# Patient Record
Sex: Female | Born: 1945 | Race: White | Hispanic: No | Smoking: Never smoker
Health system: Southern US, Community
[De-identification: ages and names within clinical notes are randomized; demographics above are authoritative.]

## PROBLEM LIST (undated history)

## (undated) DIAGNOSIS — M51369 Other intervertebral disc degeneration, lumbar region without mention of lumbar back pain or lower extremity pain: Secondary | ICD-10-CM

## (undated) DIAGNOSIS — M549 Dorsalgia, unspecified: Secondary | ICD-10-CM

## (undated) DIAGNOSIS — E119 Type 2 diabetes mellitus without complications: Secondary | ICD-10-CM

## (undated) DIAGNOSIS — R51 Headache: Secondary | ICD-10-CM

## (undated) DIAGNOSIS — M5136 Other intervertebral disc degeneration, lumbar region: Secondary | ICD-10-CM

## (undated) DIAGNOSIS — I4891 Unspecified atrial fibrillation: Secondary | ICD-10-CM

## (undated) DIAGNOSIS — M5134 Other intervertebral disc degeneration, thoracic region: Secondary | ICD-10-CM

## (undated) DIAGNOSIS — M1990 Unspecified osteoarthritis, unspecified site: Secondary | ICD-10-CM

## (undated) DIAGNOSIS — J45909 Unspecified asthma, uncomplicated: Secondary | ICD-10-CM

## (undated) DIAGNOSIS — M5030 Other cervical disc degeneration, unspecified cervical region: Secondary | ICD-10-CM

## (undated) DIAGNOSIS — E785 Hyperlipidemia, unspecified: Secondary | ICD-10-CM

## (undated) DIAGNOSIS — G43909 Migraine, unspecified, not intractable, without status migrainosus: Secondary | ICD-10-CM

## (undated) DIAGNOSIS — I10 Essential (primary) hypertension: Secondary | ICD-10-CM

## (undated) DIAGNOSIS — I350 Nonrheumatic aortic (valve) stenosis: Secondary | ICD-10-CM

## (undated) DIAGNOSIS — G8929 Other chronic pain: Secondary | ICD-10-CM

## (undated) DIAGNOSIS — R569 Unspecified convulsions: Secondary | ICD-10-CM

## (undated) DIAGNOSIS — R011 Cardiac murmur, unspecified: Secondary | ICD-10-CM

## (undated) DIAGNOSIS — I4510 Unspecified right bundle-branch block: Secondary | ICD-10-CM

## (undated) DIAGNOSIS — R519 Headache, unspecified: Secondary | ICD-10-CM

## (undated) HISTORY — PX: TONSILLECTOMY: SUR1361

## (undated) HISTORY — PX: DIAGNOSTIC LAPAROSCOPY: SUR761

## (undated) HISTORY — DX: Essential (primary) hypertension: I10

## (undated) HISTORY — DX: Nonrheumatic aortic (valve) stenosis: I35.0

## (undated) HISTORY — PX: VAGINAL HYSTERECTOMY: SUR661

## (undated) HISTORY — DX: Hyperlipidemia, unspecified: E78.5

## (undated) HISTORY — DX: Unspecified right bundle-branch block: I45.10

## (undated) HISTORY — DX: Unspecified atrial fibrillation: I48.91

---

## 1991-07-28 HISTORY — PX: DIAGNOSTIC LAPAROSCOPY: SUR761

## 1997-07-27 HISTORY — PX: VAGINAL HYSTERECTOMY: SUR661

## 1998-01-15 ENCOUNTER — Other Ambulatory Visit: Admission: RE | Admit: 1998-01-15 | Discharge: 1998-01-15 | Payer: Self-pay | Admitting: Obstetrics and Gynecology

## 1998-04-30 ENCOUNTER — Inpatient Hospital Stay (HOSPITAL_COMMUNITY): Admission: RE | Admit: 1998-04-30 | Discharge: 1998-05-02 | Payer: Self-pay | Admitting: Obstetrics and Gynecology

## 1998-11-13 ENCOUNTER — Encounter: Payer: Self-pay | Admitting: *Deleted

## 1998-11-13 ENCOUNTER — Inpatient Hospital Stay (HOSPITAL_COMMUNITY): Admission: EM | Admit: 1998-11-13 | Discharge: 1998-11-15 | Payer: Self-pay | Admitting: Internal Medicine

## 1998-11-21 ENCOUNTER — Ambulatory Visit (HOSPITAL_COMMUNITY): Admission: RE | Admit: 1998-11-21 | Discharge: 1998-11-21 | Payer: Self-pay | Admitting: *Deleted

## 1999-09-09 ENCOUNTER — Other Ambulatory Visit: Admission: RE | Admit: 1999-09-09 | Discharge: 1999-09-09 | Payer: Self-pay | Admitting: Obstetrics and Gynecology

## 2000-04-15 ENCOUNTER — Encounter: Admission: RE | Admit: 2000-04-15 | Discharge: 2000-07-14 | Payer: Self-pay | Admitting: Anesthesiology

## 2000-09-14 ENCOUNTER — Other Ambulatory Visit: Admission: RE | Admit: 2000-09-14 | Discharge: 2000-09-14 | Payer: Self-pay | Admitting: Obstetrics and Gynecology

## 2006-03-05 ENCOUNTER — Encounter: Admission: RE | Admit: 2006-03-05 | Discharge: 2006-03-05 | Payer: Self-pay | Admitting: Family Medicine

## 2007-06-15 ENCOUNTER — Encounter: Admission: RE | Admit: 2007-06-15 | Discharge: 2007-06-15 | Payer: Self-pay | Admitting: Family Medicine

## 2008-09-14 ENCOUNTER — Encounter: Admission: RE | Admit: 2008-09-14 | Discharge: 2008-09-14 | Payer: Self-pay | Admitting: Family Medicine

## 2009-10-04 ENCOUNTER — Encounter: Admission: RE | Admit: 2009-10-04 | Discharge: 2009-10-04 | Payer: Self-pay | Admitting: Family Medicine

## 2010-11-03 ENCOUNTER — Other Ambulatory Visit: Payer: Self-pay | Admitting: Family Medicine

## 2010-11-03 DIAGNOSIS — Z1231 Encounter for screening mammogram for malignant neoplasm of breast: Secondary | ICD-10-CM

## 2010-11-18 ENCOUNTER — Ambulatory Visit
Admission: RE | Admit: 2010-11-18 | Discharge: 2010-11-18 | Disposition: A | Discharge status: Home / Self Care | Payer: Medicare Other | Source: Ambulatory Visit | Attending: Family Medicine | Admitting: Family Medicine

## 2010-11-18 DIAGNOSIS — Z1231 Encounter for screening mammogram for malignant neoplasm of breast: Secondary | ICD-10-CM

## 2010-12-12 NOTE — Procedures (Signed)
[facility]  Patient:    Julia Rose, Julia Rose                MRN: [id number] Proc. Date: 04/16/00 Adm. Date:  [id number] Attending:  Thyra Breed CC:         Jearld Adjutant, M.D.   Procedure Report  PREOPERATIVE DIAGNOSIS:  Cervical spondylosis.  POSTOPERATIVE DIAGNOSIS:  Cervical spondylosis.  PROCEDURE:  Cervical epidural steroid injection.  SURGEON:  Thyra Breed, M.D.  INTERVAL HISTORY:  The patient is a very pleasant 65 year old who was sent to [place] by Dr. Doristine Section for a cervical epidural steroid injection.  The patient states that she was in her usual state of health up until about six or seven years ago, when she noted the gradual onset of neck discomfort which gradually progressed to include discomfort into her shoulders and upper arms with decreased strength, and numbness and tingling on the volar aspect of both arms, and into the hands with the right being worse than the left.  At some point, she apparently had some nerve conduction studies which suggested carpal tunnel syndrome.  Years ago, she was treated with cervical traction and it improved for a while.  She had an MRI done six to seven years ago which showed two level disk bulges and facet joint arthropathy, and more recently had a repeat MRI done after seeing Dr. Renae Fickle which showed C4-5 moderate broad-based spondylitic protrusion with mild foraminal stenosis, C5-6 with broad-base with effaces the dural sac and impinges on the anterior aspect of the cervical cord, C6-7 broad-based spondylitic protrusion.  The patient in addition to the neck and shoulder discomfort has developed fairly significant problems with headaches for which she uses Midrin.  She has been tried on Fiorinal and prednisone in the past with no overall improvement, and was on ________ up until recently.  She described the pain as a nagging, deep aching discomfort which is made worse by lifting, vacuuming,  sweeping, looking up, or carrying objects above her head.  She notes that rest and relaxation would produce her discomfort.  More recently, she has been placed on Neurontin for the past three weeks, and notes a 25% reduction in her pain with much of the burning discomfort going away.  She has been wearing a soft cervical brace at night which has helped to reduce some of her discomfort. She had used cervical traction up until a couple of years ago, and has not used this recently.  She has been on Celebrex, which she does not feel is very helpful.  CURRENT MEDICATIONS:  Midrin, Neurontin, Paxil, Premarin, and phenobarbital for seizures, Allegra, hydrochlorothiazide, Celebrex, and Caltrate with multivitamins.  ALLERGIES:  DILANTIN AND FLEXERIL.  FAMILY HISTORY:  Positive for hypertension, coronary artery disease, and diabetes.  PAST SURGICAL HISTORY:  Significant for tonsillectomy and adenoidectomy, hysterectomy, and laparoscopic surgery for paraovarian cyst.  SOCIAL HISTORY:  The patient is a nonsmoker, rare drinker, and works as a Firefighter for _______.  ACTIVE MEDICAL PROBLEMS:  Include migraine headaches, depression, osteoarthritis, allergic rhinitis, and seizure disorder.  REVIEW OF SYSTEMS:  General negative.  Head - see HPI.  Eyes significant for corrective lens.  Nose, mouth, and throat significant for allergic rhinitis symptoms.  Ears significant for scar tissue.  Pulmonary negative. Cardiovascular negative except for intermittent edema for which she takes her hydrochlorothiazide.  GI - she has some mild constipation.  GU negative. Musculoskeletal and neurologic see HPI.  The patient does have a history of seizure  disorder for which she is on phenobarbital by her primary care physician.  Hematologic negative.  Cutaneous negative.  Endocrine negative. Psychiatric significant for depression.  Allergy and immunologic significant for allergic rhinitis.  PHYSICAL EXAMINATION:   Blood pressure 130/72, heart rate 76, respiratory rate 14, O2 saturations 96%.  Pain level is 5 out of 10.  Temperature is 97.5.  In general, this is a very pleasant female in no acute distress.  Head was normocephalic and atraumatic.  Eyes - extraocular movements intact with conjunctivae and sclerae clear.  Conjunctivae and sclerae clear.  Nose patent and nares without discharge.  Oropharynx is free of lesions.  Neck was significant for 20-30 degrees of extension with intact forward flexion and rotation to right and left, as well as lateral flexion.  Spurlings sign was negative.  Carotids are 2+ and symmetric without bruits.  There is no lymphadenopathy.  Lungs were clear.  Heart was regular rate and rhythm. Breasts, abdomen, pelvic, and rectal exams were not performed.  The patient demonstrated no cyanosis, clubbing, or edema, with radial pulse, and dorsalis pedis pulses 2+ and symmetric.  Neurologic - the patient was oriented x 4. Cranial nerves 2-12 are grossly intact.  Deep tendon reflexes were 2+ and symmetric in the upper and lower extremity with downgoing toes.  Motor was 5/5 with symmetric bulk and tone.  Sensory was intact to pinprick and vibratory sense.  Coordination was grossly intact.  IMPRESSION: 1. 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 2. Multiple other medical problems per primary care physician which include    depression, seizure disorder, migraine headaches, osteoarthritis, allergic    rhinitis, mild constipation.  DISPOSITION:  I discussed with the patient in detail the potential risks and benefit ratio of a cervical epidural steroid injection.  She is aware of the side effects of corticosteroids.  DESCRIPTION OF PROCEDURE:  After informed consent was obtained, the patient was placed in the right lateral decubitus position and monitored.  Her neck was prepped with Betadine x 3.  The skin level was raised at the  C7-T1 interspace with 1% lidocaine.  A 20-gauge Tuohy needle was introduced into the  cervical epidural space with a loss of resistance to preservative-free normal saline.  There was no CSF nor blood.  Forty milligrams of Medrol and 3 cc of preservative-free normal saline was gently injected.  The needle was flushed with preservative-free normal saline and removed intact.  POSTPROCEDURE CONDITION:  Stable.  DISCHARGE INSTRUCTIONS: 1. Resume previous diet. 2. Limitation and activities per instruction sheet. 3. Continue on current medications. 4. The patient was advised that current literature supports the benefits of    only one cervical epidural steroid injection.  There is very little added    benefit to doing more than one in a series contrary to what is done in the    lumbar epidural series.  I discussed this with the patient.  I will see the    patient back at Dr. Philipp Ovens request, but anticipate that she will probably    get optimal benefits from the single shot and would likely need to come    back in six months if she does get a positive benefit. DD:  04/16/00 TD:  04/19/00 Job: [id number]

## 2012-04-04 ENCOUNTER — Other Ambulatory Visit: Payer: Self-pay | Admitting: Family Medicine

## 2012-04-04 DIAGNOSIS — Z1231 Encounter for screening mammogram for malignant neoplasm of breast: Secondary | ICD-10-CM

## 2012-04-22 ENCOUNTER — Ambulatory Visit: Payer: Medicare Other

## 2012-04-25 ENCOUNTER — Ambulatory Visit
Admission: RE | Admit: 2012-04-25 | Discharge: 2012-04-25 | Disposition: A | Discharge status: Home / Self Care | Payer: Medicare Other | Source: Ambulatory Visit | Attending: Family Medicine | Admitting: Family Medicine

## 2012-04-25 DIAGNOSIS — Z1231 Encounter for screening mammogram for malignant neoplasm of breast: Secondary | ICD-10-CM

## 2013-06-16 ENCOUNTER — Other Ambulatory Visit: Payer: Self-pay

## 2013-06-16 DIAGNOSIS — Z1231 Encounter for screening mammogram for malignant neoplasm of breast: Secondary | ICD-10-CM

## 2013-07-13 ENCOUNTER — Ambulatory Visit
Admission: RE | Admit: 2013-07-13 | Discharge: 2013-07-13 | Disposition: A | Discharge status: Home / Self Care | Payer: Medicare Other | Source: Ambulatory Visit

## 2013-07-13 DIAGNOSIS — Z1231 Encounter for screening mammogram for malignant neoplasm of breast: Secondary | ICD-10-CM

## 2014-04-09 ENCOUNTER — Other Ambulatory Visit: Payer: Self-pay | Admitting: Family Medicine

## 2014-04-09 DIAGNOSIS — E2839 Other primary ovarian failure: Secondary | ICD-10-CM

## 2014-04-27 ENCOUNTER — Other Ambulatory Visit: Payer: Medicare Other

## 2014-05-22 ENCOUNTER — Ambulatory Visit
Admission: RE | Admit: 2014-05-22 | Discharge: 2014-05-22 | Disposition: A | Discharge status: Home / Self Care | Payer: Medicare Other | Source: Ambulatory Visit | Attending: Family Medicine | Admitting: Family Medicine

## 2014-05-22 DIAGNOSIS — E2839 Other primary ovarian failure: Secondary | ICD-10-CM

## 2014-06-19 ENCOUNTER — Other Ambulatory Visit: Payer: Self-pay

## 2014-06-19 DIAGNOSIS — Z1231 Encounter for screening mammogram for malignant neoplasm of breast: Secondary | ICD-10-CM

## 2014-07-31 ENCOUNTER — Ambulatory Visit
Admission: RE | Admit: 2014-07-31 | Discharge: 2014-07-31 | Disposition: A | Discharge status: Home / Self Care | Payer: Medicare Other | Source: Ambulatory Visit

## 2014-07-31 DIAGNOSIS — Z1231 Encounter for screening mammogram for malignant neoplasm of breast: Secondary | ICD-10-CM

## 2014-12-13 ENCOUNTER — Other Ambulatory Visit: Payer: Self-pay | Admitting: General Surgery

## 2014-12-13 DIAGNOSIS — K439 Ventral hernia without obstruction or gangrene: Secondary | ICD-10-CM

## 2014-12-18 ENCOUNTER — Ambulatory Visit
Admission: RE | Admit: 2014-12-18 | Discharge: 2014-12-18 | Disposition: A | Discharge status: Home / Self Care | Payer: Medicare Other | Source: Ambulatory Visit | Attending: General Surgery | Admitting: General Surgery

## 2014-12-18 MED ORDER — IOHEXOL 300 MG/ML  SOLN
100.0000 mL | Freq: Once | INTRAMUSCULAR | Status: AC | PRN
  Administered 2014-12-18: 100 mL via INTRAVENOUS

## 2015-05-03 ENCOUNTER — Telehealth: Payer: Self-pay | Admitting: Cardiovascular Disease

## 2015-05-03 NOTE — Telephone Encounter (Signed)
05/03/2015 Received incoming faxed records for upcoming appointment on 06/14/2015 with Dr. Allyson Sabal from [facility].  Records given to [facility]. cbr

## 2015-06-12 ENCOUNTER — Ambulatory Visit: Payer: Medicare Other | Admitting: Cardiovascular Disease

## 2015-07-02 ENCOUNTER — Ambulatory Visit: Payer: Medicare Other | Admitting: Cardiovascular Disease

## 2015-07-09 ENCOUNTER — Encounter: Payer: Self-pay | Admitting: Cardiovascular Disease

## 2015-07-09 ENCOUNTER — Ambulatory Visit (INDEPENDENT_AMBULATORY_CARE_PROVIDER_SITE_OTHER): Payer: Medicare Other | Admitting: Cardiovascular Disease

## 2015-07-09 ENCOUNTER — Telehealth: Payer: Self-pay | Admitting: Cardiovascular Disease

## 2015-07-09 DIAGNOSIS — R0789 Other chest pain: Secondary | ICD-10-CM | POA: Diagnosis not present

## 2015-07-09 DIAGNOSIS — R079 Chest pain, unspecified: Secondary | ICD-10-CM | POA: Insufficient documentation

## 2015-07-09 DIAGNOSIS — I4510 Unspecified right bundle-branch block: Secondary | ICD-10-CM | POA: Insufficient documentation

## 2015-07-09 DIAGNOSIS — R011 Cardiac murmur, unspecified: Secondary | ICD-10-CM

## 2015-07-09 DIAGNOSIS — R002 Palpitations: Secondary | ICD-10-CM | POA: Diagnosis not present

## 2015-07-09 DIAGNOSIS — I10 Essential (primary) hypertension: Secondary | ICD-10-CM

## 2015-07-09 DIAGNOSIS — I481 Persistent atrial fibrillation: Secondary | ICD-10-CM

## 2015-07-09 DIAGNOSIS — E119 Type 2 diabetes mellitus without complications: Secondary | ICD-10-CM | POA: Insufficient documentation

## 2015-07-09 DIAGNOSIS — R0989 Other specified symptoms and signs involving the circulatory and respiratory systems: Secondary | ICD-10-CM | POA: Diagnosis not present

## 2015-07-09 DIAGNOSIS — I480 Paroxysmal atrial fibrillation: Secondary | ICD-10-CM | POA: Insufficient documentation

## 2015-07-09 DIAGNOSIS — E785 Hyperlipidemia, unspecified: Secondary | ICD-10-CM | POA: Insufficient documentation

## 2015-07-09 LAB — BASIC METABOLIC PANEL
BUN: 15 mg/dL (ref 7–25)
CO2: 27 mmol/L (ref 20–31)
Calcium: 10.1 mg/dL (ref 8.6–10.4)
Chloride: 100 mmol/L (ref 98–110)
Creat: 0.85 mg/dL (ref 0.50–0.99)
GLUCOSE: 98 mg/dL (ref 65–99)
Potassium: 4.3 mmol/L (ref 3.5–5.3)
SODIUM: 141 mmol/L (ref 135–146)

## 2015-07-09 MED ORDER — EDOXABAN TOSYLATE 30 MG PO TABS: 30.0000 mg | ORAL_TABLET | Freq: Every day | ORAL | Status: DC

## 2015-07-09 NOTE — Patient Instructions (Signed)
Medication Instructions:  Your physician has recommended you make the following change in your medication:  1) START Savaysa 30 mg by mouth ONCE daily   Labwork: Your physician recommends that you return for lab work in: BMET The lab can be found on the FIRST FLOOR of out building in [address]  Testing/Procedures: Your physician has requested that you have an echocardiogram. Echocardiography is a painless test that uses sound waves to create images of your heart. It provides your doctor with information about the size and shape of your heart and how well your heart's chambers and valves are working. This procedure takes approximately one hour. There are no restrictions for this procedure.  Your physician has requested that you have a carotid duplex. This test is an ultrasound of the carotid arteries in your neck. It looks at blood flow through these arteries that supply the brain with blood. Allow one hour for this exam. There are no restrictions or special instructions.  Your physician has requested that you have a lexiscan myoview. For further information please visit [email or web address]. Please follow instruction sheet, as given.  Your physician has recommended that you wear an event monitor. Event monitors are medical devices that record the heart's electrical activity. Doctors most often us these monitors to diagnose arrhythmias. Arrhythmias are problems with the speed or rhythm of the heartbeat. The monitor is a small, portable device. You can wear one while you do your normal daily activities. This is usually used to diagnose what is causing palpitations/syncope (passing out). - 2 weeks    Follow-Up: Your physician recommends that you schedule a follow-up appointment in: with Dr. Allyson SabalBerry 1 week after competition of event monitor    Any Other Special Instructions Will Be Listed Below (If Applicable).     If you need a refill on your cardiac medications before your next appointment,  please call your pharmacy.

## 2015-07-09 NOTE — Assessment & Plan Note (Signed)
History of hypertension with blood pressure measured at 136/90. She is on chlorothiazide and losartan. Continue current meds at current dosing

## 2015-07-09 NOTE — Assessment & Plan Note (Signed)
Mrs. Julia Rose has newly recognized A. Fib with ventricular response in the 90s. She is relatively asymptomatic from this.Her CHA2DSVASC2 score is  4. I'm going to begin her on Eliquis  oral antibiotic regulation after checking her renal function. .I'm also going to obtain a 2-D echocardiogram.

## 2015-07-09 NOTE — Assessment & Plan Note (Signed)
History of hyperlipidemia intolerant to statin drugs 

## 2015-07-09 NOTE — Telephone Encounter (Signed)
Received call from Julia Rose with [facility] calling to report AFib with a rate of 110 BPM.Dr.Berry aware patient has AFib.

## 2015-07-09 NOTE — Progress Notes (Signed)
07/09/2015 [name]   Jun 21, 1946  [id number]  Primary Physician Kaleen Mask, MD Primary Cardiologist: Runell Gess MD [name]   HPI:  Mrs. Hoggard is a 69 year old moderately overweight married Caucasian female mother of 4 children, grandmother of a grandchildren whose husband [name] is also a patient of mine. She was referred by Dr. Jeannetta Nap for evaluation of recently recognized atrial fibrillation. Her cardiac risk factor profile is notable for treated hypertension, diabetes. She has hyperlipidemia intolerant to statin drugs. She's never had a heart stroke. She occasionally gets chest tightness. 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 She was recently found to be in A. Fib during a routine doctor's visit and was referred here for further evaluation.   Current Outpatient Prescriptions  Medication Sig Dispense Refill  . Aspirin-Acetaminophen-Caffeine (EXCEDRIN PO) Take 1 tablet by mouth daily.    . hydrochlorothiazide (HYDRODIURIL) 25 MG tablet Take 25 mg by mouth daily.    Marland Kitchen ibuprofen (ADVIL,MOTRIN) 200 MG tablet Take 200 mg by mouth every 6 (six) hours as needed. TAKE 1-3 TABLETS PRN.    Marland Kitchen losartan (COZAAR) 100 MG tablet Take 50 mg by mouth at bedtime.  0  . metFORMIN (GLUMETZA) 1000 MG (MOD) 24 hr tablet Take 1,000 mg by mouth daily with breakfast.    . PARoxetine (PAXIL) 40 MG tablet Take 40 mg by mouth daily.  0  . PHENobarbital (LUMINAL) 97.2 MG tablet Take 1 tablet by mouth at bedtime.  4   No current facility-administered medications for this visit.    Allergies  Allergen Reactions  . Dilantin [Phenytoin] Hives    Whelps all over for two weeks    Social History   Social History  . Marital Status: Married    Spouse Name: N/A  . Number of Children: N/A  . Years of Education: N/A   Occupational History  . Not on file.   Social History Main Topics  . Smoking status: Never Smoker   . Smokeless tobacco:  Never Used  . Alcohol Use: No  . Drug Use: No  . Sexual Activity: Not on file   Other Topics Concern  . Not on file   Social History Narrative  . No narrative on file     Review of Systems: General: negative for chills, fever, night sweats or weight changes.  Cardiovascular: negative for chest pain, dyspnea on exertion, edema, orthopnea, palpitations, paroxysmal nocturnal dyspnea or shortness of breath Dermatological: negative for rash Respiratory: negative for cough or wheezing Urologic: negative for hematuria Abdominal: negative for nausea, vomiting, diarrhea, bright red blood per rectum, melena, or hematemesis Neurologic: negative for visual changes, syncope, or dizziness All other systems reviewed and are otherwise negative except as noted above.    Blood pressure 136/90, pulse 95, height  (1.575 m), weight 187 lb (84.823 kg).  General appearance: alert and no distress Neck: no adenopathy, no JVD, supple, symmetrical, trachea midline, thyroid not enlarged, symmetric, no tenderness/mass/nodules and soft bilateral carotid bruits Lungs: clear to auscultation bilaterally Heart: irregularly irregular rhythm Extremities: extremities normal, atraumatic, no cyanosis or edema  EKG a total fibrillation with a ventricular response of 95 and right bundle branch block. I personally reviewed this EKG  ASSESSMENT AND PLAN:   Atrial fibrillation [facility]) Mrs. Hazelett has newly recognized A. Fib with ventricular response in the 90s. She is relatively asymptomatic from this.Her CHA2DSVASC2 score is  4. I'm going to begin her on Eliquis  oral antibiotic regulation after checking  her renal function. .I'm also going to obtain a 2-D echocardiogram.  Chest pain Review of intermittent chest pressure with positive risk factors and newly recognized A. Fib. I'm going to obtain a formal neurologic Myoview stress test for risk stratification.  Hyperlipidemia History of hyperlipidemia intolerant to  statin drugs  Essential hypertension History of hypertension with blood pressure measured at 136/90. She is on chlorothiazide and losartan. Continue current meds at current dosing      Runell GessJonathan J. Elisabel Hanover MD [facility] HospitalFACP,FACC,FAHA, [facility] CenterFSCAI 07/09/2015 12:21 PM

## 2015-07-09 NOTE — Assessment & Plan Note (Signed)
Review of intermittent chest pressure with positive risk factors and newly recognized A. Fib. I'm going to obtain a formal neurologic Myoview stress test for risk stratification.

## 2015-07-10 DIAGNOSIS — R002 Palpitations: Secondary | ICD-10-CM | POA: Diagnosis not present

## 2015-07-12 ENCOUNTER — Telehealth: Payer: Self-pay | Admitting: Cardiovascular Disease

## 2015-07-12 NOTE — Telephone Encounter (Signed)
Received call for HHR A Fib alert - autotrigger rate of 116 BPM w/ no symptoms recorded.  Awaiting fax - have not received this yet.  Pt had HHR autotrigger event on day of monitor initiation 12/13. Same day of OV.  New onset of A Fib already noted by Dr. Allyson SabalBerry. She is on anticoagulation.  Routing to Dr. Antoine PocheHochrein ([employer]) for any recommendations.

## 2015-07-15 ENCOUNTER — Encounter: Payer: Self-pay | Admitting: Cardiovascular Disease

## 2015-07-15 NOTE — Telephone Encounter (Signed)
I will forward to Dr. Allyson SabalBerry.

## 2015-07-16 ENCOUNTER — Telehealth: Payer: Self-pay | Admitting: *Deleted

## 2015-07-16 NOTE — Telephone Encounter (Signed)
Pt has atrial fib with RVR on monitor, per dr berry, pt will need to see the atrial fib clinic. Pt aware Julia Rose will call to schedule the appt. Left message for Julia Rose at the atrial fib clinic to call and schedule the appt.

## 2015-07-16 NOTE — Telephone Encounter (Signed)
We already know that she is in AFIB

## 2015-07-17 NOTE — Telephone Encounter (Signed)
Per verbal orders on 12/20 from Dr. Allyson SabalBerry; pt setup with A. Fib clinic on 12/27 @ 1:30pm.

## 2015-07-23 ENCOUNTER — Ambulatory Visit (HOSPITAL_COMMUNITY)
Admission: RE | Admit: 2015-07-23 | Discharge: 2015-07-23 | Disposition: A | Discharge status: Home / Self Care | Payer: Medicare Other | Source: Ambulatory Visit | Attending: Nurse Practitioner | Admitting: Nurse Practitioner

## 2015-07-23 ENCOUNTER — Encounter (HOSPITAL_COMMUNITY): Payer: Self-pay | Admitting: Nurse Practitioner

## 2015-07-23 VITALS — BP 138/72 | HR 83 | Ht 62.0 in | Wt 192.8 lb

## 2015-07-23 DIAGNOSIS — I480 Paroxysmal atrial fibrillation: Secondary | ICD-10-CM

## 2015-07-23 DIAGNOSIS — I10 Essential (primary) hypertension: Secondary | ICD-10-CM | POA: Insufficient documentation

## 2015-07-23 MED ORDER — METOPROLOL TARTRATE 25 MG PO TABS: ORAL_TABLET | ORAL | Status: DC

## 2015-07-23 NOTE — Progress Notes (Signed)
Patient ID: Julia Rose, female   DOB: 01-29-1946, 69 y.o.   MRN: [id number]     Primary Care Physician: Kaleen Mask, MD Referring Physician: Dr. Matt Holmes SCOTTIE Julia Rose is a 69 y.o. female with a recent h/o of PAF. She is referred by Dr. Allyson Sabal for further evaluation. She is pending an echo and a stress test. The palpitations come and go usually daily but do not last that long. She had been started on edoxaban 30 mg daily for a chadsvasc score of 4. She is aware of fatigue, shortness of breath and chest heaviness when in afib. 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   Today, she denies symptoms of palpitations, chest pain, shortness of breath, orthopnea, PND, lower extremity edema, dizziness, presyncope, syncope, or neurologic sequela. The patient is tolerating medications without difficulties and is otherwise without complaint today.   Past Medical History  Diagnosis Date  . Atrial fibrillation (HCC)   . Right bundle branch block   . Hypertension   . Hyperlipidemia     statin intolerant  . Diabetes (HCC)    No past surgical history on file.  Current Outpatient Prescriptions  Medication Sig Dispense Refill  . edoxaban (SAVAYSA) 30 MG TABS tablet Take 1 tablet (30 mg total) by mouth daily. 90 tablet 3  . hydrochlorothiazide (HYDRODIURIL) 25 MG tablet Take 25 mg by mouth daily.    Marland Kitchen losartan (COZAAR) 100 MG tablet Take 50 mg by mouth at bedtime.  0  . metFORMIN (GLUMETZA) 1000 MG (MOD) 24 hr tablet Take 1,000 mg by mouth daily with breakfast.    . PARoxetine (PAXIL) 40 MG tablet Take 40 mg by mouth daily.  0  . PHENobarbital (LUMINAL) 97.2 MG tablet Take 1 tablet by mouth at bedtime.  4  . metoprolol tartrate (LOPRESSOR) 25 MG tablet Take 1 tablet every 4-6 hours AS NEEDED for afib episode if HR >100 30 tablet 1   No current facility-administered medications for this encounter.    Allergies  Allergen Reactions  . Dilantin [Phenytoin] Hives    Whelps all over for  two weeks    Social History   Social History  . Marital Status: Married    Spouse Name: N/A  . Number of Children: N/A  . Years of Education: N/A   Occupational History  . Not on file.   Social History Main Topics  . Smoking status: Never Smoker   . Smokeless tobacco: Never Used  . Alcohol Use: No  . Drug Use: No  . Sexual Activity: Not on file   Other Topics Concern  . Not on file   Social History Narrative    Family History  Problem Relation Age of Onset  . Kidney disease Mother   . Diabetes Mother   . Dementia Mother   . Hypertension Mother     ROS- All systems are reviewed and negative except as per the HPI above  Physical Exam: Filed Vitals:   07/23/15 1326  BP: 138/72  Pulse: 83  Height:  (1.575 m)  Weight: 192 lb 12.8 oz (87.454 kg)    GEN- The patient is well appearing, alert and oriented x 3 today.   Head- normocephalic, atraumatic Eyes-  Sclera clear, conjunctiva pink Ears- hearing intact Oropharynx- clear Neck- supple, no JVP, soft bruits, rt louder than left,? Possible radiation of systolic murmur Lymph- no cervical lymphadenopathy Lungs- Clear to ausculation bilaterally, normal work of breathing Heart- Regular rate and rhythm, 2/6 systolic murmur  rubs or gallops, PMI not laterally displaced GI- soft, NT, ND, + BS Extremities- no clubbing, cyanosis, or edema MS- no significant deformity or atrophy Skin- no rash or lesion Psych- euthymic mood, full affect Neuro- strength and sensation are intact  EKG- NSR, RBBB, Pr int 134 ms, QRS int 146 ms, QTc 477 ms Epic records reviewed  Assessment and Plan: 1.PAF Metoprolol tartrate 25 mg pill in pocket given for episodes of fast heart rate Pt is currently on Edoxaban 30 mg daily, crcl calculated at 85.9. I do not see a clear  indication for dose adjustment as the usual dose would be 60 mg a day. I will message Dr. Allyson SabalBerry to clarify. His note actually said he planned to start eliquis. 2D  echo/myoview pending  2. HTN Stable  F/u in 2 weeks after results of tests are in, for further ratre/rhythm management.  Elvina Sidleonna C. Matthew Folksarroll, ANP-C Afib Clinic [facility] [place] [address] [place], [place] [address] [phone]

## 2015-07-23 NOTE — Patient Instructions (Signed)
Your physician has recommended you make the following change in your medication:  1)Metoprolol (lopressor) 25mg  -- take 1 tablet every 4-6 hours AS NEEDED for afib episode for heart rate greater than 100

## 2015-07-24 ENCOUNTER — Ambulatory Visit (INDEPENDENT_AMBULATORY_CARE_PROVIDER_SITE_OTHER): Payer: Medicare Other

## 2015-07-24 DIAGNOSIS — R002 Palpitations: Secondary | ICD-10-CM | POA: Diagnosis not present

## 2015-07-26 ENCOUNTER — Telehealth (HOSPITAL_COMMUNITY): Payer: Self-pay

## 2015-07-26 NOTE — Telephone Encounter (Signed)
Encounter complete. 

## 2015-07-31 ENCOUNTER — Ambulatory Visit (HOSPITAL_COMMUNITY)
Admission: RE | Admit: 2015-07-31 | Discharge: 2015-07-31 | Disposition: A | Discharge status: Home / Self Care | Payer: Medicare Other | Source: Ambulatory Visit | Attending: Cardiovascular Disease | Admitting: Cardiovascular Disease

## 2015-07-31 ENCOUNTER — Ambulatory Visit (HOSPITAL_BASED_OUTPATIENT_CLINIC_OR_DEPARTMENT_OTHER)
Admission: RE | Admit: 2015-07-31 | Discharge: 2015-07-31 | Disposition: A | Discharge status: Home / Self Care | Payer: Medicare Other | Source: Ambulatory Visit | Attending: Cardiovascular Disease | Admitting: Cardiovascular Disease

## 2015-07-31 DIAGNOSIS — E785 Hyperlipidemia, unspecified: Secondary | ICD-10-CM | POA: Diagnosis not present

## 2015-07-31 DIAGNOSIS — R011 Cardiac murmur, unspecified: Secondary | ICD-10-CM | POA: Diagnosis not present

## 2015-07-31 DIAGNOSIS — I4510 Unspecified right bundle-branch block: Secondary | ICD-10-CM | POA: Diagnosis not present

## 2015-07-31 DIAGNOSIS — R002 Palpitations: Secondary | ICD-10-CM

## 2015-07-31 DIAGNOSIS — E119 Type 2 diabetes mellitus without complications: Secondary | ICD-10-CM | POA: Diagnosis not present

## 2015-07-31 DIAGNOSIS — I4891 Unspecified atrial fibrillation: Secondary | ICD-10-CM | POA: Insufficient documentation

## 2015-07-31 DIAGNOSIS — R0989 Other specified symptoms and signs involving the circulatory and respiratory systems: Secondary | ICD-10-CM | POA: Diagnosis not present

## 2015-07-31 DIAGNOSIS — I10 Essential (primary) hypertension: Secondary | ICD-10-CM | POA: Diagnosis not present

## 2015-07-31 DIAGNOSIS — R0789 Other chest pain: Secondary | ICD-10-CM

## 2015-07-31 LAB — MYOCARDIAL PERFUSION IMAGING
CHL CUP NUCLEAR SSS: 5
LV dias vol: 68 mL
LVSYSVOL: 18 mL
NUC STRESS TID: 1.34
Peak HR: 102 {beats}/min
Rest HR: 69 {beats}/min
SDS: 3
SRS: 2

## 2015-07-31 MED ORDER — REGADENOSON 0.4 MG/5ML IV SOLN
0.4000 mg | Freq: Once | INTRAVENOUS | Status: AC
  Administered 2015-07-31: 0.4 mg via INTRAVENOUS

## 2015-07-31 MED ORDER — TECHNETIUM TC 99M SESTAMIBI GENERIC - CARDIOLITE
10.9000 | Freq: Once | INTRAVENOUS | Status: AC | PRN
  Administered 2015-07-31: 10.9 via INTRAVENOUS

## 2015-07-31 MED ORDER — TECHNETIUM TC 99M SESTAMIBI GENERIC - CARDIOLITE
32.2000 | Freq: Once | INTRAVENOUS | Status: AC | PRN
  Administered 2015-07-31: 32.2 via INTRAVENOUS

## 2015-07-31 MED ORDER — AMINOPHYLLINE 25 MG/ML IV SOLN
75.0000 mg | Freq: Once | INTRAVENOUS | Status: AC
  Administered 2015-07-31: 75 mg via INTRAVENOUS

## 2015-08-02 ENCOUNTER — Ambulatory Visit: Payer: Medicare Other | Admitting: Cardiovascular Disease

## 2015-08-07 ENCOUNTER — Telehealth: Payer: Self-pay

## 2015-08-07 DIAGNOSIS — R011 Cardiac murmur, unspecified: Secondary | ICD-10-CM

## 2015-08-07 NOTE — Telephone Encounter (Signed)
-----   Message from Runell GessJonathan J Berry, MD sent at 07/31/2015  1:18 PM EST ----- Normal LV systolic function, mild to moderate aortic stenosis. Repeat 12 months

## 2015-08-09 ENCOUNTER — Encounter (HOSPITAL_COMMUNITY): Payer: Self-pay | Admitting: Nurse Practitioner

## 2015-08-09 ENCOUNTER — Ambulatory Visit (HOSPITAL_COMMUNITY)
Admission: RE | Admit: 2015-08-09 | Discharge: 2015-08-09 | Disposition: A | Discharge status: Home / Self Care | Payer: Medicare Other | Source: Ambulatory Visit | Attending: Nurse Practitioner | Admitting: Nurse Practitioner

## 2015-08-09 VITALS — BP 132/84 | HR 78 | Ht 62.0 in | Wt 193.6 lb

## 2015-08-09 DIAGNOSIS — I10 Essential (primary) hypertension: Secondary | ICD-10-CM | POA: Diagnosis not present

## 2015-08-09 DIAGNOSIS — I480 Paroxysmal atrial fibrillation: Secondary | ICD-10-CM

## 2015-08-09 MED ORDER — METOPROLOL TARTRATE 25 MG PO TABS: 12.5000 mg | ORAL_TABLET | Freq: Every day | ORAL | Status: DC

## 2015-08-09 MED ORDER — EDOXABAN TOSYLATE 60 MG PO TABS: 30.0000 mg | ORAL_TABLET | Freq: Every day | ORAL | Status: DC

## 2015-08-09 NOTE — Progress Notes (Signed)
Patient ID: Julia Rose, female   DOB: 09/20/1945, 70 y.o.   MRN: [id number]     Primary Care Physician: Kaleen Mask, MD Referring Physician: Dr. Matt Holmes Julia Rose is a 70 y.o. female with a recent h/o of PAF. She is referred by Dr. Allyson Sabal for further evaluation. She is pending an echo and a stress test. The palpitations come and go usually daily but do not last that long. She had been started on edoxaban 30 mg daily for a chadsvasc score of 4. She is aware of fatigue, shortness of breath and chest heaviness when in afib. 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   Today, on f/u in afib clinic, she reports one sustained afib episode for several hours, a few smaller ones.  Overall, not bothering her enough to consider an antiarrythmic, but may do better if bb daily. She is very conservative toward medicine.contiuing endoxaban using this due to being most compatible  with pentaobarbital, but for her kidney function should be on 60 mg daily.  Today, she denies symptoms of palpitations, chest pain, shortness of breath, orthopnea, PND, lower extremity edema, dizziness, presyncope, syncope, or neurologic sequela. The patient is tolerating medications without difficulties and is otherwise without complaint today.   Past Medical History  Diagnosis Date  . Atrial fibrillation (HCC)   . Right bundle branch block   . Hypertension   . Hyperlipidemia     statin intolerant  . Diabetes (HCC)    No past surgical history on file.  Current Outpatient Prescriptions  Medication Sig Dispense Refill  . edoxaban (SAVAYSA) 60 MG TABS tablet Take 30 mg by mouth daily. 30 tablet 6  . hydrochlorothiazide (HYDRODIURIL) 25 MG tablet Take 25 mg by mouth daily.    Marland Kitchen losartan (COZAAR) 100 MG tablet Take 50 mg by mouth at bedtime.  0  . metFORMIN (GLUMETZA) 1000 MG (MOD) 24 hr tablet Take 1,000 mg by mouth daily with breakfast.    . metoprolol tartrate (LOPRESSOR) 25 MG tablet Take 0.5 tablets (12.5  mg total) by mouth daily. 30 tablet 1  . PARoxetine (PAXIL) 40 MG tablet Take 40 mg by mouth daily.  0  . PHENobarbital (LUMINAL) 97.2 MG tablet Take 1 tablet by mouth at bedtime.  4   No current facility-administered medications for this encounter.    Allergies  Allergen Reactions  . Dilantin [Phenytoin] Hives    Whelps all over for two weeks  . Statins Other (See Comments)    Social History   Social History  . Marital Status: Married    Spouse Name: N/A  . Number of Children: N/A  . Years of Education: N/A   Occupational History  . Not on file.   Social History Main Topics  . Smoking status: Never Smoker   . Smokeless tobacco: Never Used  . Alcohol Use: No  . Drug Use: No  . Sexual Activity: Not on file   Other Topics Concern  . Not on file   Social History Narrative    Family History  Problem Relation Age of Onset  . Kidney disease Mother   . Diabetes Mother   . Dementia Mother   . Hypertension Mother     ROS- All systems are reviewed and negative except as per the HPI above  Physical Exam: Filed Vitals:   08/09/15 1039  BP: 132/84  Pulse: 78  Height: 5\' 2"  (1.575 m)  Weight: 193 lb 9.6 oz (87.816 kg)    GEN-  The patient is well appearing, alert and oriented x 3 today.   Head- normocephalic, atraumatic Eyes-  Sclera clear, conjunctiva pink Ears- hearing intact Oropharynx- clear Neck- supple, no JVP, soft bruits, rt louder than left,? Possible radiation of systolic murmur Lymph- no cervical lymphadenopathy Lungs- Clear to ausculation bilaterally, normal work of breathing Heart- Regular rate and rhythm, 2/6 systolic murmur rubs or gallops, PMI not laterally displaced GI- soft, NT, ND, + BS Extremities- no clubbing, cyanosis, or edema MS- no significant deformity or atrophy Skin- no rash or lesion Psych- euthymic mood, full affect Neuro- strength and sensation are intact  EKG- NSR, RBBB, Pr int 134 ms, QRS int 146 ms, QTc 477 ms Epic records  reviewed  Assessment and Plan: 1.PAF Metoprolol tartrate, 1/2 tab daily and can be taken as extra dose by 1/2 to 1 pill for episodes of afib. Pt is currently on Edoxaban 30 mg daily, crcl calculated at 85.9.By kidney function she should be on 60 mg a day and I checked with two separate pharmacists to confirm. Will send in new rx. 2D echo/myoview pending  2. HTN Stable  F/u in march  Donna C. Matthew Folksarroll, ANP-C [facility] [address] [place], [place] [address] [phone]

## 2015-08-09 NOTE — Patient Instructions (Addendum)
Your physician has recommended you make the following change in your medication:  1) Savaysa 60mg  once a day 2)Metoprolol 12.5mg  (1/2 tablet) once a day

## 2015-08-28 ENCOUNTER — Ambulatory Visit (INDEPENDENT_AMBULATORY_CARE_PROVIDER_SITE_OTHER): Payer: Medicare Other | Admitting: Cardiovascular Disease

## 2015-08-28 ENCOUNTER — Encounter: Payer: Self-pay | Admitting: Cardiovascular Disease

## 2015-08-28 VITALS — BP 110/66 | HR 66 | Ht 62.0 in | Wt 192.3 lb

## 2015-08-28 DIAGNOSIS — E785 Hyperlipidemia, unspecified: Secondary | ICD-10-CM

## 2015-08-28 DIAGNOSIS — I480 Paroxysmal atrial fibrillation: Secondary | ICD-10-CM

## 2015-08-28 DIAGNOSIS — I10 Essential (primary) hypertension: Secondary | ICD-10-CM

## 2015-08-28 DIAGNOSIS — R079 Chest pain, unspecified: Secondary | ICD-10-CM | POA: Diagnosis not present

## 2015-08-28 DIAGNOSIS — R002 Palpitations: Secondary | ICD-10-CM | POA: Diagnosis not present

## 2015-08-28 DIAGNOSIS — I350 Nonrheumatic aortic (valve) stenosis: Secondary | ICD-10-CM

## 2015-08-28 MED ORDER — WARFARIN SODIUM 5 MG PO TABS: 5.0000 mg | ORAL_TABLET | Freq: Every day | ORAL | Status: DC

## 2015-08-28 NOTE — Assessment & Plan Note (Signed)
History of chest pain with recent Myoview that was low risk and nonischemic

## 2015-08-28 NOTE — Assessment & Plan Note (Signed)
Paroxysmal atrial fibrillation with recent event monitor showing ventricular response as high as 170. She was started on oral anticoagulation. She has right bundle branch block. LV function was normal. She is on low-dose beta blocker. Today she is in sinus rhythm at 63. Continue current medications

## 2015-08-28 NOTE — Assessment & Plan Note (Signed)
History of hyperlipidemia intolerant to statin therapy 

## 2015-08-28 NOTE — Assessment & Plan Note (Signed)
History of hypertension blood pressure measured at 110/66. She is on hydrochlorothiazide, losartan and Lopressor. Continue current meds at current dosing

## 2015-08-28 NOTE — Progress Notes (Signed)
08/28/2015 Julia Rose   [birth year]  [id number]  Primary Physician Kaleen Mask, MD Primary Cardiologist: Runell Gess MD [name]   HPI:  Julia Rose is a 70 year old moderately overweight married Caucasian female mother of 4 children, grandmother of a grandchildren whose husband Julia Rose is also a patient of mine. She was referred by Dr. Jeannetta Nap for evaluation of recently recognized atrial fibrillation. Cholesterol her in the office 07/09/15. Her cardiac risk factor profile is notable for treated hypertension, diabetes. She has hyperlipidemia intolerant to statin drugs. She's never had a heart stroke. She occasionally gets chest tightness. 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 She was recently found to be in A. Fib during a routine doctor's visit and was referred here for further evaluation. 8. Event monitor showed A. Fib with heart rates as high as 170. 2-D echo revealed normal LV systolic function with mild to moderate aortic stenosis and a Myoview stress test was low risk and nonischemic. Carotid Doppler showed no evidence of ICA stenosis. She was placed on oral anticoagulation. Today she is in sinus rhythm.   Current Outpatient Prescriptions  Medication Sig Dispense Refill  . edoxaban (SAVAYSA) 60 MG TABS tablet Take 30 mg by mouth daily. 30 tablet 6  . hydrochlorothiazide (HYDRODIURIL) 25 MG tablet Take 25 mg by mouth daily.    Marland Kitchen ibuprofen (ADVIL,MOTRIN) 200 MG tablet Take 200 mg by mouth every 6 (six) hours as needed for moderate pain.    Marland Kitchen losartan (COZAAR) 100 MG tablet Take 50 mg by mouth at bedtime.  0  . metFORMIN (GLUMETZA) 1000 MG (MOD) 24 hr tablet Take 1,000 mg by mouth daily with breakfast.    . metoprolol tartrate (LOPRESSOR) 25 MG tablet Take 0.5 tablets (12.5 mg total) by mouth daily. 30 tablet 1  . PHENobarbital (LUMINAL) 97.2 MG tablet Take 1 tablet by mouth at bedtime.  4  . PARoxetine (PAXIL) 40 MG tablet Take 40  mg by mouth daily.  0   No current facility-administered medications for this visit.    Allergies  Allergen Reactions  . Dilantin [Phenytoin] Hives    Whelps all over for two weeks  . Statins Other (See Comments)    dizzy    Social History   Social History  . Marital Status: Married    Spouse Name: N/A  . Number of Children: N/A  . Years of Education: N/A   Occupational History  . Not on file.   Social History Main Topics  . Smoking status: Never Smoker   . Smokeless tobacco: Never Used  . Alcohol Use: No  . Drug Use: No  . Sexual Activity: Not on file   Other Topics Concern  . Not on file   Social History Narrative     Review of Systems: General: negative for chills, fever, night sweats or weight changes.  Cardiovascular: negative for chest pain, dyspnea on exertion, edema, orthopnea, palpitations, paroxysmal nocturnal dyspnea or shortness of breath Dermatological: negative for rash Respiratory: negative for cough or wheezing Urologic: negative for hematuria Abdominal: negative for nausea, vomiting, diarrhea, bright red blood per rectum, melena, or hematemesis Neurologic: negative for visual changes, syncope, or dizziness All other systems reviewed and are otherwise negative except as noted above.    Blood pressure 110/66, pulse 66, height  (1.575 m), weight 192 lb 4.8 oz (87.227 kg).  General appearance: alert and no distress Neck: no adenopathy, no JVD, supple, symmetrical, trachea midline, thyroid not enlarged, symmetric,  no tenderness/mass/nodules and soft bilateral carotid bruits versus transmitted murmur Lungs: clear to auscultation bilaterally Heart: 2/6 systolic ejection murmur consistent with aortic stenosis Extremities: extremities normal, atraumatic, no cyanosis or edema  EKG normal sinus rhythm at 63 with total branch block. I personally reviewed this EKG  ASSESSMENT AND PLAN:   Essential hypertension History of hypertension blood  pressure measured at 110/66. She is on hydrochlorothiazide, losartan and Lopressor. Continue current meds at current dosing  Hyperlipidemia History of hyperlipidemia intolerant to statin therapy  Chest pain History of chest pain with recent Myoview that was low risk and nonischemic  Aortic stenosis, moderate Recent 2-D echo performed because of A. Fib and a murmur revealed normal LV systolic function with moderate aortic stenosis. The peak gradient was 34 mmHg with a mean gradient of 21.  Atrial fibrillation (HCC) Paroxysmal atrial fibrillation with recent event monitor showing ventricular response as high as 170. She was started on oral anticoagulation. She has right bundle branch block. LV function was normal. She is on low-dose beta blocker. Today she is in sinus rhythm at 63. Continue current medications      Runell Gess MD [facility], [facility] 08/28/2015 11:18 AM

## 2015-08-28 NOTE — Patient Instructions (Addendum)
Medication Instructions:  Your physician has recommended you make the following change in your medication:  1) START Coumadin 5 mg by mouth ONCE daily   Labwork: none  Testing/Procedures: none  Follow-Up: Your physician recommends that you schedule a follow-up appointment: with Dr. Jeannetta Nap - PCP to follow your Coumadin   Your physician wants you to follow-up in: 6 months with Dr. Allyson Sabal. You will receive a reminder letter in the mail two months in advance. If you don't receive a letter, please call our office to schedule the follow-up appointment.   Any Other Special Instructions Will Be Listed Below (If Applicable).     If you need a refill on your cardiac medications before your next appointment, please call your pharmacy.

## 2015-08-28 NOTE — Assessment & Plan Note (Signed)
Recent 2-D echo performed because of A. Fib and a murmur revealed normal LV systolic function with moderate aortic stenosis. The peak gradient was 34 mmHg with a mean gradient of 21.

## 2015-09-21 ENCOUNTER — Other Ambulatory Visit: Payer: Self-pay | Admitting: Cardiovascular Disease

## 2015-10-07 ENCOUNTER — Encounter (HOSPITAL_COMMUNITY): Payer: Self-pay | Admitting: Nurse Practitioner

## 2015-10-07 ENCOUNTER — Other Ambulatory Visit (HOSPITAL_COMMUNITY): Payer: Self-pay | Admitting: Nurse Practitioner

## 2015-10-07 ENCOUNTER — Ambulatory Visit (HOSPITAL_COMMUNITY)
Admission: RE | Admit: 2015-10-07 | Discharge: 2015-10-07 | Disposition: A | Discharge status: Home / Self Care | Payer: Medicare Other | Source: Ambulatory Visit | Attending: Nurse Practitioner | Admitting: Nurse Practitioner

## 2015-10-07 VITALS — BP 120/68 | HR 93 | Ht 62.0 in | Wt 190.2 lb

## 2015-10-07 DIAGNOSIS — Z79899 Other long term (current) drug therapy: Secondary | ICD-10-CM | POA: Diagnosis not present

## 2015-10-07 DIAGNOSIS — I350 Nonrheumatic aortic (valve) stenosis: Secondary | ICD-10-CM | POA: Diagnosis not present

## 2015-10-07 DIAGNOSIS — I480 Paroxysmal atrial fibrillation: Secondary | ICD-10-CM | POA: Insufficient documentation

## 2015-10-07 DIAGNOSIS — I10 Essential (primary) hypertension: Secondary | ICD-10-CM | POA: Insufficient documentation

## 2015-10-07 DIAGNOSIS — Z7984 Long term (current) use of oral hypoglycemic drugs: Secondary | ICD-10-CM | POA: Diagnosis not present

## 2015-10-07 DIAGNOSIS — Z841 Family history of disorders of kidney and ureter: Secondary | ICD-10-CM | POA: Diagnosis not present

## 2015-10-07 DIAGNOSIS — E785 Hyperlipidemia, unspecified: Secondary | ICD-10-CM | POA: Diagnosis not present

## 2015-10-07 DIAGNOSIS — Z888 Allergy status to other drugs, medicaments and biological substances status: Secondary | ICD-10-CM | POA: Diagnosis not present

## 2015-10-07 DIAGNOSIS — Z8249 Family history of ischemic heart disease and other diseases of the circulatory system: Secondary | ICD-10-CM | POA: Diagnosis not present

## 2015-10-07 DIAGNOSIS — Z7901 Long term (current) use of anticoagulants: Secondary | ICD-10-CM | POA: Diagnosis not present

## 2015-10-07 DIAGNOSIS — E119 Type 2 diabetes mellitus without complications: Secondary | ICD-10-CM | POA: Diagnosis not present

## 2015-10-07 DIAGNOSIS — Z833 Family history of diabetes mellitus: Secondary | ICD-10-CM | POA: Diagnosis not present

## 2015-10-07 DIAGNOSIS — I4891 Unspecified atrial fibrillation: Secondary | ICD-10-CM | POA: Diagnosis present

## 2015-10-07 MED ORDER — METOPROLOL SUCCINATE ER 25 MG PO TB24: 25.0000 mg | ORAL_TABLET | Freq: Every day | ORAL | Status: DC

## 2015-10-07 NOTE — Progress Notes (Signed)
Patient ID: Julia BouchardBarbara K Rose, female   DOB: January 19, 1946, 70 y.o.   MRN: [id number]     Primary Care Physician: Julia MaskELKINS,WILSON OLIVER, MD Referring Physician: Dr. Matt Rose   Julia Rose is a 70 y.o. female with a recent h/o of PAF. She is referred by Dr. Allyson Rose for further evaluation. She is pending an echo and a stress test. The palpitations come and go usually daily but do not last that long. She had been started on edoxaban 30 mg daily for a chadsvasc score of 4. She is aware of fatigue, shortness of breath and chest heaviness when in afib. 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   On f/u in afib clinic, 1/13, she reports one sustained afib episode for several hours, a few smaller ones.  Overall, not bothering her enough to consider an antiarrythmic, but may do better if bb daily. She is very conservative toward medicine.contiuing endoxaban using this due to being most compatible  with pentaobarbital, but for her kidney function should be on 60 mg daily.  Returns 3/13, in afib today but unaware.Rate controlled. Feels much better when she started taking pill in pocket metoprolol tartrate on a daily basis. Switched over to warfarin due to cost. [place] next [place] and looking forward to the trip.   Today, she denies symptoms of palpitations, chest pain, shortness of breath, orthopnea, PND, lower extremity edema, dizziness, presyncope, syncope, or neurologic sequela. The patient is tolerating medications without difficulties and is otherwise without complaint today.   Past Medical History  Diagnosis Date  . Atrial fibrillation (HCC)   . Right bundle branch block   . Hypertension   . Hyperlipidemia     statin intolerant  . Diabetes (HCC)   . Moderate aortic stenosis    No past surgical history on file.  Current Outpatient Prescriptions  Medication Sig Dispense Refill  . FLUoxetine (PROZAC) 40 MG capsule Take 40 mg by mouth daily.    . hydrochlorothiazide  (HYDRODIURIL) 25 MG tablet Take 25 mg by mouth daily.    Marland Kitchen. ibuprofen (ADVIL,MOTRIN) 200 MG tablet Take 200 mg by mouth every 6 (six) hours as needed for moderate pain.    Marland Kitchen. losartan (COZAAR) 100 MG tablet Take 50 mg by mouth at bedtime.  0  . metFORMIN (GLUMETZA) 1000 MG (MOD) 24 hr tablet Take 1,000 mg by mouth daily with breakfast.    . PHENobarbital (LUMINAL) 97.2 MG tablet Take 1 tablet by mouth at bedtime.  4  . warfarin (COUMADIN) 5 MG tablet Take 1 tablet (5 mg total) by mouth daily. 30 tablet 0  . metoprolol succinate (TOPROL XL) 25 MG 24 hr tablet Take 1 tablet (25 mg total) by mouth daily. 30 tablet 6   No current facility-administered medications for this encounter.    Allergies  Allergen Reactions  . Dilantin [Phenytoin] Hives    Whelps all over for two weeks  . Statins Other (See Comments)    dizzy    Social History   Social History  . Marital Status: Married    Spouse Name: N/A  . Number of Children: N/A  . Years of Education: N/A   Occupational History  . Not on file.   Social History Main Topics  . Smoking status: Never Smoker   . Smokeless tobacco: Never Used  . Alcohol Use: No  . Drug Use: No  . Sexual Activity: Not on file   Other Topics Concern  . Not on file  Social History Narrative    Family History  Problem Relation Age of Onset  . Kidney disease Mother   . Diabetes Mother   . Dementia Mother   . Hypertension Mother     ROS- All systems are reviewed and negative except as per the HPI above  Physical Exam: Filed Vitals:   10/07/15 1111  BP: 120/68  Pulse: 93  Height:  (1.575 m)  Weight: 190 lb 3.2 oz (86.274 kg)    GEN- The patient is well appearing, alert and oriented x 3 today.   Head- normocephalic, atraumatic Eyes-  Sclera clear, conjunctiva pink Ears- hearing intact Oropharynx- clear Neck- supple, no JVP, soft bruits, rt louder than left,? Possible radiation of systolic murmur Lymph- no cervical  lymphadenopathy Lungs- Clear to ausculation bilaterally, normal work of breathing Heart- Irregular rate and rhythm, 2/6 systolic murmur rubs or gallops, PMI not laterally displaced GI- soft, NT, ND, + BS Extremities- no clubbing, cyanosis, or edema MS- no significant deformity or atrophy Skin- no rash or lesion Psych- euthymic mood, full affect Neuro- strength and sensation are intact  EKG- afib at 93 bpm, RBBB, qrs int 160 ms, qtc 484 ms. Epic records reviewed Echo-Left ventricle: The cavity size was normal. There was mild focal  basal hypertrophy of the septum. Systolic function was normal.  The estimated ejection fraction was in the range of 55% to 60%.  Wall motion was normal; there were no regional wall motion  abnormalities. Left ventricular diastolic function parameters  were normal. - Aortic valve: Trileaflet; mildly thickened, moderately calcified  leaflets. Cusp separation was reduced. There was mild to moderate  stenosis. There was trivial regurgitation. Peak velocity (S): 291  cm/s. Mean gradient (S): 21 mm Hg. - Mitral valve: Calcified annulus. - Left atrium: The atrium was mildly dilated.  Assessment and Plan: 1.PAF Metoprolol tartrate 25 mg daily,  may not  be optimally  covered for the 24 hours. Will change to metoprolol succinate 25 mg daily.  2. HTN Stable  3. Chadsvasc score of at least 4 On warfarin now for cost issues associated with DOAC  4. Mild to moderate stenosis Per Dr. Allyson Sabal Echo to be repeated in 12 months  F/u in 6 months  [facility] Julia Rose [address] [place], [place] [address] [phone]

## 2015-10-07 NOTE — Patient Instructions (Signed)
Your physician has recommended you make the following change in your medication:  1)Metoprolol succinate 25mg  once a day  Follow up in 6 months

## 2015-10-08 ENCOUNTER — Other Ambulatory Visit: Payer: Self-pay | Admitting: Cardiovascular Disease

## 2016-05-02 IMAGING — NM NM MISC PROCEDURE
6 series · 36 of 36 positions shown · non-contrast
Comparison: none

[Series 1: wbr_r-proj_st wbr rest · 6.40mm/px · 6 of 64 frames shown]
[frame 6/64]
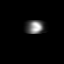
[frame 16/64]
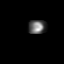
[frame 27/64]
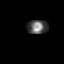
[frame 38/64]
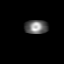
[frame 48/64]
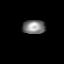
[frame 59/64]
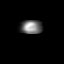

[Series 1: wbr rest · 6.40mm/px · 6 of 64 frames shown]
[frame 6/64]
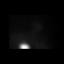
[frame 16/64]
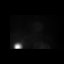
[frame 27/64]
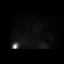
[frame 38/64]
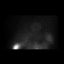
[frame 48/64]
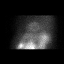
[frame 59/64]
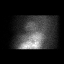

[Series 2: wbr_s-proj_st wbr stress-gsp · 6.40mm/px · 6 of 512 frames shown]
[frame 43/512]
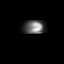
[frame 128/512]
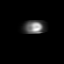
[frame 214/512]
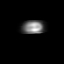
[frame 299/512]
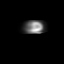
[frame 384/512]
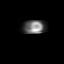
[frame 470/512]
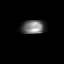

[Series 2: wbr stress-gsp · 6.40mm/px · 6 of 512 frames shown]
[frame 43/512]
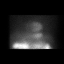
[frame 128/512]
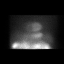
[frame 214/512]
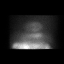
[frame 299/512]
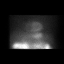
[frame 384/512]
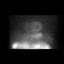
[frame 470/512]
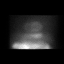

[Series 3: wbr_s-proj_st wbr stress-sum-em · 6.40mm/px · 6 of 64 frames shown]
[frame 6/64]
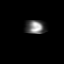
[frame 16/64]
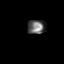
[frame 27/64]
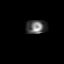
[frame 38/64]
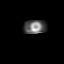
[frame 48/64]
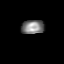
[frame 59/64]
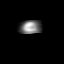

[Series 3: wbr stress-sum-em · 6.40mm/px · 6 of 64 frames shown]
[frame 6/64]
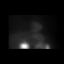
[frame 16/64]
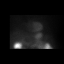
[frame 27/64]
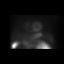
[frame 38/64]
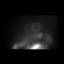
[frame 48/64]
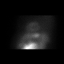
[frame 59/64]
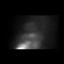

[36 of 36 positions shown; findings below may reference images not displayed]

Canned report from images found in remote index.

Refer to host system for actual result text.

## 2016-08-31 ENCOUNTER — Ambulatory Visit (HOSPITAL_COMMUNITY)
Admission: RE | Admit: 2016-08-31 | Discharge: 2016-08-31 | Disposition: A | Discharge status: Home / Self Care | Payer: Medicare Other | Source: Ambulatory Visit | Attending: Cardiovascular Disease | Admitting: Cardiovascular Disease

## 2016-08-31 DIAGNOSIS — I350 Nonrheumatic aortic (valve) stenosis: Secondary | ICD-10-CM | POA: Insufficient documentation

## 2016-08-31 DIAGNOSIS — R011 Cardiac murmur, unspecified: Secondary | ICD-10-CM | POA: Diagnosis not present

## 2016-08-31 DIAGNOSIS — I351 Nonrheumatic aortic (valve) insufficiency: Secondary | ICD-10-CM | POA: Diagnosis not present

## 2016-08-31 NOTE — Progress Notes (Signed)
  Echocardiogram 2D Echocardiogram has been performed.  Arvil ChacoFoster, Karstyn Birkey 08/31/2016, 2:57 PM

## 2016-09-01 ENCOUNTER — Other Ambulatory Visit: Payer: Self-pay | Admitting: Cardiovascular Disease

## 2016-09-01 DIAGNOSIS — I350 Nonrheumatic aortic (valve) stenosis: Secondary | ICD-10-CM

## 2016-09-02 ENCOUNTER — Ambulatory Visit (INDEPENDENT_AMBULATORY_CARE_PROVIDER_SITE_OTHER): Payer: Medicare Other | Admitting: Cardiovascular Disease

## 2016-09-02 ENCOUNTER — Encounter: Payer: Self-pay | Admitting: Cardiovascular Disease

## 2016-09-02 VITALS — BP 120/86 | HR 89 | Ht 62.0 in | Wt 185.6 lb

## 2016-09-02 DIAGNOSIS — I208 Other forms of angina pectoris: Secondary | ICD-10-CM

## 2016-09-02 DIAGNOSIS — I480 Paroxysmal atrial fibrillation: Secondary | ICD-10-CM

## 2016-09-02 DIAGNOSIS — E7800 Pure hypercholesterolemia, unspecified: Secondary | ICD-10-CM | POA: Diagnosis not present

## 2016-09-02 DIAGNOSIS — I350 Nonrheumatic aortic (valve) stenosis: Secondary | ICD-10-CM

## 2016-09-02 DIAGNOSIS — I10 Essential (primary) hypertension: Secondary | ICD-10-CM | POA: Diagnosis not present

## 2016-09-02 NOTE — Assessment & Plan Note (Signed)
History of persistent atrial fibrillation on Coumadin anticoagulation rate controlled. When I saw her a year ago she was in sinus rhythm. She says she knows when she is in A. fib and symptomatic when she is in A. fib. I'm referring her to the A. fib clinic for consideration of antiarrhythmic medications.

## 2016-09-02 NOTE — Progress Notes (Signed)
09/02/2016 Julia BouchardBarbara K Rose   10/08/1945  [id number]  Primary Physician Julia Rose,Julia OLIVER, MD Primary Cardiologist: Julia GessJonathan J Solina Heron MD Julia RenoFACP, FACC, FAHA, FSCAI  HPI:  Julia Rose is a 71 year old moderately overweight married Caucasian female mother of 4 children, grandmother of a grandchildren whose husband Julia Rose is also a patient of mine. She was referred by Dr. Jeannetta Rose for evaluation of recently recognized atrial fibrillation. I last saw her in the office 08/28/15. Her cardiac risk factor profile is notable for treated hypertension, diabetes. She has hyperlipidemia intolerant to statin drugs. She's never had a heart stroke. She occasionally gets chest tightness. 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 She was recently found to be in A. Fib during a routine doctor's visit and was referred here for further evaluation. 8. Event monitor showed A. Fib with heart rates as high as 170. 2-D echo revealed normal LV systolic function with mild to moderate aortic stenosis and a Myoview stress test was low risk and nonischemic. Carotid Doppler showed no evidence of ICA stenosis. She was placed on oral anticoagulation. Today she is in atrial fibrillation with a controlled ventricular response on Coumadin anticoagulation. She does say that she can feel when she is in A. fib with somewhat symptomatic during this. She is also complaining of mild increasing frequency of exertional substernal chest pain. Her 2-D echo shows moderate aortic stenosis which has not changed.   Current Outpatient Prescriptions  Medication Sig Dispense Refill  . FLUoxetine (PROZAC) 40 MG capsule Take 40 mg by mouth daily.    . hydrochlorothiazide (HYDRODIURIL) 25 MG tablet Take 25 mg by mouth daily.    Marland Kitchen. ibuprofen (ADVIL,MOTRIN) 200 MG tablet Take 200 mg by mouth every 6 (six) hours as needed for moderate pain.    Marland Kitchen. losartan (COZAAR) 100 MG tablet Take 50 mg by mouth at bedtime.  0  . metFORMIN (GLUMETZA)  1000 MG (MOD) 24 hr tablet Take 1,000 mg by mouth daily with breakfast.    . metoprolol succinate (TOPROL XL) 25 MG 24 hr tablet Take 1 tablet (25 mg total) by mouth daily. 30 tablet 6  . PHENobarbital (LUMINAL) 97.2 MG tablet Take 1 tablet by mouth at bedtime.  4  . warfarin (COUMADIN) 5 MG tablet Take 1 tablet (5 mg total) by mouth daily. 30 tablet 0   No current facility-administered medications for this visit.     Allergies  Allergen Reactions  . Dilantin [Phenytoin] Hives    Whelps all over for two weeks  . Statins Other (See Comments)    dizzy    Social History   Social History  . Marital status: Married    Spouse name: N/A  . Number of children: N/A  . Years of education: N/A   Occupational History  . Not on file.   Social History Main Topics  . Smoking status: Never Smoker  . Smokeless tobacco: Never Used  . Alcohol use No  . Drug use: No  . Sexual activity: Not on file   Other Topics Concern  . Not on file   Social History Narrative  . No narrative on file     Review of Systems: General: negative for chills, fever, night sweats or weight changes.  Cardiovascular: negative for chest pain, dyspnea on exertion, edema, orthopnea, palpitations, paroxysmal nocturnal dyspnea or shortness of breath Dermatological: negative for rash Respiratory: negative for cough or wheezing Urologic: negative for hematuria Abdominal: negative for nausea, vomiting, diarrhea, bright red blood per  rectum, melena, or hematemesis Neurologic: negative for visual changes, syncope, or dizziness All other systems reviewed and are otherwise negative except as noted above.    Blood pressure 120/86, pulse 89, height 5\' 2"  (1.575 m), weight 185 lb 9.6 oz (84.2 kg).  General appearance: alert and no distress Neck: no adenopathy, no carotid bruit, no JVD, supple, symmetrical, trachea midline and thyroid not enlarged, symmetric, no tenderness/mass/nodules Lungs: clear to auscultation  bilaterally Heart: Irregularly irregular rhythm with a soft outflow tract murmur Extremities: extremities normal, atraumatic, no cyanosis or edema  EKG atrial fibrillation with a ventricular response of 89 and regular branch block. I personally reviewed this EKG  ASSESSMENT AND PLAN:   Atrial fibrillation (HCC) History of persistent atrial fibrillation on Coumadin anticoagulation rate controlled. When I saw her a year ago she was in sinus rhythm. She says she knows when she is in A. fib and symptomatic when she is in A. fib. I'm referring her to the A. fib clinic for consideration of antiarrhythmic medications.  Essential hypertension History of hypertension blood pressure measured today at 120/86. She is on losartan, hydrochlorothiazide and metoprolol. Continue current meds at current dosing.  Hyperlipidemia History of hyperlipidemia not on statin therapy followed by her PCP  Chest pain History of exertional chest pressure with a negative Myoview performed 07/31/15. This has mildly increased in frequency and severity. We will continue to follow this clinically.  Aortic stenosis, moderate History of moderate aortic stenosis or 2-D echo performed 08/31/16 with an aortic valve area of 0.72 cm and a peak gradient of 36 moment asymmetry. We will follow this on an annual basis.      Julia Gess MD FACP,FACC,FAHA, [facility] 09/02/2016 9:30 AM

## 2016-09-02 NOTE — Assessment & Plan Note (Signed)
History of exertional chest pressure with a negative Myoview performed 07/31/15. This has mildly increased in frequency and severity. We will continue to follow this clinically.

## 2016-09-02 NOTE — Patient Instructions (Addendum)
Medication Instructions: Your physician recommends that you continue on your current medications as directed. Please refer to the Current Medication list given to you today.  Labwork: I will request labs from Dr. Jeannetta NapElkins.  Testing/Procedures: Your physician has requested that you have an echocardiogram. Echocardiography is a painless test that uses sound waves to create images of your heart. It provides your doctor with information about the size and shape of your heart and how well your heart's chambers and valves are working. This procedure takes approximately one hour. There are no restrictions for this procedure.   Follow-Up: Your physician recommends that you schedule a follow-up appointment after echo is completed.  You have been referred to AFIB Clinic for anti-arrhythmic therapy.   If you need a refill on your cardiac medications before your next appointment, please call your pharmacy.

## 2016-09-02 NOTE — Assessment & Plan Note (Signed)
History of hyperlipidemia not on statin therapy followed by her PCP 

## 2016-09-02 NOTE — Assessment & Plan Note (Signed)
History of moderate aortic stenosis or 2-D echo performed 08/31/16 with an aortic valve area of 0.72 cm and a peak gradient of 36 moment asymmetry. We will follow this on an annual basis.

## 2016-09-02 NOTE — Assessment & Plan Note (Addendum)
History of hypertension blood pressure measured today at 120/86. She is on losartan, hydrochlorothiazide and metoprolol. Continue current meds at current dosing.

## 2016-09-08 ENCOUNTER — Ambulatory Visit (HOSPITAL_COMMUNITY)
Admission: RE | Admit: 2016-09-08 | Discharge: 2016-09-08 | Disposition: A | Discharge status: Home / Self Care | Payer: Medicare Other | Source: Ambulatory Visit | Attending: Nurse Practitioner | Admitting: Nurse Practitioner

## 2016-09-08 ENCOUNTER — Encounter (HOSPITAL_COMMUNITY): Payer: Self-pay | Admitting: Nurse Practitioner

## 2016-09-08 VITALS — BP 146/84 | HR 94 | Ht 62.0 in | Wt 186.0 lb

## 2016-09-08 DIAGNOSIS — Z7901 Long term (current) use of anticoagulants: Secondary | ICD-10-CM | POA: Diagnosis not present

## 2016-09-08 DIAGNOSIS — I10 Essential (primary) hypertension: Secondary | ICD-10-CM | POA: Insufficient documentation

## 2016-09-08 DIAGNOSIS — I4819 Other persistent atrial fibrillation: Secondary | ICD-10-CM

## 2016-09-08 DIAGNOSIS — I350 Nonrheumatic aortic (valve) stenosis: Secondary | ICD-10-CM | POA: Insufficient documentation

## 2016-09-08 DIAGNOSIS — Z79899 Other long term (current) drug therapy: Secondary | ICD-10-CM | POA: Insufficient documentation

## 2016-09-08 DIAGNOSIS — I481 Persistent atrial fibrillation: Secondary | ICD-10-CM | POA: Diagnosis not present

## 2016-09-08 DIAGNOSIS — Z5189 Encounter for other specified aftercare: Secondary | ICD-10-CM | POA: Diagnosis not present

## 2016-09-08 DIAGNOSIS — Z7984 Long term (current) use of oral hypoglycemic drugs: Secondary | ICD-10-CM | POA: Insufficient documentation

## 2016-09-08 NOTE — Progress Notes (Signed)
Patient ID: Julia Rose, female   DOB: [birth year], 71 y.o.   MRN: [id number]     Primary Care Physician: Kaleen Mask, MD Referring Physician: Dr. Matt Holmes Julia Rose is a 71 y.o. female with a recent h/o of PAF. She is referred by Dr. Allyson Sabal for further evaluation 12/16.  The palpitations would come and go usually daily but do not last that long. She had been started on edoxaban 30 mg daily for a chadsvasc score of 4. She is aware of fatigue, shortness of breath and chest heaviness when in afib. 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   On f/u in afib clinic, 08/09/15, she reported one sustained afib episode for several hours, a few smaller ones.  Overall, not bothering her enough to consider an antiarrythmic. Daily BB was added. She is very conservative toward medicine. Continued endoxaban using this due to being most compatible  with pentaobarbital, but eventually converted to coumadin for cost issues.  Returned 10/07/15, in afib today but unaware.Rate controlled. 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  Returns to afib clinic after seeing Dr. Allyson Sabal and pt feeling she has been in persistent afib since  fall. For the most part she is minimally symptomatic. She walks with her daughter when the weather is nice 30 mins to an hour and can do so with minimal symptoms. She had a repeat ECHO with normal EF and moderate aortic stenosis which is stable. She was asked to be seen here to discuss antiarrythmic therapy. 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 Continues warfarin for a CHA2DS2VASc score of at least 3.  Today, she  with minimal  symptoms of palpitations, shortness of breath, no chest pain, orthopnea, PND, lower extremity edema, dizziness, presyncope, syncope, or neurologic sequela. The patient is tolerating medications without difficulties and is otherwise without complaint today.   Past Medical History:  Diagnosis Date  .  Atrial fibrillation (HCC)   . Diabetes (HCC)   . Hyperlipidemia    statin intolerant  . Hypertension   . Moderate aortic stenosis   . Right bundle branch block    No past surgical history on file.  Current Outpatient Prescriptions  Medication Sig Dispense Refill  . FLUoxetine (PROZAC) 40 MG capsule Take 40 mg by mouth daily.    . hydrochlorothiazide (HYDRODIURIL) 25 MG tablet Take 25 mg by mouth daily.    Marland Kitchen ibuprofen (ADVIL,MOTRIN) 200 MG tablet Take 200 mg by mouth every 6 (six) hours as needed for moderate pain.    Marland Kitchen losartan (COZAAR) 100 MG tablet Take 50 mg by mouth at bedtime.  0  . lovastatin (MEVACOR) 20 MG tablet 1 tablet daily.    . metFORMIN (GLUMETZA) 1000 MG (MOD) 24 hr tablet Take 1,000 mg by mouth daily with breakfast.    . metoprolol succinate (TOPROL XL) 25 MG 24 hr tablet Take 1 tablet (25 mg total) by mouth daily. 30 tablet 6  . PHENobarbital (LUMINAL) 97.2 MG tablet Take 1 tablet by mouth at bedtime.  4  . warfarin (COUMADIN) 5 MG tablet Take 1 tablet (5 mg total) by mouth daily. (Patient taking differently: Take 5 mg by mouth daily. 1.5 on Sunday and 2 tablets the rest of the week) 30 tablet 0   No current facility-administered medications for this encounter.     Allergies  Allergen Reactions  . Dilantin [Phenytoin] Hives    Whelps all over for two weeks  .  Statins Other (See Comments)    dizzy    Social History   Social History  . Marital status: Married    Spouse name: N/A  . Number of children: N/A  . Years of education: N/A   Occupational History  . Not on file.   Social History Main Topics  . Smoking status: Never Smoker  . Smokeless tobacco: Never Used  . Alcohol use No  . Drug use: No  . Sexual activity: Not on file   Other Topics Concern  . Not on file   Social History Narrative  . No narrative on file    Family History  Problem Relation Age of Onset  . Kidney disease Mother   . Diabetes Mother   . Dementia Mother   .  Hypertension Mother     ROS- All systems are reviewed and negative except as per the HPI above  Physical Exam: Vitals:   09/08/16 0921  BP: (!) 146/84  Pulse: 94  Weight: 186 lb (84.4 kg)  Height: 5\' 2"  (1.575 m)    GEN- The patient is well appearing, alert and oriented x 3 today.   Head- normocephalic, atraumatic Eyes-  Sclera clear, conjunctiva pink Ears- hearing intact Oropharynx- clear Neck- supple, no JVP, soft bruits, rt louder than left,? Possible radiation of systolic murmur Lymph- no cervical lymphadenopathy Lungs- Clear to ausculation bilaterally, normal work of breathing Heart- Irregular rate and rhythm, 2/6 systolic murmur rubs or gallops, PMI not laterally displaced GI- soft, NT, ND, + BS Extremities- no clubbing, cyanosis, or edema MS- no significant deformity or atrophy Skin- no rash or lesion Psych- euthymic mood, full affect Neuro- strength and sensation are intact  EKG- afib at 94 bpm, RBBB, qrs int 142 ms, qtc 484 ms. Epic records reviewed Echo-Study Conclusions 08/31/16-  Study Conclusions  - Left ventricle: The cavity size was normal. Wall thickness was   normal. Systolic function was vigorous. The estimated ejection   fraction was in the range of 65% to 70%. Wall motion was normal;   there were no regional wall motion abnormalities. The study is   not technically sufficient to allow evaluation of LV diastolic   function. - Aortic valve: Trileaflet; moderately thickened, severely   calcified leaflets. Valve mobility was restricted. There was   moderate stenosis. There was trivial regurgitation. Peak velocity   (S): 299 cm/s. Mean gradient (S): 17 mm Hg. - Mitral valve: There was mild regurgitation. - Left atrium: The atrium was mildly dilated. Volume/bsa, S: 37.2   ml/m^2. - Right atrium: The atrium was mildly dilated. - Tricuspid valve: There was moderate regurgitation. - Pulmonary arteries: Systolic pressure was mildly increased. PA   peak  pressure: 35 mm Hg (S).  Impressions:  - Compared to the prior study, there has been no significant   interval change.   Assessment and Plan: 1 Persistent afib x 3 months, minimally symptomatic, rate controlled Discussed antiarrythmic therapy with pt, flecainide not an option due to RBBB at baseline, discussed amiodarone, sotalol or Tikosyn, QTC length may be a concern Not long enough period of time to start anything since pt will be leaving the country x 10 days staring 2/26 She is not sure that she is symptomatic enough to warrant starting antiarrythmic  Continue metoprolol succinate 25 mg daily   2. HTN Stable  3. Chadsvasc score of at least 4 On warfarin now for cost issues associated with DOAC Will need weekly therapeutic INR's x 4, if considering chemical or electrical  cardioversion  4.Moderate aortic stenosis Stable per echo Per Dr. Allyson SabalBerry, scheduled to be repeated in one year   Will see back in 6 weeks to further discuss AAD therapy   Julia Rose, ANP-C [facility]  [address] [place], [place] [address] [phone]

## 2016-10-28 ENCOUNTER — Encounter (HOSPITAL_COMMUNITY): Payer: Self-pay | Admitting: Nurse Practitioner

## 2016-10-28 ENCOUNTER — Ambulatory Visit (HOSPITAL_COMMUNITY)
Admission: RE | Admit: 2016-10-28 | Discharge: 2016-10-28 | Disposition: A | Discharge status: Home / Self Care | Payer: Medicare Other | Source: Ambulatory Visit | Attending: Nurse Practitioner | Admitting: Nurse Practitioner

## 2016-10-28 VITALS — BP 146/82 | HR 88 | Ht 62.0 in | Wt 194.2 lb

## 2016-10-28 DIAGNOSIS — R0602 Shortness of breath: Secondary | ICD-10-CM | POA: Diagnosis not present

## 2016-10-28 DIAGNOSIS — Z833 Family history of diabetes mellitus: Secondary | ICD-10-CM | POA: Diagnosis not present

## 2016-10-28 DIAGNOSIS — R5383 Other fatigue: Secondary | ICD-10-CM | POA: Insufficient documentation

## 2016-10-28 DIAGNOSIS — Z79899 Other long term (current) drug therapy: Secondary | ICD-10-CM | POA: Diagnosis not present

## 2016-10-28 DIAGNOSIS — I10 Essential (primary) hypertension: Secondary | ICD-10-CM | POA: Diagnosis not present

## 2016-10-28 DIAGNOSIS — Z7982 Long term (current) use of aspirin: Secondary | ICD-10-CM | POA: Insufficient documentation

## 2016-10-28 DIAGNOSIS — Z7901 Long term (current) use of anticoagulants: Secondary | ICD-10-CM | POA: Insufficient documentation

## 2016-10-28 DIAGNOSIS — I350 Nonrheumatic aortic (valve) stenosis: Secondary | ICD-10-CM | POA: Diagnosis not present

## 2016-10-28 DIAGNOSIS — I481 Persistent atrial fibrillation: Secondary | ICD-10-CM

## 2016-10-28 DIAGNOSIS — E119 Type 2 diabetes mellitus without complications: Secondary | ICD-10-CM | POA: Diagnosis not present

## 2016-10-28 DIAGNOSIS — I4819 Other persistent atrial fibrillation: Secondary | ICD-10-CM

## 2016-10-28 DIAGNOSIS — Z8249 Family history of ischemic heart disease and other diseases of the circulatory system: Secondary | ICD-10-CM | POA: Insufficient documentation

## 2016-10-28 DIAGNOSIS — Z7984 Long term (current) use of oral hypoglycemic drugs: Secondary | ICD-10-CM | POA: Diagnosis not present

## 2016-10-28 NOTE — Progress Notes (Addendum)
Patient ID: Julia Rose, female   DOB: 08-03-1945, 71 y.o.   MRN: [id number]     Primary Care Physician: Julia Mask, MD Referring Physician: Dr. Matt Holmes [name] is a 71 y.o. female with a recent h/o of PAF. She is referred by Dr. Allyson Sabal for further evaluation 12/16.  The palpitations would come and go usually daily but do not last that long. She had been started on edoxaban 30 mg daily for a chadsvasc score of 4. She is aware of fatigue, shortness of breath and chest heaviness when in afib. 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   On f/u in afib clinic, 08/09/15, she reported one sustained afib episode for several hours, a few smaller ones.  Overall, not bothering her enough to consider an antiarrythmic. Daily BB was added. She is very conservative toward medicine. Continued endoxaban using this due to being most compatible  with pentaobarbital, but eventually converted to coumadin for cost issues.  Returned 10/07/15, in afib today but unaware.Rate controlled. 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  Returns to afib clinic 09/2016,  after seeing Dr. Allyson Sabal and pt feeling she has been in persistent afib since  fall. For the most part she is minimally symptomatic. She walks with her daughter when the weather is nice 30 mins to an hour and can do so with minimal symptoms. She had a repeat ECHO with normal EF and moderate aortic stenosis which she was told was   stable. She was asked to be seen here to discuss antiarrythmic therapy. 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 Continues warfarin for a CHA2DS2VASc score of at least 3. She deferred any change in therapy due to up coming trip.   Returns to afib clinic 4/4, and wishes to further discuss restoring SR. When she went on her trip, she noticed how short of breath with activity she had become since the trip last year. People in her group were always waiting for her to catch up with  them.She reports that when she makes the bed, she has to rest half way in between. Financial concerns would definitely play into what antiarrythmic she would be able to afford.  Today, she  with minimal  symptoms of palpitations, shortness of breath, no chest pain, orthopnea, PND, lower extremity edema, dizziness, presyncope, syncope, or neurologic sequela. The patient is tolerating medications without difficulties and is otherwise without complaint today.   Past Medical History:  Diagnosis Date  . Atrial fibrillation (HCC)   . Diabetes (HCC)   . Hyperlipidemia    statin intolerant  . Hypertension   . Moderate aortic stenosis   . Right bundle branch block    No past surgical history on file.  Current Outpatient Prescriptions  Medication Sig Dispense Refill  . Aspirin-Acetaminophen-Caffeine (EXCEDRIN MIGRAINE PO) Take by mouth.    Marland Kitchen FLUoxetine (PROZAC) 40 MG capsule Take 40 mg by mouth daily.    . hydrochlorothiazide (HYDRODIURIL) 25 MG tablet Take 25 mg by mouth daily.    Marland Kitchen ibuprofen (ADVIL,MOTRIN) 200 MG tablet Take 200 mg by mouth every 6 (six) hours as needed for moderate pain.    . Loperamide HCl (ANTI-DIARRHEAL PO) Take by mouth as needed.    Marland Kitchen losartan (COZAAR) 100 MG tablet Take 50 mg by mouth at bedtime.  0  . lovastatin (MEVACOR) 20 MG tablet 1 tablet daily.    . meclizine (ANTIVERT) 25 MG tablet Take  25 mg by mouth 3 (three) times daily as needed for dizziness. Used rarely    . metFORMIN (GLUMETZA) 1000 MG (MOD) 24 hr tablet Take 1,000 mg by mouth daily with breakfast.    . metoprolol succinate (TOPROL XL) 25 MG 24 hr tablet Take 1 tablet (25 mg total) by mouth daily. 30 tablet 6  . PHENobarbital (LUMINAL) 97.2 MG tablet Take 1 tablet by mouth at bedtime.  4  . Simethicone (GAS RELIEF) 180 MG CAPS Take by mouth as needed.    . warfarin (COUMADIN) 5 MG tablet Take 1 tablet (5 mg total) by mouth daily. (Patient taking differently: Take 5 mg by mouth daily. 1.5 on Sunday and 2  tablets the rest of the week) 30 tablet 0   No current facility-administered medications for this encounter.     Allergies  Allergen Reactions  . Dilantin [Phenytoin] Hives    Whelps all over for two weeks  . Statins Other (See Comments)    dizzy    Social History   Social History  . Marital status: Married    Spouse name: N/A  . Number of children: N/A  . Years of education: N/A   Occupational History  . Not on file.   Social History Main Topics  . Smoking status: Never Smoker  . Smokeless tobacco: Never Used  . Alcohol use No  . Drug use: No  . Sexual activity: Not on file   Other Topics Concern  . Not on file   Social History Narrative  . No narrative on file    Family History  Problem Relation Age of Onset  . Kidney disease Mother   . Diabetes Mother   . Dementia Mother   . Hypertension Mother     ROS- All systems are reviewed and negative except as per the HPI above  Physical Exam: Vitals:   10/28/16 1040  BP: (!) 146/82  Pulse: 88  Weight: 194 lb 3.2 oz (88.1 kg)  Height:  (1.575 m)    GEN- The patient is well appearing, alert and oriented x 3 today.   Head- normocephalic, atraumatic Eyes-  Sclera clear, conjunctiva pink Ears- hearing intact Oropharynx- clear Neck- supple, no JVP, soft bruits, rt louder than left,? Possible radiation of systolic murmur Lymph- no cervical lymphadenopathy Lungs- Clear to ausculation bilaterally, normal work of breathing Heart- Irregular rate and rhythm, 2/6 systolic murmur rubs or gallops, PMI not laterally displaced GI- soft, NT, ND, + BS Extremities- no clubbing, cyanosis, or edema MS- no significant deformity or atrophy Skin- no rash or lesion Psych- euthymic mood, full affect Neuro- strength and sensation are intact  EKG- afib at 88 bpm, RBBB, qrs int 128 ms, qtc 484 ms. Epic records reviewed Echo-Study Conclusions 08/31/16-  Study Conclusions  - Left ventricle: The cavity size was normal.  Wall thickness was   normal. Systolic function was vigorous. The estimated ejection   fraction was in the range of 65% to 70%. Wall motion was normal;   there were no regional wall motion abnormalities. The study is   not technically sufficient to allow evaluation of LV diastolic   function. - Aortic valve: Trileaflet; moderately thickened, severely   calcified leaflets. Valve mobility was restricted. There was   moderate stenosis. There was trivial regurgitation. Peak velocity   (S): 299 cm/s. Mean gradient (S): 17 mm Hg. - Mitral valve: There was mild regurgitation. - Left atrium: The atrium was mildly dilated. Volume/bsa, S: 37.2   ml/m^2. -  Right atrium: The atrium was mildly dilated. - Tricuspid valve: There was moderate regurgitation. - Pulmonary arteries: Systolic pressure was mildly increased. PA   peak pressure: 35 mm Hg (S).  Impressions:  - Compared to the prior study, there has been no significant   interval change.   Assessment and Plan: 1 Persistent afib x  4-6 months, rate controlled Has become more short of breath with exertion and fatigued Discussed antiarrythmic therapy with pt, flecainide not an option due to RBBB at baseline, discussed  amiodarone, multaq/ Tikosyn, QTC length may be a concern. Pt will check prices I am also considering if she may need a w/u with a TEE to see if aortic stenosis, mod TR may be worse than appears on TTE and contributing to her shortness of breath as well. iF so, may be a valve replacement/ maze candidate. I will discuss with Dr. Johney Frame Continue metoprolol succinate 25 mg daily  PT will check on price of multaq/tikosyn  2. HTN Stable  3. Chadsvasc score of at least 4 On warfarin now for cost issues associated with DOAC Will need weekly therapeutic INR's x 4, if considering chemical or electrical cardioversion  4.Moderate aortic stenosis Per Dr. Allyson Sabal, scheduled to be repeated in one year   F/u in a few weeks to further  discuss above  Addendum: I spoke to pt after she had checked on price of tikosyn and multaq. She states that she cannot afford either drug. She would like to try just a cardioversion to see if she can be returned to SR with this alone and if shortness of breath significantly improves then we will know that all of her symptoms are being driven by Afib. If shortness of breath is not much better in SR then possibly her valve may need further evaluation. She is on warfarin so will need 4 weekly therapeutic INR's before scheduling.  Elvina Sidle Matthew Folks [facility] [address] [place], [place] [address] [phone]

## 2016-11-04 ENCOUNTER — Other Ambulatory Visit (HOSPITAL_COMMUNITY): Payer: Self-pay | Admitting: *Deleted

## 2016-11-04 DIAGNOSIS — I482 Chronic atrial fibrillation, unspecified: Secondary | ICD-10-CM

## 2016-11-04 NOTE — Addendum Note (Signed)
Encounter addended by: Newman Nip, NP on: 11/04/2016 10:07 AM<BR>    Actions taken: Sign clinical note

## 2016-11-04 NOTE — Telephone Encounter (Signed)
Entered in error

## 2016-11-05 ENCOUNTER — Ambulatory Visit (HOSPITAL_COMMUNITY): Admit: 2016-11-05 | Payer: Medicare Other | Admitting: Internal Medicine

## 2016-11-05 ENCOUNTER — Encounter (HOSPITAL_COMMUNITY): Payer: Self-pay

## 2016-11-05 SURGERY — CARDIOVERSION
Anesthesia: Monitor Anesthesia Care

## 2016-12-10 ENCOUNTER — Telehealth (HOSPITAL_COMMUNITY): Payer: Self-pay | Admitting: *Deleted

## 2016-12-10 ENCOUNTER — Other Ambulatory Visit (HOSPITAL_COMMUNITY): Payer: Self-pay | Admitting: *Deleted

## 2016-12-10 NOTE — Telephone Encounter (Signed)
INRs from PCP office   5/2 - 2.4 5/10 - 2.1 5.16 - 2.3 Pt will come to clinic 5/24 for INR morning of cardioversion.

## 2016-12-17 ENCOUNTER — Ambulatory Visit (HOSPITAL_COMMUNITY): Payer: Medicare Other | Admitting: Certified Registered Nurse Anesthetist

## 2016-12-17 ENCOUNTER — Encounter (HOSPITAL_COMMUNITY)
Admission: RE | Disposition: A | Discharge status: Home / Self Care | Payer: Self-pay | Source: Ambulatory Visit | Attending: Cardiovascular Disease

## 2016-12-17 ENCOUNTER — Encounter (HOSPITAL_COMMUNITY): Payer: Self-pay

## 2016-12-17 ENCOUNTER — Ambulatory Visit (HOSPITAL_BASED_OUTPATIENT_CLINIC_OR_DEPARTMENT_OTHER)
Admission: RE | Admit: 2016-12-17 | Discharge: 2016-12-17 | Disposition: A | Discharge status: Home / Self Care | Payer: Medicare Other | Source: Ambulatory Visit | Attending: Nurse Practitioner | Admitting: Nurse Practitioner

## 2016-12-17 ENCOUNTER — Ambulatory Visit (HOSPITAL_COMMUNITY)
Admission: RE | Admit: 2016-12-17 | Discharge: 2016-12-17 | Disposition: A | Discharge status: Home / Self Care | Payer: Medicare Other | Source: Ambulatory Visit | Attending: Cardiovascular Disease | Admitting: Cardiovascular Disease

## 2016-12-17 VITALS — BP 122/60 | HR 82 | Ht 62.0 in | Wt 197.2 lb

## 2016-12-17 DIAGNOSIS — E785 Hyperlipidemia, unspecified: Secondary | ICD-10-CM | POA: Diagnosis not present

## 2016-12-17 DIAGNOSIS — I350 Nonrheumatic aortic (valve) stenosis: Secondary | ICD-10-CM | POA: Insufficient documentation

## 2016-12-17 DIAGNOSIS — Z7901 Long term (current) use of anticoagulants: Secondary | ICD-10-CM | POA: Diagnosis not present

## 2016-12-17 DIAGNOSIS — I10 Essential (primary) hypertension: Secondary | ICD-10-CM | POA: Diagnosis not present

## 2016-12-17 DIAGNOSIS — I481 Persistent atrial fibrillation: Secondary | ICD-10-CM

## 2016-12-17 DIAGNOSIS — Z7982 Long term (current) use of aspirin: Secondary | ICD-10-CM | POA: Diagnosis not present

## 2016-12-17 DIAGNOSIS — Z6836 Body mass index (BMI) 36.0-36.9, adult: Secondary | ICD-10-CM | POA: Diagnosis not present

## 2016-12-17 DIAGNOSIS — E669 Obesity, unspecified: Secondary | ICD-10-CM | POA: Diagnosis not present

## 2016-12-17 DIAGNOSIS — I4510 Unspecified right bundle-branch block: Secondary | ICD-10-CM | POA: Diagnosis not present

## 2016-12-17 DIAGNOSIS — Z7984 Long term (current) use of oral hypoglycemic drugs: Secondary | ICD-10-CM | POA: Diagnosis not present

## 2016-12-17 DIAGNOSIS — I4819 Other persistent atrial fibrillation: Secondary | ICD-10-CM

## 2016-12-17 DIAGNOSIS — E119 Type 2 diabetes mellitus without complications: Secondary | ICD-10-CM | POA: Diagnosis not present

## 2016-12-17 HISTORY — PX: CARDIOVERSION: SHX1299

## 2016-12-17 LAB — BASIC METABOLIC PANEL
Anion gap: 12 (ref 5–15)
BUN: 16 mg/dL (ref 6–20)
CO2: 26 mmol/L (ref 22–32)
CREATININE: 0.96 mg/dL (ref 0.44–1.00)
Calcium: 9.8 mg/dL (ref 8.9–10.3)
Chloride: 102 mmol/L (ref 101–111)
GFR calc Af Amer: >60 mL/min (ref >60)
GFR, EST NON AFRICAN AMERICAN: 58 mL/min — AB (ref >60)
Glucose, Bld: 133 mg/dL — ABNORMAL HIGH (ref 65–99)
Potassium: 4 mmol/L (ref 3.5–5.1)
SODIUM: 140 mmol/L (ref 135–145)

## 2016-12-17 LAB — CBC
HCT: 43.4 % (ref 36.0–46.0)
Hemoglobin: 14.7 g/dL (ref 12.0–15.0)
MCH: 31.4 pg (ref 26.0–34.0)
MCHC: 33.9 g/dL (ref 30.0–36.0)
MCV: 92.7 fL (ref 78.0–100.0)
PLATELETS: 228 10*3/uL (ref 150–400)
RBC: 4.68 MIL/uL (ref 3.87–5.11)
RDW: 12.9 % (ref 11.5–15.5)
WBC: 7.6 10*3/uL (ref 4.0–10.5)

## 2016-12-17 LAB — PROTIME-INR
INR: 2.73
PROTHROMBIN TIME: 29.5 s — AB (ref 11.4–15.2)

## 2016-12-17 SURGERY — CARDIOVERSION
Anesthesia: General

## 2016-12-17 MED ORDER — LIDOCAINE HCL (CARDIAC) 20 MG/ML IV SOLN
INTRAVENOUS | Status: DC | PRN
  Administered 2016-12-17: 40 mg via INTRAVENOUS

## 2016-12-17 MED ORDER — PROPOFOL 10 MG/ML IV BOLUS
INTRAVENOUS | Status: DC | PRN
  Administered 2016-12-17: 50 mg via INTRAVENOUS

## 2016-12-17 MED ORDER — SODIUM CHLORIDE 0.9 % IV SOLN
INTRAVENOUS | Status: DC | PRN
  Administered 2016-12-17: 12:00:00 via INTRAVENOUS

## 2016-12-17 NOTE — CV Procedure (Signed)
    Cardioversion Note  Julia BouchardBarbara K Rose [id number] [birth year]  Procedure: DC Cardioversion Indications: Atrial fib   Procedure Details Consent: Obtained Time Out: Verified patient identification, verified procedure, site/side was marked, verified correct patient position, special equipment/implants available, Radiology Safety Procedures followed,  medications/allergies/relevent history reviewed, required imaging and test results available.  Performed  The patient has been on adequate anticoagulation.  The patient received Lidocaine 40 mg followed by Propofol 50 mg IV  for sedation.  Synchronous cardioversion was performed at 120  joules.  The cardioversion was successful .     Complications: No apparent complications Patient did tolerate procedure well.   Julia MixerPhilip J. Rose, Julia HagemanJr., MD, [facility] 12/17/2016, 12:05 PM

## 2016-12-17 NOTE — Discharge Instructions (Signed)
Electrical Cardioversion, Care After °This sheet gives you information about how to care for yourself after your procedure. Your health care provider may also give you more specific instructions. If you have problems or questions, contact your health care provider. °What can I expect after the procedure? °After the procedure, it is common to have: °· Some redness on the skin where the shocks were given. °Follow these instructions at home: °· Do not drive for 24 hours if you were given a medicine to help you relax (sedative). °· Take over-the-counter and prescription medicines only as told by your health care provider. °· Ask your health care provider how to check your pulse. Check it often. °· Rest for 48 hours after the procedure or as told by your health care provider. °· Avoid or limit your caffeine use as told by your health care provider. °Contact a health care provider if: °· You feel like your heart is beating too quickly or your pulse is not regular. °· You have a serious muscle cramp that does not go away. °Get help right away if: °· You have discomfort in your chest. °· You are dizzy or you feel faint. °· You have trouble breathing or you are short of breath. °· Your speech is slurred. °· You have trouble moving an arm or leg on one side of your body. °· Your fingers or toes turn cold or blue. °This information is not intended to replace advice given to you by your health care provider. Make sure you discuss any questions you have with your health care provider. °Document Released: 05/03/2013 Document Revised: 02/14/2016 Document Reviewed: 01/17/2016 °Elsevier Interactive Patient Education © 2017 [employer] ° °

## 2016-12-17 NOTE — Anesthesia Procedure Notes (Signed)
Date/Time: 12/17/2016 11:50 AM Performed by: Carmela RimaMARTINELLI, Kymberlie Brazeau F Pre-anesthesia Checklist: Timeout performed, Patient being monitored, Suction available, Emergency Drugs available and Patient identified Patient Re-evaluated:Patient Re-evaluated prior to inductionOxygen Delivery Method: Ambu bag Preoxygenation: Pre-oxygenation with 100% oxygen Intubation Type: IV induction Ventilation: Mask ventilation without difficulty and Oral airway inserted - appropriate to patient size

## 2016-12-17 NOTE — Anesthesia Postprocedure Evaluation (Signed)
Anesthesia Post Note  Patient: Julia BouchardBarbara K Echeverri  Procedure(s) Performed: Procedure(s) (LRB): CARDIOVERSION (N/A)  Patient location during evaluation: PACU Anesthesia Type: General Level of consciousness: awake and alert Pain management: pain level controlled Vital Signs Assessment: post-procedure vital signs reviewed and stable Respiratory status: spontaneous breathing, nonlabored ventilation and respiratory function stable Cardiovascular status: blood pressure returned to baseline and stable Postop Assessment: no signs of nausea or vomiting Anesthetic complications: no       Last Vitals:  Vitals:   12/17/16 1210 12/17/16 1215  BP: 108/63 116/65  Pulse: 66 64  Resp: 15 15  Temp:      Last Pain:  Vitals:   12/17/16 1102  TempSrc: Oral                 Hadlee Burback A.

## 2016-12-17 NOTE — Anesthesia Preprocedure Evaluation (Signed)
Anesthesia Evaluation  Patient identified by MRN, date of birth, ID band Patient awake    Reviewed: Allergy & Precautions, NPO status , Patient's Chart, lab work & pertinent test results  Airway Mallampati: II  TM Distance: >3 FB Neck ROM: Full    Dental  (+) Teeth Intact, Caps, Partial Upper   Pulmonary neg pulmonary ROS,    Pulmonary exam normal breath sounds clear to auscultation       Cardiovascular hypertension, Pt. on medications and Pt. on home beta blockers Normal cardiovascular exam+ dysrhythmias Atrial Fibrillation + Valvular Problems/Murmurs AS  Rhythm:Irregular Rate:Normal + Systolic murmurs AS- moderate Gradient RBBB   Neuro/Psych negative neurological ROS  negative psych ROS   GI/Hepatic negative GI ROS, Neg liver ROS,   Endo/Other  diabetes, Well Controlled, Type 2, Oral Hypoglycemic AgentsObesity Hyperlipidemia  Renal/GU negative Renal ROS  negative genitourinary   Musculoskeletal negative musculoskeletal ROS (+)   Abdominal (+) + obese,   Peds  Hematology On Coumadin   Anesthesia Other Findings   Reproductive/Obstetrics                             Anesthesia Physical Anesthesia Plan  ASA: III  Anesthesia Plan: General   Post-op Pain Management:    Induction: Intravenous  Airway Management Planned: Mask and Natural Airway  Additional Equipment:   Intra-op Plan:   Post-operative Plan:   Informed Consent: I have reviewed the patients History and Physical, chart, labs and discussed the procedure including the risks, benefits and alternatives for the proposed anesthesia with the patient or authorized representative who has indicated his/her understanding and acceptance.   Dental advisory given  Plan Discussed with: CRNA, Anesthesiologist and Surgeon  Anesthesia Plan Comments:         Anesthesia Quick Evaluation

## 2016-12-17 NOTE — Interval H&P Note (Signed)
History and Physical Interval Note:  12/17/2016 11:48 AM  Julia BouchardBarbara K Rose  has presented today for surgery, with the diagnosis of AFIB  The various methods of treatment have been discussed with the patient and family. After consideration of risks, benefits and other options for treatment, the patient has consented to  Procedure(s): CARDIOVERSION (N/A) as a surgical intervention .  The patient's history has been reviewed, patient examined, no change in status, stable for surgery.  I have reviewed the patient's chart and labs.  Questions were answered to the patient's satisfaction.     Kristeen MissPhilip Praneeth Bussey

## 2016-12-17 NOTE — H&P (View-Only) (Signed)
Patient ID: Julia Rose, female   DOB: 09/26/1945, 71 y.o.   MRN: [id number]     Primary Care Physician: Kaleen Mask, MD Referring Physician: Dr. Matt Holmes Julia Rose is a 71 y.o. female with a recent h/o of PAF. She is referred by Dr. Allyson Sabal for further evaluation 12/16.  The palpitations would come and go usually daily but do not last that long. She had been started on edoxaban 30 mg daily for a chadsvasc score of 4. She is aware of fatigue, shortness of breath and chest heaviness when in afib. 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   On f/u in afib clinic, 08/09/15, she reported one sustained afib episode for several hours, a few smaller ones.  Overall, not bothering her enough to consider an antiarrythmic. Daily BB was added. She is very conservative toward medicine. Continued endoxaban using this due to being most compatible  with pentaobarbital, but eventually converted to coumadin for cost issues.  Returned 10/07/15, in afib today but unaware.Rate controlled. 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  Returns to afib clinic 09/2016,  after seeing Dr. Allyson Sabal and pt feeling she has been in persistent afib since  fall. For the most part she is minimally symptomatic. She walks with her daughter when the weather is nice 30 mins to an hour and can do so with minimal symptoms. She had a repeat ECHO with normal EF and moderate aortic stenosis which she was told was   stable. She was asked to be seen here to discuss antiarrythmic therapy. 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 Continues warfarin for a CHA2DS2VASc score of at least 3. She deferred any change in therapy due to up coming trip.   Returned to afib clinic 4/4, and wishes to further discuss restoring SR. When she went on her trip, she noticed how short of breath with activity she had become since the trip last year. People in her group were always waiting for her to catch up  with them.She reports that when she makes the bed, she has to rest half way in between. Financial concerns would definitely play into what antiarrythmic she would be able to afford. After discussion of available antiarrythmic's, she was not ready to start AAD but was interested in pursuing cardioversion alone to see if she could return to SR and if her symptoms would improve in SR. She also has moderate aortic stenosis that may be playing a part in he symptoms. She is on warfarin and has had 3 therapeutic warfarin levels checked  with PCP, 5/2 - 2.4, 5/10 - 2.1, 5/16 - 2.3 and INR pending this am with pre procedure labs. Pt aware of risk vrs benefit of procedure and wants to proceed.  Today, she  with minimal  symptoms of palpitations, shortness of breath, no chest pain, orthopnea, PND, lower extremity edema, dizziness, presyncope, syncope, or neurologic sequela. The patient is tolerating medications without difficulties and is otherwise without complaint today.   Past Medical History:  Diagnosis Date  . Atrial fibrillation (HCC)   . Diabetes (HCC)   . Hyperlipidemia    statin intolerant  . Hypertension   . Moderate aortic stenosis   . Right bundle branch block    No past surgical history on file.  Current Outpatient Prescriptions  Medication Sig Dispense Refill  . Aspirin-Acetaminophen-Caffeine (EXCEDRIN MIGRAINE PO) Take by mouth.    Marland Kitchen FLUoxetine (PROZAC) 40 MG  capsule Take 40 mg by mouth daily.    . hydrochlorothiazide (HYDRODIURIL) 25 MG tablet Take 25 mg by mouth daily.    Marland Kitchen ibuprofen (ADVIL,MOTRIN) 200 MG tablet Take 200 mg by mouth every 6 (six) hours as needed for moderate pain.    . Loperamide HCl (ANTI-DIARRHEAL PO) Take by mouth as needed.    Marland Kitchen losartan (COZAAR) 100 MG tablet Take 50 mg by mouth at bedtime.  0  . lovastatin (MEVACOR) 20 MG tablet 1 tablet daily.    . meclizine (ANTIVERT) 25 MG tablet Take 25 mg by mouth 3 (three) times daily as needed for dizziness. Used rarely      . metFORMIN (GLUMETZA) 1000 MG (MOD) 24 hr tablet Take 1,000 mg by mouth daily with breakfast.    . metoprolol succinate (TOPROL XL) 25 MG 24 hr tablet Take 1 tablet (25 mg total) by mouth daily. 30 tablet 6  . PHENobarbital (LUMINAL) 97.2 MG tablet Take 1 tablet by mouth at bedtime.  4  . Simethicone (GAS RELIEF) 180 MG CAPS Take by mouth as needed.    . warfarin (COUMADIN) 5 MG tablet Take 1 tablet (5 mg total) by mouth daily. (Patient taking differently: Take 5 mg by mouth daily. 1.5 on Sunday and 2 tablets the rest of the week) 30 tablet 0   No current facility-administered medications for this encounter.     Allergies  Allergen Reactions  . Dilantin [Phenytoin] Hives    Whelps all over for two weeks  . Statins Other (See Comments)    dizzy    Social History   Social History  . Marital status: Married    Spouse name: N/A  . Number of children: N/A  . Years of education: N/A   Occupational History  . Not on file.   Social History Main Topics  . Smoking status: Never Smoker  . Smokeless tobacco: Never Used  . Alcohol use No  . Drug use: No  . Sexual activity: Not on file   Other Topics Concern  . Not on file   Social History Narrative  . No narrative on file    Family History  Problem Relation Age of Onset  . Kidney disease Mother   . Diabetes Mother   . Dementia Mother   . Hypertension Mother     ROS- All systems are reviewed and negative except as per the HPI above  Physical Exam: Vitals:   12/17/16 0953  BP: 122/60  Pulse: 82  Weight: 197 lb 3.2 oz (89.4 kg)  Height: 5\' 2"  (1.575 m)    GEN- The patient is well appearing, alert and oriented x 3 today.   Head- normocephalic, atraumatic Eyes-  Sclera clear, conjunctiva pink Ears- hearing intact Oropharynx- clear Neck- supple, no JVP, soft bruits, rt louder than left,? Possible radiation of systolic murmur Lymph- no cervical lymphadenopathy Lungs- Clear to ausculation bilaterally, normal work of  breathing Heart- Irregular rate and rhythm, 2/6 systolic murmur rubs or gallops, PMI not laterally displaced GI- soft, NT, ND, + BS Extremities- no clubbing, cyanosis, or edema MS- no significant deformity or atrophy Skin- no rash or lesion Psych- euthymic mood, full affect Neuro- strength and sensation are intact  EKG- afib at 88 bpm, RBBB, qrs int 128 ms, qtc 484 ms. Epic records reviewed Echo-Study Conclusions 08/31/16-  Study Conclusions  - Left ventricle: The cavity size was normal. Wall thickness was   normal. Systolic function was vigorous. The estimated ejection   fraction was in  the range of 65% to 70%. Wall motion was normal;   there were no regional wall motion abnormalities. The study is   not technically sufficient to allow evaluation of LV diastolic   function. - Aortic valve: Trileaflet; moderately thickened, severely   calcified leaflets. Valve mobility was restricted. There was   moderate stenosis. There was trivial regurgitation. Peak velocity   (S): 299 cm/s. Mean gradient (S): 17 mm Hg. - Mitral valve: There was mild regurgitation. - Left atrium: The atrium was mildly dilated. Volume/bsa, S: 37.2   ml/m^2. - Right atrium: The atrium was mildly dilated. - Tricuspid valve: There was moderate regurgitation. - Pulmonary arteries: Systolic pressure was mildly increased. PA   peak pressure: 35 mm Hg (S).  Impressions:  - Compared to the prior study, there has been no significant   interval change.   Assessment and Plan: 1 Persistent afib x  4-6 months, rate controlled Has become more short of breath with exertion and fatigued Discussed antiarrythmic therapy with pt, flecainide not an option due to RBBB at baseline, discussed  amiodarone, multaq/ Tikosyn, QTC length may be a concern. Pt did check on price of multaq/tikosyn and thought they were price prohibitive . She does not want the cost of  hospitalization for med administration. I am also considering if   Aortic stenosis, mod TR may be worse than appears on TTE and contributing to her shortness of breath as well. iF so, may be a valve repair/ maze candidate.  Will see what her symptoms are if she can return to SR with cardioversion, and go from there. Continue metoprolol succinate 25 mg daily  Cbc/bmet  2. HTN Stable  3. Chadsvasc score of at least 4 On warfarin now for cost issues associated with DOAC 5/2 - 2.4 5/10 - 2.1 5.16 - 2.3 4th INR pending today  4.Moderate aortic stenosis Per Dr. Allyson SabalBerry, scheduled to be repeated in one year    Elvina SidleDonna C. Matthew Folksarroll, ANP-C [facility] [address] [place], [place] [address] [phone]

## 2016-12-17 NOTE — Progress Notes (Signed)
Patient ID: Julia Rose, female   DOB: 09/26/1945, 71 y.o.   MRN: [id number]     Primary Care Physician: Kaleen Mask, MD Referring Physician: Dr. Matt Rose Julia Rose is a 71 y.o. female with a recent h/o of PAF. She is referred by Dr. Allyson Rose for further evaluation 12/16.  The palpitations would come and go usually daily but do not last that long. She had been started on edoxaban 30 mg daily for a chadsvasc score of 4. She is aware of fatigue, shortness of breath and chest heaviness when in afib. 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   On f/u in afib clinic, 08/09/15, she reported one sustained afib episode for several hours, a few smaller ones.  Overall, not bothering her enough to consider an antiarrythmic. Daily BB was added. She is very conservative toward medicine. Continued endoxaban using this due to being most compatible  with pentaobarbital, but eventually converted to coumadin for cost issues.  Returned 10/07/15, in afib today but unaware.Rate controlled. 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  Returns to afib clinic 09/2016,  after seeing Dr. Allyson Rose and pt feeling she has been in persistent afib since  fall. For the most part she is minimally symptomatic. She walks with her daughter when the weather is nice 30 mins to an hour and can do so with minimal symptoms. She had a repeat ECHO with normal EF and moderate aortic stenosis which she was told was   stable. She was asked to be seen here to discuss antiarrythmic therapy. 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 Continues warfarin for a CHA2DS2VASc score of at least 3. She deferred any change in therapy due to up coming trip.   Returned to afib clinic 4/4, and wishes to further discuss restoring SR. When she went on her trip, she noticed how short of breath with activity she had become since the trip last year. People in her group were always waiting for her to catch up  with them.She reports that when she makes the bed, she has to rest half way in between. Financial concerns would definitely play into what antiarrythmic she would be able to afford. After discussion of available antiarrythmic's, she was not ready to start AAD but was interested in pursuing cardioversion alone to see if she could return to SR and if her symptoms would improve in SR. She also has moderate aortic stenosis that may be playing a part in he symptoms. She is on warfarin and has had 3 therapeutic warfarin levels checked  with PCP, 5/2 - 2.4, 5/10 - 2.1, 5/16 - 2.3 and INR pending this am with pre procedure labs. Pt aware of risk vrs benefit of procedure and wants to proceed.  Today, she  with minimal  symptoms of palpitations, shortness of breath, no chest pain, orthopnea, PND, lower extremity edema, dizziness, presyncope, syncope, or neurologic sequela. The patient is tolerating medications without difficulties and is otherwise without complaint today.   Past Medical History:  Diagnosis Date  . Atrial fibrillation (HCC)   . Diabetes (HCC)   . Hyperlipidemia    statin intolerant  . Hypertension   . Moderate aortic stenosis   . Right bundle branch block    No past surgical history on file.  Current Outpatient Prescriptions  Medication Sig Dispense Refill  . Aspirin-Acetaminophen-Caffeine (EXCEDRIN MIGRAINE PO) Take by mouth.    Marland Kitchen FLUoxetine (PROZAC) 40 MG  capsule Take 40 mg by mouth daily.    . hydrochlorothiazide (HYDRODIURIL) 25 MG tablet Take 25 mg by mouth daily.    Marland Kitchen ibuprofen (ADVIL,MOTRIN) 200 MG tablet Take 200 mg by mouth every 6 (six) hours as needed for moderate pain.    . Loperamide HCl (ANTI-DIARRHEAL PO) Take by mouth as needed.    Marland Kitchen losartan (COZAAR) 100 MG tablet Take 50 mg by mouth at bedtime.  0  . lovastatin (MEVACOR) 20 MG tablet 1 tablet daily.    . meclizine (ANTIVERT) 25 MG tablet Take 25 mg by mouth 3 (three) times daily as needed for dizziness. Used rarely      . metFORMIN (GLUMETZA) 1000 MG (MOD) 24 hr tablet Take 1,000 mg by mouth daily with breakfast.    . metoprolol succinate (TOPROL XL) 25 MG 24 hr tablet Take 1 tablet (25 mg total) by mouth daily. 30 tablet 6  . PHENobarbital (LUMINAL) 97.2 MG tablet Take 1 tablet by mouth at bedtime.  4  . Simethicone (GAS RELIEF) 180 MG CAPS Take by mouth as needed.    . warfarin (COUMADIN) 5 MG tablet Take 1 tablet (5 mg total) by mouth daily. (Patient taking differently: Take 5 mg by mouth daily. 1.5 on Sunday and 2 tablets the rest of the week) 30 tablet 0   No current facility-administered medications for this encounter.     Allergies  Allergen Reactions  . Dilantin [Phenytoin] Hives    Whelps all over for two weeks  . Statins Other (See Comments)    dizzy    Social History   Social History  . Marital status: Married    Spouse name: N/A  . Number of children: N/A  . Years of education: N/A   Occupational History  . Not on file.   Social History Main Topics  . Smoking status: Never Smoker  . Smokeless tobacco: Never Used  . Alcohol use No  . Drug use: No  . Sexual activity: Not on file   Other Topics Concern  . Not on file   Social History Narrative  . No narrative on file    Family History  Problem Relation Age of Onset  . Kidney disease Mother   . Diabetes Mother   . Dementia Mother   . Hypertension Mother     ROS- All systems are reviewed and negative except as per the HPI above  Physical Exam: Vitals:   12/17/16 0953  BP: 122/60  Pulse: 82  Weight: 197 lb 3.2 oz (89.4 kg)  Height: 5\' 2"  (1.575 m)    GEN- The patient is well appearing, alert and oriented x 3 today.   Head- normocephalic, atraumatic Eyes-  Sclera clear, conjunctiva pink Ears- hearing intact Oropharynx- clear Neck- supple, no JVP, soft bruits, rt louder than left,? Possible radiation of systolic murmur Lymph- no cervical lymphadenopathy Lungs- Clear to ausculation bilaterally, normal work of  breathing Heart- Irregular rate and rhythm, 2/6 systolic murmur rubs or gallops, PMI not laterally displaced GI- soft, NT, ND, + BS Extremities- no clubbing, cyanosis, or edema MS- no significant deformity or atrophy Skin- no rash or lesion Psych- euthymic mood, full affect Neuro- strength and sensation are intact  EKG- afib at 88 bpm, RBBB, qrs int 128 ms, qtc 484 ms. Epic records reviewed Echo-Study Conclusions 08/31/16-  Study Conclusions  - Left ventricle: The cavity size was normal. Wall thickness was   normal. Systolic function was vigorous. The estimated ejection   fraction was in  the range of 65% to 70%. Wall motion was normal;   there were no regional wall motion abnormalities. The study is   not technically sufficient to allow evaluation of LV diastolic   function. - Aortic valve: Trileaflet; moderately thickened, severely   calcified leaflets. Valve mobility was restricted. There was   moderate stenosis. There was trivial regurgitation. Peak velocity   (S): 299 cm/s. Mean gradient (S): 17 mm Hg. - Mitral valve: There was mild regurgitation. - Left atrium: The atrium was mildly dilated. Volume/bsa, S: 37.2   ml/m^2. - Right atrium: The atrium was mildly dilated. - Tricuspid valve: There was moderate regurgitation. - Pulmonary arteries: Systolic pressure was mildly increased. PA   peak pressure: 35 mm Hg (S).  Impressions:  - Compared to the prior study, there has been no significant   interval change.   Assessment and Plan: 1 Persistent afib x  4-6 months, rate controlled Has become more short of breath with exertion and fatigued Discussed antiarrythmic therapy with pt, flecainide not an option due to RBBB at baseline, discussed  amiodarone, multaq/ Tikosyn, QTC length may be a concern. Pt did check on price of multaq/tikosyn and thought they were price prohibitive . She does not want the cost of  hospitalization for med administration. I am also considering if   Aortic stenosis, mod TR may be worse than appears on TTE and contributing to her shortness of breath as well. iF so, may be a valve repair/ maze candidate.  Will see what her symptoms are if she can return to SR with cardioversion, and go from there. Continue metoprolol succinate 25 mg daily  Cbc/bmet  2. HTN Stable  3. Chadsvasc score of at least 4 On warfarin now for cost issues associated with DOAC 5/2 - 2.4 5/10 - 2.1 5.16 - 2.3 4th INR pending today  4.Moderate aortic stenosis Per Dr. Allyson SabalBerry, scheduled to be repeated in one year    Elvina SidleDonna C. Matthew Folksarroll, ANP-C [facility] [address] [place], [place] [address] [phone]

## 2016-12-17 NOTE — Transfer of Care (Signed)
Immediate Anesthesia Transfer of Care Note  Patient: Julia BouchardBarbara K Rose  Procedure(s) Performed: Procedure(s): CARDIOVERSION (N/A)  Patient Location: Endoscopy Unit  Anesthesia Type:General  Level of Consciousness: awake, alert  and oriented  Airway & Oxygen Therapy: Patient Spontanous Breathing and Patient connected to nasal cannula oxygen  Post-op Assessment: Report given to RN, Post -op Vital signs reviewed and stable and Patient moving all extremities X 4  Post vital signs: Reviewed and stable  Last Vitals:  Vitals:   12/17/16 1102  BP: 128/84  Pulse: 79  Resp: 18  Temp: 36.6 C    Last Pain:  Vitals:   12/17/16 1102  TempSrc: Oral         Complications: No apparent anesthesia complications

## 2016-12-23 ENCOUNTER — Encounter (HOSPITAL_COMMUNITY): Payer: Self-pay | Admitting: Nurse Practitioner

## 2016-12-23 ENCOUNTER — Ambulatory Visit (HOSPITAL_COMMUNITY)
Admission: RE | Admit: 2016-12-23 | Discharge: 2016-12-23 | Disposition: A | Discharge status: Home / Self Care | Payer: Medicare Other | Source: Ambulatory Visit | Attending: Nurse Practitioner | Admitting: Nurse Practitioner

## 2016-12-23 VITALS — BP 114/74 | HR 97 | Ht 62.0 in | Wt 196.0 lb

## 2016-12-23 DIAGNOSIS — I481 Persistent atrial fibrillation: Secondary | ICD-10-CM

## 2016-12-23 DIAGNOSIS — Z79899 Other long term (current) drug therapy: Secondary | ICD-10-CM | POA: Diagnosis not present

## 2016-12-23 DIAGNOSIS — Z7984 Long term (current) use of oral hypoglycemic drugs: Secondary | ICD-10-CM | POA: Diagnosis not present

## 2016-12-23 DIAGNOSIS — Z7982 Long term (current) use of aspirin: Secondary | ICD-10-CM | POA: Diagnosis not present

## 2016-12-23 DIAGNOSIS — E785 Hyperlipidemia, unspecified: Secondary | ICD-10-CM | POA: Insufficient documentation

## 2016-12-23 DIAGNOSIS — I350 Nonrheumatic aortic (valve) stenosis: Secondary | ICD-10-CM | POA: Insufficient documentation

## 2016-12-23 DIAGNOSIS — Z7901 Long term (current) use of anticoagulants: Secondary | ICD-10-CM | POA: Insufficient documentation

## 2016-12-23 DIAGNOSIS — I10 Essential (primary) hypertension: Secondary | ICD-10-CM | POA: Diagnosis not present

## 2016-12-23 DIAGNOSIS — E119 Type 2 diabetes mellitus without complications: Secondary | ICD-10-CM | POA: Insufficient documentation

## 2016-12-23 DIAGNOSIS — I4819 Other persistent atrial fibrillation: Secondary | ICD-10-CM

## 2016-12-23 DIAGNOSIS — I4510 Unspecified right bundle-branch block: Secondary | ICD-10-CM | POA: Diagnosis not present

## 2016-12-23 NOTE — Progress Notes (Addendum)
Patient ID: Julia Rose, female   DOB: 1945-09-08, 71 y.o.   MRN: [id number]     Primary Care Physician: Kaleen Mask, MD Referring Physician: Dr. Matt Holmes Julia Rose is a 71 y.o. female with a recent h/o of PAF. She is referred by Dr. Allyson Sabal for further evaluation 12/16.  The palpitations would come and go usually daily but do not last that long. She had been started on edoxaban 30 mg daily for a chadsvasc score of 4. She is aware of fatigue, shortness of breath and chest heaviness when in afib. 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   On f/u in afib clinic, 08/09/15, she reported one sustained afib episode for several hours, a few smaller ones.  Overall, not bothering her enough to consider an antiarrythmic. Daily BB was added. She is very conservative toward medicine. Continued endoxaban using this due to being most compatible  with pentaobarbital, but eventually converted to coumadin for cost issues.  Returned 10/07/15, in afib today but unaware.Rate controlled. 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  Returns to afib clinic 09/2016,  after seeing Dr. Allyson Sabal and pt feeling she has been in persistent afib since  fall. For the most part she is minimally symptomatic. She walks with her daughter when the weather is nice 30 mins to an hour and can do so with minimal symptoms. She had a repeat ECHO with normal EF and moderate aortic stenosis which she was told was   stable. She was asked to be seen here to discuss antiarrythmic therapy. 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 Continues warfarin for a CHA2DS2VASc score of at least 3. She deferred any change in therapy due to up coming trip.   Returned to afib clinic 4/4, and wishes to further discuss restoring SR. When she went on her trip, she noticed how short of breath with activity she had become since the trip last year. People in her group were always waiting for her to catch up  with them.She reports that when she makes the bed, she has to rest half way in between. Financial concerns would definitely play into what antiarrythmic she would be able to afford. After discussion of available antiarrythmic's, she was not ready to start AAD but was interested in pursuing cardioversion alone to see if she could return to SR and if her symptoms would improve in SR. She also has moderate aortic stenosis that may be playing a part in he symptoms. She is on warfarin and has had 3 therapeutic warfarin levels checked  with PCP, 5/2 - 2.4, 5/10 - 2.1, 5/16 - 2.3 and INR pending this am with pre procedure labs. Pt aware of risk vrs benefit of procedure and wants to proceed.  F/u afib clinic 5/30 f/u cardioversion. The cardioversion was successful but unfortunately she did return to afib after 3 days. It was long enough for her to notice that she had more energy and less exertional dyspnea. This has made her more interested in pursing SR using an antiarrythmic which she was against before.  Today, she  with minimal  symptoms of palpitations, shortness of breath, no chest pain, orthopnea, PND, lower extremity edema, dizziness, presyncope, syncope, or neurologic sequela. The patient is tolerating medications without difficulties and is otherwise without complaint today.   Past Medical History:  Diagnosis Date  . Atrial fibrillation (HCC)   . Diabetes (HCC)   . Hyperlipidemia  statin intolerant  . Hypertension   . Moderate aortic stenosis   . Right bundle branch block    Past Surgical History:  Procedure Laterality Date  . CARDIOVERSION N/A 12/17/2016   Procedure: CARDIOVERSION;  Surgeon: Elease HashimotoNahser, Deloris PingPhilip J, MD;  [facility];  Service: Cardiovascular;  Laterality: N/A;    Current Outpatient Prescriptions  Medication Sig Dispense Refill  . Aspirin-Acetaminophen-Caffeine (EXCEDRIN MIGRAINE PO) Take by mouth.    Marland Kitchen. FLUoxetine (PROZAC) 40 MG capsule Take 40 mg by mouth daily.      . hydrochlorothiazide (HYDRODIURIL) 25 MG tablet Take 25 mg by mouth daily.    Marland Kitchen. ibuprofen (ADVIL,MOTRIN) 200 MG tablet Take 200 mg by mouth every 6 (six) hours as needed for moderate pain.    . Loperamide HCl (ANTI-DIARRHEAL PO) Take by mouth as needed.    Marland Kitchen. losartan (COZAAR) 100 MG tablet Take 50 mg by mouth at bedtime.  0  . lovastatin (MEVACOR) 20 MG tablet 1 tablet daily.    . metFORMIN (GLUMETZA) 1000 MG (MOD) 24 hr tablet Take 1,000 mg by mouth daily with breakfast.    . metoprolol succinate (TOPROL XL) 25 MG 24 hr tablet Take 1 tablet (25 mg total) by mouth daily. 30 tablet 6  . PHENobarbital (LUMINAL) 97.2 MG tablet Take 1 tablet by mouth at bedtime.  4  . Simethicone (GAS RELIEF) 180 MG CAPS Take by mouth as needed.    . warfarin (COUMADIN) 5 MG tablet Take 1 tablet (5 mg total) by mouth daily. (Patient taking differently: Take 5 mg by mouth daily. 1.5 on Sunday and 2 tablets the rest of the week) 30 tablet 0  . meclizine (ANTIVERT) 25 MG tablet Take 25 mg by mouth 3 (three) times daily as needed for dizziness. Used rarely     No current facility-administered medications for this encounter.     Allergies  Allergen Reactions  . Dilantin [Phenytoin] Hives    Whelps all over for two weeks  . Statins Other (See Comments)    dizzy    Social History   Social History  . Marital status: Married    Spouse name: N/A  . Number of children: N/A  . Years of education: N/A   Occupational History  . Not on file.   Social History Main Topics  . Smoking status: Never Smoker  . Smokeless tobacco: Never Used  . Alcohol use No  . Drug use: No  . Sexual activity: Not on file   Other Topics Concern  . Not on file   Social History Narrative  . No narrative on file    Family History  Problem Relation Age of Onset  . Kidney disease Mother   . Diabetes Mother   . Dementia Mother   . Hypertension Mother     ROS- All systems are reviewed and negative except as per the HPI  above  Physical Exam: Vitals:   12/23/16 1030  BP: 114/74  Pulse: 97  SpO2: 92%  Weight: 196 lb (88.9 kg)  Height: 5\' 2"  (1.575 m)    GEN- The patient is well appearing, alert and oriented x 3 today.   Head- normocephalic, atraumatic Eyes-  Sclera clear, conjunctiva pink Ears- hearing intact Oropharynx- clear Neck- supple, no JVP, soft bruits, rt louder than left,? Possible radiation of systolic murmur Lymph- no cervical lymphadenopathy Lungs- Clear to ausculation bilaterally, normal work of breathing Heart- Irregular rate and rhythm, 2/6 systolic murmur rubs or gallops, PMI not laterally displaced GI- soft,  NT, ND, + BS Extremities- no clubbing, cyanosis, or edema MS- no significant deformity or atrophy Skin- no rash or lesion Psych- euthymic mood, full affect Neuro- strength and sensation are intact  EKG- afib at 88 bpm, RBBB, qrs int 128 ms, qtc 484 ms. Epic records reviewed Echo-Study Conclusions 08/31/16-  Study Conclusions  - Left ventricle: The cavity size was normal. Wall thickness was   normal. Systolic function was vigorous. The estimated ejection   fraction was in the range of 65% to 70%. Wall motion was normal;   there were no regional wall motion abnormalities. The study is   not technically sufficient to allow evaluation of LV diastolic   function. - Aortic valve: Trileaflet; moderately thickened, severely   calcified leaflets. Valve mobility was restricted. There was   moderate stenosis. There was trivial regurgitation. Peak velocity   (S): 299 cm/s. Mean gradient (S): 17 mm Hg. - Mitral valve: There was mild regurgitation. - Left atrium: The atrium was mildly dilated. Volume/bsa, S: 37.2   ml/m^2. - Right atrium: The atrium was mildly dilated. - Tricuspid valve: There was moderate regurgitation. - Pulmonary arteries: Systolic pressure was mildly increased. PA   peak pressure: 35 mm Hg (S).  Impressions:  - Compared to the prior study, there has  been no significant   interval change.   Assessment and Plan: 1 Persistent afib x  4-6 months, rate controlled Has become more short of breath with exertion and fatigued Discussed antiarrythmic therapy with pt, flecainide not an option due to RBBB at baseline, discussed  amiodarone, multaq/ Tikosyn,sotalol, QTC length may be a concern. Pt is on prozac which would be an issue if qt prolonging drug is used. I feel that multaq may not have the strength to keep pt in rhythm. From a price standpoint, sotalol is the only drug that pt may be able to afford.  Pt did check on price of multaq/tikosyn and thought they were price prohibitive .  Continue metoprolol succinate 25 mg daily    2. HTN Stable  3. Chadsvasc score of at least 4 Continue warfarin   4.Moderate aortic stenosis Per Dr. Allyson Sabal, scheduled to be repeated 2/19  6/1- I discussed pt with Dr. Johney Frame. He feels that the RBBB is contributing to length of qtc and if she could at least reduce amount of prozac to 20 mg a day, or switch to another antidepressant that is not known to prolong the QT as much, that we could go ahead with plans for sotalol. Pt will discuss with her PCP and get back to me as to her wishes. She shared with me that she is scared of drugs 2/2 her son dying of anaphylaxis from initiation of lisinopril.  Elvina Sidle [facility] [address] [place], [place] [address] [phone]

## 2016-12-24 NOTE — Addendum Note (Signed)
Encounter addended by: Newman Niparroll, Caiya Bettes C, NP on: 12/24/2016 12:57 PM<BR>    Actions taken: LOS modified

## 2016-12-25 NOTE — Addendum Note (Signed)
Encounter addended by: Newman Niparroll, Janyth Riera C, NP on: 12/25/2016 12:36 PM<BR>    Actions taken: Sign clinical note

## 2017-01-15 ENCOUNTER — Ambulatory Visit (HOSPITAL_COMMUNITY)
Admission: RE | Admit: 2017-01-15 | Discharge: 2017-01-15 | Disposition: A | Discharge status: Home / Self Care | Payer: Medicare Other | Source: Ambulatory Visit | Attending: Nurse Practitioner | Admitting: Nurse Practitioner

## 2017-01-15 ENCOUNTER — Encounter (HOSPITAL_COMMUNITY): Payer: Self-pay | Admitting: Nurse Practitioner

## 2017-01-15 DIAGNOSIS — I4891 Unspecified atrial fibrillation: Secondary | ICD-10-CM | POA: Diagnosis present

## 2017-01-15 DIAGNOSIS — I4510 Unspecified right bundle-branch block: Secondary | ICD-10-CM | POA: Insufficient documentation

## 2017-01-15 DIAGNOSIS — R9431 Abnormal electrocardiogram [ECG] [EKG]: Secondary | ICD-10-CM | POA: Insufficient documentation

## 2017-01-15 NOTE — Progress Notes (Addendum)
Pt in for repeat EKG after stopping Prozac and decreased the phenobarbital.   EKG to be reviewed by Rudi Cocoonna Cherish Runde, NP  Pt in for ekg to look at qtc for possible sotalol in the next 4-8 weeks to see if improvement off the above meds. Shows improvement  With qtc now at 475 ms. Presence of RBBB may be contributing to longer appearing qtc. Will review with Dr. Johney FrameAllred. Pt will need 4 weeks uninterrupted INR's prior to setting date for admission.

## 2017-04-01 ENCOUNTER — Telehealth: Payer: Self-pay | Admitting: Pharmacist

## 2017-04-01 NOTE — Telephone Encounter (Addendum)
Medication list reviewed in anticipation of upcoming sotalol initiation. Patient is using loperamide as needed which can be QT prolonging - would advised to limit/avoid. Patient is not taking any other contraindicated or QTc prolonging medications pending wean of prozac (which is currently not listed. Of note phenobarbital can decrease concentrations of betablockers and will need to keep this in mind if dose adjustments are made. Patient is anticoagulated on warfarin with 4 reported therapeutic INRs at PCP.   Please ensure that patient has not missed any anticoagulation doses in the 3 weeks prior to sotalol initiation.

## 2017-04-06 ENCOUNTER — Encounter (HOSPITAL_COMMUNITY): Payer: Self-pay | Admitting: Nurse Practitioner

## 2017-04-06 ENCOUNTER — Encounter (HOSPITAL_COMMUNITY): Payer: Self-pay | Admitting: General Practice

## 2017-04-06 ENCOUNTER — Inpatient Hospital Stay (HOSPITAL_COMMUNITY)
Admission: AD | Admit: 2017-04-06 | Discharge: 2017-04-08 | DRG: 310 | Disposition: A | Discharge status: Home / Self Care | Payer: Medicare Other | Source: Ambulatory Visit | Attending: Internal Medicine | Admitting: Internal Medicine

## 2017-04-06 ENCOUNTER — Ambulatory Visit (HOSPITAL_COMMUNITY)
Admission: RE | Admit: 2017-04-06 | Discharge: 2017-04-06 | Disposition: A | Discharge status: Home / Self Care | Payer: Medicare Other | Source: Ambulatory Visit | Attending: Nurse Practitioner | Admitting: Nurse Practitioner

## 2017-04-06 VITALS — BP 118/72 | HR 123 | Ht 62.0 in | Wt 204.0 lb

## 2017-04-06 DIAGNOSIS — I481 Persistent atrial fibrillation: Principal | ICD-10-CM | POA: Diagnosis present

## 2017-04-06 DIAGNOSIS — E785 Hyperlipidemia, unspecified: Secondary | ICD-10-CM | POA: Diagnosis present

## 2017-04-06 DIAGNOSIS — I350 Nonrheumatic aortic (valve) stenosis: Secondary | ICD-10-CM

## 2017-04-06 DIAGNOSIS — Z79899 Other long term (current) drug therapy: Secondary | ICD-10-CM

## 2017-04-06 DIAGNOSIS — I4510 Unspecified right bundle-branch block: Secondary | ICD-10-CM | POA: Diagnosis present

## 2017-04-06 DIAGNOSIS — I10 Essential (primary) hypertension: Secondary | ICD-10-CM | POA: Diagnosis present

## 2017-04-06 DIAGNOSIS — I4819 Other persistent atrial fibrillation: Secondary | ICD-10-CM

## 2017-04-06 DIAGNOSIS — I480 Paroxysmal atrial fibrillation: Secondary | ICD-10-CM | POA: Diagnosis present

## 2017-04-06 DIAGNOSIS — E119 Type 2 diabetes mellitus without complications: Secondary | ICD-10-CM | POA: Diagnosis present

## 2017-04-06 HISTORY — DX: Unspecified osteoarthritis, unspecified site: M19.90

## 2017-04-06 HISTORY — DX: Migraine, unspecified, not intractable, without status migrainosus: G43.909

## 2017-04-06 HISTORY — DX: Unspecified atrial fibrillation: I48.91

## 2017-04-06 HISTORY — DX: Other intervertebral disc degeneration, lumbar region: M51.36

## 2017-04-06 HISTORY — DX: Other intervertebral disc degeneration, lumbar region without mention of lumbar back pain or lower extremity pain: M51.369

## 2017-04-06 HISTORY — DX: Cardiac murmur, unspecified: R01.1

## 2017-04-06 HISTORY — DX: Headache, unspecified: R51.9

## 2017-04-06 HISTORY — DX: Headache: R51

## 2017-04-06 HISTORY — DX: Dorsalgia, unspecified: M54.9

## 2017-04-06 HISTORY — DX: Type 2 diabetes mellitus without complications: E11.9

## 2017-04-06 HISTORY — DX: Unspecified convulsions: R56.9

## 2017-04-06 HISTORY — DX: Other cervical disc degeneration, unspecified cervical region: M50.30

## 2017-04-06 HISTORY — DX: Unspecified asthma, uncomplicated: J45.909

## 2017-04-06 HISTORY — DX: Other intervertebral disc degeneration, thoracic region: M51.34

## 2017-04-06 HISTORY — DX: Other chronic pain: G89.29

## 2017-04-06 LAB — BASIC METABOLIC PANEL
Anion gap: 15 (ref 5–15)
BUN: 21 mg/dL — AB (ref 6–20)
CHLORIDE: 101 mmol/L (ref 101–111)
CO2: 23 mmol/L (ref 22–32)
CREATININE: 1.12 mg/dL — AB (ref 0.44–1.00)
Calcium: 9.7 mg/dL (ref 8.9–10.3)
GFR calc Af Amer: 56 mL/min — ABNORMAL LOW (ref >60)
GFR calc non Af Amer: 48 mL/min — ABNORMAL LOW (ref >60)
GLUCOSE: 150 mg/dL — AB (ref 65–99)
POTASSIUM: 4.2 mmol/L (ref 3.5–5.1)
SODIUM: 139 mmol/L (ref 135–145)

## 2017-04-06 LAB — PROTIME-INR
INR: 2.36
Prothrombin Time: 25.6 seconds — ABNORMAL HIGH (ref 11.4–15.2)

## 2017-04-06 LAB — MAGNESIUM: MAGNESIUM: 1.6 mg/dL — AB (ref 1.7–2.4)

## 2017-04-06 MED ORDER — PRAVASTATIN SODIUM 20 MG PO TABS
10.0000 mg | ORAL_TABLET | Freq: Every day | ORAL | Status: DC
  Administered 2017-04-07: 10 mg via ORAL
  Filled 2017-04-06: qty 1

## 2017-04-06 MED ORDER — LOSARTAN POTASSIUM 50 MG PO TABS
50.0000 mg | ORAL_TABLET | Freq: Every day | ORAL | Status: DC
  Administered 2017-04-06 – 2017-04-07 (×2): 50 mg via ORAL
  Filled 2017-04-06 (×2): qty 1

## 2017-04-06 MED ORDER — METFORMIN HCL ER 500 MG PO TB24
1500.0000 mg | ORAL_TABLET | Freq: Every day | ORAL | Status: DC
Start: 2017-04-07 — End: 2017-04-08
  Administered 2017-04-07 – 2017-04-08 (×2): 1500 mg via ORAL
  Filled 2017-04-06 (×2): qty 3

## 2017-04-06 MED ORDER — WARFARIN SODIUM 10 MG PO TABS
10.0000 mg | ORAL_TABLET | ORAL | Status: AC
  Administered 2017-04-06: 10 mg via ORAL
  Filled 2017-04-06: qty 1

## 2017-04-06 MED ORDER — OFF THE BEAT BOOK
Freq: Once | Status: AC
  Administered 2017-04-06: 22:00:00
  Filled 2017-04-06: qty 1

## 2017-04-06 MED ORDER — HYDROCHLOROTHIAZIDE 25 MG PO TABS
25.0000 mg | ORAL_TABLET | Freq: Every day | ORAL | Status: DC
  Administered 2017-04-07 – 2017-04-08 (×2): 25 mg via ORAL
  Filled 2017-04-06 (×2): qty 1

## 2017-04-06 MED ORDER — WARFARIN - PHARMACIST DOSING INPATIENT
Freq: Every day | Status: DC
  Administered 2017-04-07: 18:00:00

## 2017-04-06 MED ORDER — SOTALOL HCL 120 MG PO TABS
120.0000 mg | ORAL_TABLET | Freq: Two times a day (BID) | ORAL | Status: DC
  Administered 2017-04-06 – 2017-04-08 (×4): 120 mg via ORAL
  Filled 2017-04-06 (×4): qty 1

## 2017-04-06 NOTE — H&P (Signed)
Patient ID: Julia Rose, female   DOB: 1946-07-12, 71 y.o.   MRN: [id number]     Primary Care Physician: Julia MaskElkins, Wilson Oliver, MD Referring Physician: Dr. Matt Rose   Julia Rose is a 71 y.o. female with a recent h/o of PAF. She presents for further management of afib with RVR.   She has frequent afib.  She has significant RVR and is quite symptomatic.  She has been cardioverted without improvement.  She reports fatigue and decreased exercise tolerance with her afib. Recent EKG shows afib with RVR this am with 123 bpm, pt has been rate controlled in past.Qtc is 512 but with RBBB and RVR. Qtc is SR has ranged from 414 to 474 but with RBBB contributing.  Today, she  with minimal  symptoms of palpitations, shortness of breath, no chest pain, orthopnea, PND, lower extremity edema, dizziness, presyncope, syncope, or neurologic sequela. The patient is tolerating medications without difficulties and is otherwise without complaint today.   Past Medical History:  Diagnosis Date  . Atrial fibrillation (HCC)   . Diabetes (HCC)   . Hyperlipidemia    statin intolerant  . Hypertension   . Moderate aortic stenosis   . Right bundle branch block    Past Surgical History:  Procedure Laterality Date  . CARDIOVERSION N/A 12/17/2016   Procedure: CARDIOVERSION;  Surgeon: Vesta MixerNahser, Julia J, MD;  [facility];  Service: Cardiovascular;  Laterality: N/A;    Current Facility-Administered Medications  Medication Dose Route Frequency Provider Last Rate Last Dose  . hydrochlorothiazide (HYDRODIURIL) tablet 25 mg  25 mg Oral Daily Julia Rose, Julia E, NP      . losartan (COZAAR) tablet 50 mg  50 mg Oral QHS Julia Rose, Julia E, NP      . Melene Muller[START ON 04/07/2017] metFORMIN (GLUCOPHAGE-XR) 24 hr tablet 1,500 mg  1,500 mg Oral Q breakfast Julia Rose, Julia E, NP      . off the beat book   Does not apply Once Julia Rose, Abaigeal Moomaw, MD      . Melene Muller[START ON 04/07/2017] pravastatin (PRAVACHOL) tablet 10 mg  10 mg Oral q1800 Julia Rose, Julia E, NP       . Melene Muller[START ON 04/07/2017] Warfarin - Pharmacist Dosing Inpatient   Does not apply [id number] Julia Rose, Julia Jaster, MD        Allergies  Allergen Reactions  . Dilantin [Phenytoin] Hives and Other (See Comments)    Whelps all over for two weeks  . Statins Other (See Comments)    High dose statin - dizziness    Social History   Social History  . Marital status: Married    Spouse name: N/A  . Number of children: N/A  . Years of education: N/A   Occupational History  . Not on file.   Social History Main Topics  . Smoking status: Never Smoker  . Smokeless tobacco: Never Used  . Alcohol use No  . Drug use: No  . Sexual activity: Not on file   Other Topics Concern  . Not on file   Social History Narrative  . No narrative on file    Family History  Problem Relation Age of Onset  . Kidney disease Mother   . Diabetes Mother   . Dementia Mother   . Hypertension Mother     ROS- All systems are reviewed and negative except as per the HPI above  Physical Exam: Vitals:   04/06/17 1756 04/06/17 2051  BP: 134/77 (!) 135/57  Pulse: 92 81  Resp:  18  Temp: 98 F (36.7 C) 98.1 F (36.7 C)  TempSrc: Oral Oral  SpO2: 97% 97%  Weight: 204 lb (92.5 kg)   Height:  (1.575 m)     GEN- The patient is well appearing, alert and oriented x 3 today.   Head- normocephalic, atraumatic Eyes-  Sclera clear, conjunctiva pink Ears- hearing intact Oropharynx- clear Neck- supple, no JVP, soft bruits, rt louder than left,? Possible radiation of systolic murmur Lymph- no cervical lymphadenopathy Lungs- Clear to ausculation bilaterally, normal work of breathing Heart- Irregular rate and rhythm, 2/6 systolic murmur rubs or gallops, PMI not laterally displaced GI- soft, NT, ND, + BS Extremities- no clubbing, cyanosis, or edema MS- no significant deformity or atrophy Skin- no rash or lesion Psych- euthymic mood, full affect Neuro- strength and sensation are intact  EKG- afib at 123 bpm,  RBBB, qrs int 138 ms, qtc 512 ms. Epic records reviewed Echo-Study Conclusions 08/31/16-  Study Conclusions  - Left ventricle: The cavity size was normal. Wall thickness was   normal. Systolic function was vigorous. The estimated ejection   fraction was in the range of 65% to 70%. Wall motion was normal;   there were no regional wall motion abnormalities. The study is   not technically sufficient to allow evaluation of LV diastolic   function. - Aortic valve: Trileaflet; moderately thickened, severely   calcified leaflets. Valve mobility was restricted. There was   moderate stenosis. There was trivial regurgitation. Peak velocity   (S): 299 cm/s. Mean gradient (S): 17 mm Hg. - Mitral valve: There was mild regurgitation. - Left atrium: The atrium was mildly dilated. Volume/bsa, S: 37.2   ml/m^2. - Right atrium: The atrium was mildly dilated. - Tricuspid valve: There was moderate regurgitation. - Pulmonary arteries: Systolic pressure was mildly increased. PA   peak pressure: 35 mm Hg (S).  Impressions:  - Compared to the prior study, there has been no significant   interval change.   Assessment and Plan: 1 Persistent afib x one year  Pt did feel better when cardioverted months ago but with ERAF She has been resistant to AAD's She wanted to put off sotalol until early fall due to keeping grandchildren for the summer Has become more short of breath with exertion and fatigued Discussed antiarrythmic therapy with pt, flecainide not an option due to RBBB at baseline, discussed  amiodarone, multaq/ Tikosyn,sotalol, QTC length may be a concern. RBBB contributing to overall length. Pt has weaned off prozac. I feel that multaq may not have the strength to keep pt in rhythm. From a price standpoint, sotalol is the only drug that pt may be able to afford.  Pt did check on price of multaq/tikosyn and thought they were price prohibitive .  Continue metoprolol succinate 25 mg daily  Bmet/mag  today, mag low at 1.6. She will take 200 mg magnesium while she is waiting for bed placement for later today INR's therapeutic x 4 weeks, today at 2.36, 8/28- 3.4, 8/23-3.1, 8/16 2.9  2. HTN Stable  3. Chadsvasc score of at least 4 Continue warfarin  4.Moderate aortic stenosis Per Dr. Allyson Sabal, scheduled to be repeated 2/19  12/26/15- I discussed pt with Dr. Johney Frame. He feels that the RBBB is contributing to length of qtc,  , that we could go ahead with plans for sotalol. . She shared with me that she is scared of drugs 2/2 her son dying of anaphylaxis from initiation of lisinopril.  Will admit later today when bed available  [facility] [address] [place], [place] [address] [phone]   I have seen, examined the patient, and reviewed the above assessment and plan. On exam, tachycardiac irregular rhythm.  She has symptomatic persistent afib. Changes to above are made where necessary.  I would advise admission for sotalol at that time.  Risks of medicine discussed with patient who wishes to proceed.  Co Sign: Julia Range, MD 04/06/2017 9:12 PM

## 2017-04-06 NOTE — Progress Notes (Signed)
Patient ID: Julia Rose, female   DOB: Apr 06, 1946, 71 y.o.   MRN: [id number]     Primary Care Physician: Julia Mask, MD Referring Physician: Dr. Matt Rose ARRYN Rose is a 71 y.o. female with a recent h/o of PAF. She is referred by Julia Rose for further evaluation 07/12/15.  The palpitations would come and go usually daily but do not last that long. She had been started on edoxaban 30 mg daily for a chadsvasc score of 4. She is aware of fatigue, shortness of breath and chest heaviness when in afib. 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   On f/u in afib clinic, 08/09/15, she reported one sustained afib episode for several hours, a few smaller ones.  Overall, not bothering her enough to consider an antiarrythmic. Daily BB was added. She is very conservative toward medicine. Continued endoxaban using this due to being most compatible  with pentaobarbital, but eventually converted to coumadin for cost issues.  Returned 10/07/15, in afib today but unaware.Rate controlled. 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  Returns to afib clinic 09/2016,  after seeing Julia Rose and pt feeling she has been in persistent afib since  fall. For the most part she is minimally symptomatic. She walks with her daughter when the weather is nice 30 mins to an hour and can do so with minimal symptoms. She had a repeat ECHO with normal EF and moderate aortic stenosis which she was told was  stable. She was asked to be seen here to discuss antiarrythmic therapy. 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 Continues warfarin for a CHA2DS2VASc score of at least 3. She deferred any change in therapy due to up coming trip.   Returned to afib clinic 10/28/16, and wishes to further discuss restoring SR. When she went on her trip, she noticed how short of breath with activity she had become since the trip last year. People in her group were always waiting for her to catch  up with them.She reports that when she makes the bed, she has to rest half way in between. Financial concerns would definitely play into what antiarrythmic she would be able to afford. After discussion of available antiarrythmic's, she was not ready to start AAD but was interested in pursuing cardioversion alone to see if she could return to SR and if her symptoms would improve in SR. She also has moderate aortic stenosis that may be playing a part in he symptoms, but by echo Julia Rose told her this is stable.. She is on warfarin and has had 3 therapeutic warfarin levels checked  with PCP, 5/2 - 2.4, 5/10 - 2.1, 5/16 - 2.3 and INR pending this am with pre procedure labs. Pt aware of risk vrs benefit of procedure and wants to proceed.  F/u afib clinic 5/30 f/u cardioversion. The cardioversion was successful but unfortunately she did return to afib after 3 days. It was long enough for her to notice that she had more energy and less exertional dyspnea. This has made her more interested in pursing SR using an antiarrythmic which she was against before.  F/u in afib clinic, pt has been interested in hospitalization for sotalol but kept her grandkids for the summer and wanted to wait until fall. Drugs were screened, it was recommended that she wean off Prozac, which she has done without any untoward effects.She has had 3 weekly therapeutic INR's with PCP and  INR this am also therapeutic. She has noted that she has grown more dyspneic with exertion this past summer. EKG shows afib with RVR this am with 123 bpm, pt has been rate controlled in past.Qtc is 512 but with RBBB and RVR. Qtc is SR has ranged from 414 to 474 but with RBBB contributing.  Today, she  with minimal  symptoms of palpitations, shortness of breath, no chest pain, orthopnea, PND, lower extremity edema, dizziness, presyncope, syncope, or neurologic sequela. The patient is tolerating medications without difficulties and is otherwise without complaint  today.   Past Medical History:  Diagnosis Date  . Atrial fibrillation (HCC)   . Diabetes (HCC)   . Hyperlipidemia    statin intolerant  . Hypertension   . Moderate aortic stenosis   . Right bundle branch block    Past Surgical History:  Procedure Laterality Date  . CARDIOVERSION N/A 12/17/2016   Procedure: CARDIOVERSION;  Surgeon: Julia Hashimoto Deloris Ping, MD;  Location: [facility] ENDOSCOPY;  Service: Cardiovascular;  Laterality: N/A;    Current Outpatient Prescriptions  Medication Sig Dispense Refill  . Aspirin-Acetaminophen-Caffeine (EXCEDRIN MIGRAINE PO) Take by mouth.    . hydrochlorothiazide (HYDRODIURIL) 25 MG tablet Take 25 mg by mouth daily.    Marland Kitchen ibuprofen (ADVIL,MOTRIN) 200 MG tablet Take 200 mg by mouth every 6 (six) hours as needed for moderate pain.    . Loperamide HCl (ANTI-DIARRHEAL PO) Take by mouth as needed.    Marland Kitchen losartan (COZAAR) 100 MG tablet Take 50 mg by mouth at bedtime.  0  . lovastatin (MEVACOR) 20 MG tablet 1 tablet daily.    . metFORMIN (GLUMETZA) 1000 MG (MOD) 24 hr tablet Take 1,500 mg by mouth daily with breakfast.     . metoprolol succinate (TOPROL XL) 25 MG 24 hr tablet Take 1 tablet (25 mg total) by mouth daily. 30 tablet 6  . nystatin cream (MYCOSTATIN) Apply 1 application topically daily as needed for dry skin.    Marland Kitchen PHENobarbital (LUMINAL) 97.2 MG tablet Take 0.5 tablets by mouth at bedtime.   4  . Simethicone (GAS RELIEF) 180 MG CAPS Take by mouth as needed.    . warfarin (COUMADIN) 5 MG tablet Take 1 tablet (5 mg total) by mouth daily. (Patient taking differently: Take 5 mg by mouth daily. 1.5 on Sunday and 2 tablets the rest of the week) 30 tablet 0   No current facility-administered medications for this encounter.     Allergies  Allergen Reactions  . Dilantin [Phenytoin] Hives    Whelps all over for two weeks    Social History   Social History  . Marital status: Married    Spouse name: N/A  . Number of children: N/A  . Years of education: N/A    Occupational History  . Not on file.   Social History Main Topics  . Smoking status: Never Smoker  . Smokeless tobacco: Never Used  . Alcohol use No  . Drug use: No  . Sexual activity: Not on file   Other Topics Concern  . Not on file   Social History Narrative  . No narrative on file    Family History  Problem Relation Age of Onset  . Kidney disease Mother   . Diabetes Mother   . Dementia Mother   . Hypertension Mother     ROS- All systems are reviewed and negative except as per the HPI above  Physical Exam: Vitals:   04/06/17 1011  BP: 118/72  Pulse: (!) 123  Weight: 204 lb (92.5 kg)  Height: 5\' 2"  (1.575 m)    GEN- The patient is well appearing, alert and oriented x 3 today.   Head- normocephalic, atraumatic Eyes-  Sclera clear, conjunctiva pink Ears- hearing intact Oropharynx- clear Neck- supple, no JVP, soft bruits, rt louder than left,? Possible radiation of systolic murmur Lymph- no cervical lymphadenopathy Lungs- Clear to ausculation bilaterally, normal work of breathing Heart- Irregular rate and rhythm, 2/6 systolic murmur rubs or gallops, PMI not laterally displaced GI- soft, NT, ND, + BS Extremities- no clubbing, cyanosis, or edema MS- no significant deformity or atrophy Skin- no rash or lesion Psych- euthymic mood, full affect Neuro- strength and sensation are intact  EKG- afib at 123 bpm, RBBB, qrs int 138 ms, qtc 512 ms. Epic records reviewed Echo-Study Conclusions 08/31/16-  Study Conclusions  - Left ventricle: The cavity size was normal. Wall thickness was   normal. Systolic function was vigorous. The estimated ejection   fraction was in the range of 65% to 70%. Wall motion was normal;   there were no regional wall motion abnormalities. The study is   not technically sufficient to allow evaluation of LV diastolic   function. - Aortic valve: Trileaflet; moderately thickened, severely   calcified leaflets. Valve mobility was  restricted. There was   moderate stenosis. There was trivial regurgitation. Peak velocity   (S): 299 cm/s. Mean gradient (S): 17 mm Hg. - Mitral valve: There was mild regurgitation. - Left atrium: The atrium was mildly dilated. Volume/bsa, S: 37.2   ml/m^2. - Right atrium: The atrium was mildly dilated. - Tricuspid valve: There was moderate regurgitation. - Pulmonary arteries: Systolic pressure was mildly increased. PA   peak pressure: 35 mm Hg (S).  Impressions:  - Compared to the prior study, there has been no significant   interval change.   Assessment and Plan: 1 Persistent afib x one year  Pt did feel better when cardioverted months ago but with ERAF She has been resistant to AAD's She wanted to put off sotalol until early fall due to keeping grandchildren for the summer Has become more short of breath with exertion and fatigued Discussed antiarrythmic therapy with pt, flecainide not an option due to RBBB at baseline, discussed  amiodarone, multaq/ Tikosyn,sotalol, QTC length may be a concern. RBBB contributing to overall length. Pt has weaned off prozac. I feel that multaq may not have the strength to keep pt in rhythm. From a price standpoint, sotalol is the only drug that pt may be able to afford.  Pt did check on price of multaq/tikosyn and thought they were price prohibitive .  Continue metoprolol succinate 25 mg daily  Bmet/mag today, mag low at 1.6. She will take 200 mg magnesium while she is waiting for bed placement for later today INR's therapeutic x 4 weeks, today at 2.36, 8/28- 3.4, 8/23-3.1, 8/16 2.9  2. HTN Stable  3. Chadsvasc score of at least 4 Continue warfarin  4.Moderate aortic stenosis Per Dr. Allyson SabalBerry, scheduled to be repeated 2/19  12/26/15- I discussed pt with Dr. Johney FrameAllred. He feels that the RBBB is contributing to length of qtc,  , that we could go ahead with plans for sotalol. . She shared with me that she is scared of drugs 2/2 her son dying of  anaphylaxis from initiation of lisinopril.  Will admit later today when bed available  Lupita LeashDonna C. Matthew Folksarroll, ANP-C [facility] [address] [place], [place] [address] [phone]

## 2017-04-06 NOTE — Progress Notes (Signed)
ANTICOAGULATION CONSULT NOTE - Initial Consult  Pharmacy Consult for Warfarin Indication: atrial fibrillation  Allergies  Allergen Reactions  . Dilantin [Phenytoin] Hives and Other (See Comments)    Whelps all over for two weeks  . Statins Other (See Comments)    High dose statin - dizziness    Patient Measurements: Height: 5\' 2"  (157.5 cm) Weight: 204 lb (92.5 kg) IBW/kg (Calculated) : 50.1  Vital Signs: Temp: 98 F (36.7 C) (09/11 1756) Temp Source: Oral (09/11 1756) BP: 134/77 (09/11 1756) Pulse Rate: 92 (09/11 1756)  Labs:  Recent Labs  04/06/17 1031  LABPROT 25.6*  INR 2.36  CREATININE 1.12*    Estimated Creatinine Clearance: 48.8 mL/min (A) (by C-G formula based on SCr of 1.12 mg/dL (H)).   Medical History: Past Medical History:  Diagnosis Date  . Atrial fibrillation (HCC)   . Diabetes (HCC)   . Hyperlipidemia    statin intolerant  . Hypertension   . Moderate aortic stenosis   . Right bundle branch block     Assessment: 71 year old female to continue warfarin for Afib INR on admission therapeutic at 2.36 Dose prior to admission = 7.5 mg for one day then 10 mg daily for 4 days, on 10 mg dose for next 3 days in cycle  Goal of Therapy:  INR 2-3 Monitor platelets by anticoagulation protocol: Yes   Plan:  Warfarin 10 mg po x 1 dose tonight Daily INR  Thank you Okey RegalLisa Acy Orsak, PharmD [phone]  04/06/2017,6:38 PM

## 2017-04-06 NOTE — Plan of Care (Signed)
Problem: Education: Goal: Knowledge of [facility] Education information/materials will improve Outcome: Progressing Pt given education on plan of care at present time

## 2017-04-07 LAB — BASIC METABOLIC PANEL
Anion gap: 7 (ref 5–15)
BUN: 18 mg/dL (ref 6–20)
CALCIUM: 9.5 mg/dL (ref 8.9–10.3)
CO2: 31 mmol/L (ref 22–32)
CREATININE: 0.95 mg/dL (ref 0.44–1.00)
Chloride: 103 mmol/L (ref 101–111)
GFR calc non Af Amer: 59 mL/min — ABNORMAL LOW (ref >60)
GLUCOSE: 135 mg/dL — AB (ref 65–99)
Potassium: 4.6 mmol/L (ref 3.5–5.1)
Sodium: 141 mmol/L (ref 135–145)

## 2017-04-07 LAB — PROTIME-INR
INR: 2.48
Prothrombin Time: 26.6 seconds — ABNORMAL HIGH (ref 11.4–15.2)

## 2017-04-07 LAB — CBC
HCT: 39.9 % (ref 36.0–46.0)
Hemoglobin: 13.6 g/dL (ref 12.0–15.0)
MCH: 31.3 pg (ref 26.0–34.0)
MCHC: 34.1 g/dL (ref 30.0–36.0)
MCV: 91.9 fL (ref 78.0–100.0)
PLATELETS: 237 10*3/uL (ref 150–400)
RBC: 4.34 MIL/uL (ref 3.87–5.11)
RDW: 12.7 % (ref 11.5–15.5)
WBC: 9.2 10*3/uL (ref 4.0–10.5)

## 2017-04-07 LAB — MAGNESIUM: MAGNESIUM: 1.8 mg/dL (ref 1.7–2.4)

## 2017-04-07 MED ORDER — PHENOBARBITAL 97.2 MG PO TABS
97.2000 mg | ORAL_TABLET | Freq: Every day | ORAL | Status: DC
  Administered 2017-04-07: 97.2 mg via ORAL
  Filled 2017-04-07: qty 1

## 2017-04-07 MED ORDER — ACETAMINOPHEN 325 MG PO TABS
650.0000 mg | ORAL_TABLET | Freq: Four times a day (QID) | ORAL | Status: DC | PRN
  Administered 2017-04-07: 650 mg via ORAL
  Filled 2017-04-07: qty 2

## 2017-04-07 MED ORDER — METOPROLOL SUCCINATE ER 25 MG PO TB24: 25.0000 mg | ORAL_TABLET | Freq: Every day | ORAL | Status: DC

## 2017-04-07 MED ORDER — ALBUTEROL SULFATE (2.5 MG/3ML) 0.083% IN NEBU
2.5000 mg | INHALATION_SOLUTION | Freq: Four times a day (QID) | RESPIRATORY_TRACT | Status: DC | PRN
Start: 2017-04-07 — End: 2017-04-08

## 2017-04-07 MED ORDER — WARFARIN SODIUM 10 MG PO TABS
10.0000 mg | ORAL_TABLET | Freq: Once | ORAL | Status: AC
  Administered 2017-04-07: 10 mg via ORAL
  Filled 2017-04-07: qty 1

## 2017-04-07 NOTE — Plan of Care (Signed)
Problem: Education: Goal: Knowledge of [facility] Education information/materials will improve Outcome: Progressing Patient aware of plan of care.     

## 2017-04-07 NOTE — Progress Notes (Signed)
ANTICOAGULATION CONSULT NOTE  Pharmacy Consult for Warfarin Indication: atrial fibrillation  Allergies  Allergen Reactions  . Dilantin [Phenytoin] Hives and Other (See Comments)    Whelps all over for two weeks  . Statins Other (See Comments)    High dose statin - dizziness    Patient Measurements: Height: 5\' 2"  (157.5 cm) Weight: 203 lb 9.6 oz (92.4 kg) IBW/kg (Calculated) : 50.1  Vital Signs: Temp: 97.7 F (36.5 C) (09/12 0500) Temp Source: Oral (09/12 0500) BP: 105/62 (09/12 0500) Pulse Rate: 82 (09/12 0500)  Labs:  Recent Labs  04/06/17 1031 04/07/17 0357  HGB  --  13.6  HCT  --  39.9  PLT  --  237  LABPROT 25.6* 26.6*  INR 2.36 2.48  CREATININE 1.12* 0.95    Estimated Creatinine Clearance: 57.4 mL/min (by C-G formula based on SCr of 0.95 mg/dL).   Medical History: Past Medical History:  Diagnosis Date  . Arthritis    "hands" (04/06/2017)  . Asthma   . Atrial fibrillation (HCC)   . Atrial fibrillation with RVR (HCC)   . Chronic back pain    "from the DDD"  . DDD (degenerative disc disease), cervical   . DDD (degenerative disc disease), lumbar   . DDD (degenerative disc disease), thoracic    "not dx'd yet" (04/06/2017)  . Headache   . Heart murmur   . Hyperlipidemia    statin intolerant  . Hypertension   . Migraine    "thru the 1990s; probably stopped after my periods stopped/hysterectomy"  . Moderate aortic stenosis   . Right bundle branch block   . Seizures (HCC)    "when I was younger; none since the 541980s" (04/06/2017)  . Type II diabetes mellitus [facility](HCC)     Assessment: 71 year old female here for sotalol initation to continue warfarin for Afib INR = 2.48 and at goal  Dose prior to admission = 7.5 mg for one day then 10 mg daily for 4 days, on 10 mg dose for next 3 days in cycle  Goal of Therapy:  INR 2-3 Monitor platelets by anticoagulation protocol: Yes   Plan:  Warfarin 10 mg po x 1 dose tonight (day 3 of 10mg  dosing) Daily  INR  Harland GermanAndrew Franciszek Platten, Pharm D 04/07/2017 9:01 AM

## 2017-04-07 NOTE — Progress Notes (Signed)
Progress Note  Patient Name: Julia Rose Date of Encounter: 04/07/2017  Primary Cardiologist: Dr. Allyson Sabal  Subjective   Feels well, no complaints  Inpatient Medications    Scheduled Meds: . hydrochlorothiazide  25 mg Oral Daily  . losartan  50 mg Oral QHS  . metFORMIN  1,500 mg Oral Q breakfast  . pravastatin  10 mg Oral q1800  . sotalol  120 mg Oral Q12H  . warfarin  10 mg Oral ONCE-1800  . Warfarin - Pharmacist Dosing Inpatient   Does not apply q1800   Continuous Infusions:  PRN Meds:    Vital Signs    Vitals:   04/06/17 1756 04/06/17 2051 04/07/17 0500  BP: 134/77 (!) 135/57 105/62  Pulse: 92 81 82  Resp:  18 18  Temp: 98 F (36.7 C) 98.1 F (36.7 C) 97.7 F (36.5 C)  TempSrc: Oral Oral Oral  SpO2: 97% 97% 94%  Weight: 204 lb (92.5 kg)  203 lb 9.6 oz (92.4 kg)  Height:  (1.575 m)      Intake/Output Summary (Last 24 hours) at 04/07/17 1004 Last data filed at 04/07/17 0700  Gross per 24 hour  Intake              240 ml  Output             1425 ml  Net            -1185 ml   Filed Weights   04/06/17 1756 04/07/17 0500  Weight: 204 lb (92.5 kg) 203 lb 9.6 oz (92.4 kg)    Telemetry    AFib 60's-80's this AM - Personally Reviewed  ECG    AFib, RBBB, QTc looks OK to continue - Personally Reviewed  Physical Exam   GEN: No acute distress.   Neck: No JVD Cardiac: IRRR, no murmurs, rubs, or gallops.  Respiratory: Clear to auscultation bilaterally. GI: Soft, nontender, non-distended  MS: No edema; No deformity. Neuro:  Nonfocal  Psych: Normal affect   Labs    Chemistry Recent Labs Lab 04/06/17 1031 04/07/17 0357  NA 139 141  K 4.2 4.6  CL 101 103  CO2 23 31  GLUCOSE 150* 135*  BUN 21* 18  CREATININE 1.12* 0.95  CALCIUM 9.7 9.5  GFRNONAA 48* 59*  GFRAA 56* >60  ANIONGAP 15 7     Hematology Recent Labs Lab 04/07/17 0357  WBC 9.2  RBC 4.34  HGB 13.6  HCT 39.9  MCV 91.9  MCH 31.3  MCHC 34.1  RDW 12.7  PLT 237     Cardiac EnzymesNo results for input(s): TROPONINI in the last 168 hours. No results for input(s): TROPIPOC in the last 168 hours.   BNPNo results for input(s): BNP, PROBNP in the last 168 hours.   DDimer No results for input(s): DDIMER in the last 168 hours.   Radiology    No results found.  Cardiac Studies   No new studies  Patient Profile     71 y.o. female with PMHx of HTN, DM, Persistent AFib here for sotalol initiation  Assessment & Plan    1. Persistent AFib, sotalol initiation     CHA2Ds2Vasc is at least 5, on warfarin, managed in-pt with pharmacy     INR 2.48     K+ 4.6     Mag 1.8     Creat 0.95     QTc stable  2. HTN     No changes today  3. DM  Continue home regime    For questions or updates, please contact [facility] Please consult [email or web address] for contact info under Cardiology/STEMI.      Signed, Sheilah PigeonRenee Lynn Ursuy, PA-C  04/07/2017, 10:04 AM     I have seen, examined the patient, and reviewed the above assessment and plan.  Ambulatory.  Well appearing.   Changes to above are made where necessary.  Continue sotalol.  Plan cardioversion tomorrow if still in AF.  Co Sign: Hillis RangeJames Salimata Christenson, MD 04/07/2017 11:14 AM

## 2017-04-08 LAB — BASIC METABOLIC PANEL
Anion gap: 9 (ref 5–15)
BUN: 22 mg/dL — AB (ref 6–20)
CALCIUM: 9.1 mg/dL (ref 8.9–10.3)
CO2: 27 mmol/L (ref 22–32)
CREATININE: 0.78 mg/dL (ref 0.44–1.00)
Chloride: 102 mmol/L (ref 101–111)
GFR calc Af Amer: >60 mL/min (ref >60)
GLUCOSE: 128 mg/dL — AB (ref 65–99)
Potassium: 3.7 mmol/L (ref 3.5–5.1)
Sodium: 138 mmol/L (ref 135–145)

## 2017-04-08 LAB — PROTIME-INR
INR: 2.61
PROTHROMBIN TIME: 27.7 s — AB (ref 11.4–15.2)

## 2017-04-08 LAB — MAGNESIUM: Magnesium: 1.6 mg/dL — ABNORMAL LOW (ref 1.7–2.4)

## 2017-04-08 MED ORDER — POTASSIUM CHLORIDE CRYS ER 20 MEQ PO TBCR
30.0000 meq | EXTENDED_RELEASE_TABLET | Freq: Once | ORAL | Status: AC
  Administered 2017-04-08: 30 meq via ORAL
  Filled 2017-04-08: qty 1

## 2017-04-08 MED ORDER — MAGNESIUM SULFATE 2 GM/50ML IV SOLN
2.0000 g | Freq: Once | INTRAVENOUS | Status: AC
  Administered 2017-04-08: 2 g via INTRAVENOUS
  Filled 2017-04-08: qty 50

## 2017-04-08 MED ORDER — WARFARIN SODIUM 10 MG PO TABS: 10.0000 mg | ORAL_TABLET | Freq: Once | ORAL | Status: DC

## 2017-04-08 MED ORDER — SOTALOL HCL 120 MG PO TABS: 120.0000 mg | ORAL_TABLET | Freq: Two times a day (BID) | ORAL | 6 refills | Status: DC

## 2017-04-08 MED ORDER — MAGNESIUM OXIDE 400 (241.3 MG) MG PO TABS: 200.0000 mg | ORAL_TABLET | Freq: Every day | ORAL | 6 refills | Status: DC

## 2017-04-08 MED ORDER — MAGNESIUM OXIDE 400 (241.3 MG) MG PO TABS
200.0000 mg | ORAL_TABLET | Freq: Every day | ORAL | Status: DC
  Administered 2017-04-08: 200 mg via ORAL
  Filled 2017-04-08: qty 1

## 2017-04-08 NOTE — Discharge Summary (Signed)
ELECTROPHYSIOLOGY PROCEDURE DISCHARGE SUMMARY    Patient ID: Julia Rose,  MRN: [id number], DOB/AGE: 02-07-1946 71 y.o.  Admit date: 04/06/2017 Discharge date: 04/08/2017  Primary Care Physician: Kaleen MaskElkins, Wilson Oliver, MD  Primary Cardiologist: Dr. Allyson SabalBerry  Primary Discharge Diagnosis:  1.  Persistent atrial fibrillation status post Sotalol loading this admission      CHA2DS2Vasc is at least 4, on warfarin, monitored and managed with her PMD  Secondary Discharge Diagnosis:  1. HTN 2. DM 3. VHD     Mod AS  Allergies  Allergen Reactions  . Dilantin [Phenytoin] Hives and Other (See Comments)    Whelps all over for two weeks  . Statins Other (See Comments)    High dose statin - dizziness     Procedures This Admission:  1.  Sotalol loading   Brief HPI: Julia Rose is a 71 y.o. female with a past medical history as noted above.  They were referred to EP in the outpatient setting for treatment options of atrial fibrillation.  Risks, benefits, and alternatives to Tikosyn were reviewed with the patient who wished to proceed.    Hospital Course:  The patient was admitted and Sotalol was initiated.  Renal function and electrolytes were followed during the hospitalization.  She did require magnesium supplementation and will be rx low dose mag replacement. Her QTc remained stable.  The patient converted to SR with drug and did not require DCCV.  She was monitored until discharge on telemetry which demonstrated AFib >> SR.  On the day of discharge, the patient was examined by Dr Johney FrameAllred who considered her stable for discharge to home.  Follow-up has been arranged with the AFib clinic in 1 week and with Dr Johney FrameAllred in 4 weeks.   Physical Exam: Vitals:   04/07/17 0500 04/07/17 1500 04/07/17 2138 04/08/17 0621  BP: 105/62 (!) 108/57 (!) 105/57 121/62  Pulse: 82  65 63  Resp: 18 20    Temp: 97.7 F (36.5 C) 97.6 F (36.4 C) 98.1 F (36.7 C) 98.1 F (36.7 C)  TempSrc: Oral  Axillary Oral Oral  SpO2: 94% 100% 97% 97%  Weight: 203 lb 9.6 oz (92.4 kg)   201 lb 6.4 oz (91.4 kg)  Height:        GEN- The patient is well appearing, alert and oriented x 3 today.   HEENT: normocephalic, atraumatic; sclera clear, conjunctiva pink; hearing intact; oropharynx clear; neck supple, no JVP Lymph- no cervical lymphadenopathy Lungs- CTA b/l, normal work of breathing.  No wheezes, rales, rhonchi Heart- RRR, no murmurs, rubs or gallops, PMI not laterally displaced GI- soft, non-tender, non-distended Extremities- no clubbing, cyanosis, or edema MS- no significant deformity or atrophy Skin- warm and dry, no rash or lesion Psych- euthymic mood, full affect Neuro- strength and sensation are intact   Labs:   Lab Results  Component Value Date   WBC 9.2 04/07/2017   HGB 13.6 04/07/2017   HCT 39.9 04/07/2017   MCV 91.9 04/07/2017   PLT 237 04/07/2017     Recent Labs Lab 04/08/17 0707  NA 138  K 3.7  CL 102  CO2 27  BUN 22*  CREATININE 0.78  CALCIUM 9.1  GLUCOSE 128*     Discharge Medications:  Allergies as of 04/08/2017      Reactions   Dilantin [phenytoin] Hives, Other (See Comments)   Whelps all over for two weeks   Statins Other (See Comments)   High dose statin - dizziness  Medication List    STOP taking these medications   metoprolol succinate 25 MG 24 hr tablet Commonly known as:  TOPROL XL     TAKE these medications   albuterol 108 (90 Base) MCG/ACT inhaler Commonly known as:  PROVENTIL HFA;VENTOLIN HFA Inhale 2 puffs into the lungs every 6 (six) hours as needed for wheezing or shortness of breath.   ANTI-DIARRHEAL PO Take 1 tablet by mouth as needed (for diarrhea).   EXCEDRIN MIGRAINE PO Take 1-2 tablets by mouth daily as needed (for headache).   GAS RELIEF 180 MG Caps Generic drug:  Simethicone Take 180 mg by mouth as needed (for gas).   hydrochlorothiazide 25 MG tablet Commonly known as:  HYDRODIURIL Take 25 mg by mouth  daily.   ibuprofen 200 MG tablet Commonly known as:  ADVIL,MOTRIN Take 400-800 mg by mouth every 6 (six) hours as needed for headache or moderate pain.   losartan 100 MG tablet Commonly known as:  COZAAR Take 50 mg by mouth at bedtime.   lovastatin 20 MG tablet Commonly known as:  MEVACOR Take 20 mg by mouth daily at 6 PM.   magnesium oxide 400 (241.3 Mg) MG tablet Commonly known as:  MAG-OX Take 0.5 tablets (200 mg total) by mouth daily.   metFORMIN 1000 MG tablet Commonly known as:  GLUCOPHAGE Take 500-1,000 mg by mouth See admin instructions. Take 1000 mg by mouth in the morning and take 500 mg by mouth in the evening   nystatin-triamcinolone cream Commonly known as:  MYCOLOG II Apply 1 application topically 2 (two) times daily as needed (for yeast infection).   PHENobarbital 97.2 MG tablet Commonly known as:  LUMINAL Take 97.2 mg by mouth at bedtime.   sotalol 120 MG tablet Commonly known as:  BETAPACE Take 1 tablet (120 mg total) by mouth every 12 (twelve) hours.   warfarin 5 MG tablet Commonly known as:  COUMADIN Take 1 tablet (5 mg total) by mouth daily. What changed:  how much to take  when to take this  additional instructions Notes to patient:  Continue your current dosing schedule unchanged            Discharge Care Instructions        Start     Ordered   04/09/17 0000  magnesium oxide (MAG-OX) 400 (241.3 Mg) MG tablet  Daily    Question:  Supervising Provider  Answer:  Hillis Range   04/08/17 1301   04/08/17 0000  sotalol (BETAPACE) 120 MG tablet  Every 12 hours    Question:  Supervising Provider  Answer:  Hillis Range   04/08/17 1301   04/08/17 0000  Increase activity slowly     04/08/17 1301   04/08/17 0000  Diet - low sodium heart healthy     04/08/17 1301      Disposition:  Home Discharge Instructions    Diet - low sodium heart healthy    Complete by:  As directed    Increase activity slowly    Complete by:  As directed        Follow-up Information    [facility] Follow up on 04/15/2017.   Specialty:  Cardiology Why:  2:30PM Contact information: [address] [id number] [facility] [address] [place] [address] [phone]       Hillis Range, MD Follow up on 05/10/2017.   Specialty:  Cardiology Why:  9:15AM Contact information: [address] [place] [address] [phone]  Kaleen Mask, MD Follow up.   Specialty:  Family Medicine Why:  Please call and schedule an coumadin visit/INR check within the next 7-10 days Contact information: [address] [place] [address] [phone]           Duration of Discharge Encounter: Greater than 30 minutes including physician time.  Signed, Francis Dowse, PA-C 04/08/2017 1:02 PM  I have seen, examined the patient, and reviewed the above assessment and plan.  Changes to above are made where necessary.  On exam, RRR.  DC to home today.  Close follow-up in AF clinic.  Co Sign: Hillis Range, MD 04/08/2017 1:16 PM   '

## 2017-04-08 NOTE — Progress Notes (Signed)
ANTICOAGULATION CONSULT NOTE  Pharmacy Consult for Warfarin Indication: atrial fibrillation  Allergies  Allergen Reactions  . Dilantin [Phenytoin] Hives and Other (See Comments)    Whelps all over for two weeks  . Statins Other (See Comments)    High dose statin - dizziness    Patient Measurements: Height: 5\' 2"  (157.5 cm) Weight: 201 lb 6.4 oz (91.4 kg) IBW/kg (Calculated) : 50.1  Vital Signs: Temp: 98.1 F (36.7 C) (09/13 0621) Temp Source: Oral (09/13 0621) BP: 121/62 (09/13 0621) Pulse Rate: 63 (09/13 0621)  Labs:  Recent Labs  04/06/17 1031 04/07/17 0357 04/08/17 0202 04/08/17 0707  HGB  --  13.6  --   --   HCT  --  39.9  --   --   PLT  --  237  --   --   LABPROT 25.6* 26.6* 27.7*  --   INR 2.36 2.48 2.61  --   CREATININE 1.12* 0.95  --  0.78    Estimated Creatinine Clearance: 67.8 mL/min (by C-G formula based on SCr of 0.78 mg/dL).   Medical History: Past Medical History:  Diagnosis Date  . Arthritis    "hands" (04/06/2017)  . Asthma   . Atrial fibrillation (HCC)   . Atrial fibrillation with RVR (HCC)   . Chronic back pain    "from the DDD"  . DDD (degenerative disc disease), cervical   . DDD (degenerative disc disease), lumbar   . DDD (degenerative disc disease), thoracic    "not dx'd yet" (04/06/2017)  . Headache   . Heart murmur   . Hyperlipidemia    statin intolerant  . Hypertension   . Migraine    "thru the 1990s; probably stopped after my periods stopped/hysterectomy"  . Moderate aortic stenosis   . Right bundle branch block   . Seizures (HCC)    "when I was younger; none since the 201980s" (04/06/2017)  . Type II diabetes mellitus [facility](HCC)     Assessment: 71 year old female here for sotalol initation to continue warfarin for Afib INR = 2.61 and at goal (trend up noted)  Dose prior to admission = 7.5 mg for one day then 10 mg daily for 4 days, on 10 mg dose for next 3 days in cycle  Goal of Therapy:  INR 2-3 Monitor platelets by  anticoagulation protocol: Yes   Plan:  Warfarin 10 mg po x 1 dose tonight (day 4 of 10mg  dosing) Daily INR  Harland GermanAndrew Dexter Sauser, Pharm D 04/08/2017 10:48 AM

## 2017-04-15 ENCOUNTER — Inpatient Hospital Stay (HOSPITAL_COMMUNITY): Admit: 2017-04-15 | Payer: Medicare Other | Admitting: Nurse Practitioner

## 2017-04-15 ENCOUNTER — Encounter (HOSPITAL_COMMUNITY): Payer: Self-pay | Admitting: Nurse Practitioner

## 2017-04-15 ENCOUNTER — Ambulatory Visit (HOSPITAL_COMMUNITY)
Admission: RE | Admit: 2017-04-15 | Discharge: 2017-04-15 | Disposition: A | Discharge status: Home / Self Care | Payer: Medicare Other | Source: Ambulatory Visit | Attending: Nurse Practitioner | Admitting: Nurse Practitioner

## 2017-04-15 VITALS — BP 112/72 | HR 58 | Ht 62.0 in | Wt 205.6 lb

## 2017-04-15 DIAGNOSIS — M1990 Unspecified osteoarthritis, unspecified site: Secondary | ICD-10-CM | POA: Insufficient documentation

## 2017-04-15 DIAGNOSIS — J45909 Unspecified asthma, uncomplicated: Secondary | ICD-10-CM | POA: Diagnosis not present

## 2017-04-15 DIAGNOSIS — Z7984 Long term (current) use of oral hypoglycemic drugs: Secondary | ICD-10-CM | POA: Diagnosis not present

## 2017-04-15 DIAGNOSIS — I481 Persistent atrial fibrillation: Secondary | ICD-10-CM | POA: Diagnosis not present

## 2017-04-15 DIAGNOSIS — Z79899 Other long term (current) drug therapy: Secondary | ICD-10-CM | POA: Insufficient documentation

## 2017-04-15 DIAGNOSIS — G8929 Other chronic pain: Secondary | ICD-10-CM | POA: Insufficient documentation

## 2017-04-15 DIAGNOSIS — E119 Type 2 diabetes mellitus without complications: Secondary | ICD-10-CM | POA: Diagnosis not present

## 2017-04-15 DIAGNOSIS — I4510 Unspecified right bundle-branch block: Secondary | ICD-10-CM | POA: Diagnosis not present

## 2017-04-15 DIAGNOSIS — I350 Nonrheumatic aortic (valve) stenosis: Secondary | ICD-10-CM | POA: Diagnosis not present

## 2017-04-15 DIAGNOSIS — Z888 Allergy status to other drugs, medicaments and biological substances status: Secondary | ICD-10-CM | POA: Diagnosis not present

## 2017-04-15 DIAGNOSIS — I4819 Other persistent atrial fibrillation: Secondary | ICD-10-CM

## 2017-04-15 DIAGNOSIS — I10 Essential (primary) hypertension: Secondary | ICD-10-CM | POA: Diagnosis not present

## 2017-04-15 DIAGNOSIS — M549 Dorsalgia, unspecified: Secondary | ICD-10-CM | POA: Insufficient documentation

## 2017-04-15 DIAGNOSIS — Z833 Family history of diabetes mellitus: Secondary | ICD-10-CM | POA: Insufficient documentation

## 2017-04-15 DIAGNOSIS — Z841 Family history of disorders of kidney and ureter: Secondary | ICD-10-CM | POA: Diagnosis not present

## 2017-04-15 DIAGNOSIS — Z8249 Family history of ischemic heart disease and other diseases of the circulatory system: Secondary | ICD-10-CM | POA: Diagnosis not present

## 2017-04-15 DIAGNOSIS — E785 Hyperlipidemia, unspecified: Secondary | ICD-10-CM | POA: Diagnosis not present

## 2017-04-15 DIAGNOSIS — Z7901 Long term (current) use of anticoagulants: Secondary | ICD-10-CM | POA: Diagnosis not present

## 2017-04-15 DIAGNOSIS — I480 Paroxysmal atrial fibrillation: Secondary | ICD-10-CM | POA: Diagnosis present

## 2017-04-15 LAB — BASIC METABOLIC PANEL
Anion gap: 8 (ref 5–15)
BUN: 14 mg/dL (ref 6–20)
CHLORIDE: 101 mmol/L (ref 101–111)
CO2: 30 mmol/L (ref 22–32)
CREATININE: 0.88 mg/dL (ref 0.44–1.00)
Calcium: 9.1 mg/dL (ref 8.9–10.3)
GFR calc Af Amer: >60 mL/min (ref >60)
GFR calc non Af Amer: >60 mL/min (ref >60)
GLUCOSE: 111 mg/dL — AB (ref 65–99)
POTASSIUM: 4 mmol/L (ref 3.5–5.1)
Sodium: 139 mmol/L (ref 135–145)

## 2017-04-15 LAB — MAGNESIUM: MAGNESIUM: 1.8 mg/dL (ref 1.7–2.4)

## 2017-04-15 NOTE — Progress Notes (Signed)
Patient ID: Julia Rose, female   DOB: 30-Jul-1945, 71 y.o.   MRN: [id number]     Primary Care Physician: Kaleen Mask, MD Referring Physician: Dr. Matt Holmes Julia Rose is a 71 y.o. 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 She was referred by Dr. Allyson Sabal for further evaluation 07/12/15.  The afib was paroxysmal at that time. She had been started on edoxaban 30 mg daily for a chadsvasc score of 4. She was aware of fatigue, shortness of breath and chest heaviness when in afib. 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   On f/u in afib clinic, 08/09/15, she reported one sustained afib episode for several hours, a few smaller ones.  Overall, not bothering her enough to consider an antiarrythmic. Daily BB was added. She is very conservative toward medicine. Continued endoxaban using this due to being most compatible with phenobarbital, but eventually converted to coumadin for cost issues.  Returned 10/07/15, was in afib but unaware.Rate controlled. 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 So not interested in a change today.  Returned to afib clinic 09/2016,  after seeing Dr. Allyson Sabal and pt feeling she has been in persistent afib since  fall. For the most part she is minimally symptomatic. She walks with her daughter when the weather is nice 30 mins to an hour and can do so with minimal symptoms. She had a repeat ECHO with normal EF and moderate aortic stenosis which she was told was  stable. She was asked to be seen here to discuss antiarrythmic therapy. 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 Continues warfarin for a CHA2DS2VASc score of at least 3. She deferred any change in therapy due to up coming trip.   Returned to afib clinic 10/28/16, and wishes to further discuss restoring SR. When she went on her trip, she noticed how short of breath with activity she had become since the trip last year. People in her  group were always waiting for her to catch up with them.She reports that when she makes the bed, she has to rest half way in between. Financial concerns would definitely play into what antiarrythmic she would be able to afford. After discussion of available antiarrythmic's, she was not ready to start AAD but was interested in pursuing cardioversion alone to see if she could return to SR and if her symptoms would improve in SR. She also has moderate aortic stenosis that may be playing a part in he symptoms, but by echo Dr. Allyson Sabal told her this is stable.. She is on warfarin and has had 3 therapeutic warfarin levels checked  with PCP, 5/2 - 2.4, 5/10 - 2.1, 5/16 - 2.3 and INR pending this am with pre procedure labs. Pt aware of risk vrs benefit of procedure and wants to proceed.  F/u afib clinic 5/30 f/u cardioversion. The cardioversion was successful but unfortunately she did return to afib after 3 days. It was long enough for her to notice that she had more energy and less exertional dyspnea. This has made her more interested in pursing SR using an antiarrythmic which she was against before.  F/u in afib clinic, pt has been interested in hospitalization for sotalol but kept her grandkids for the summer and wanted to wait until fall. Drugs were screened, it was recommended that she wean off Prozac, which she has done without any untoward effects.She has had 3  weekly therapeutic INR's with PCP and INR this am also therapeutic. She has noted that she has grown more dyspneic with exertion this past summer. EKG shows afib with RVR this am with 123 bpm, pt has been rate controlled in past.Qtc is 512 but with RBBB and RVR. Qtc is SR has ranged from 414 to 474 but with RBBB contributing.  F/u 9/20 from hospitalization for sotalol, fortunately, pt did convert with drug and did not require cardioversion. She does have less shortness of breath in SR and more energy. She has done some outdoor walking and deep cleaning at  her house which would not have been possible in the past year in afib. She is happy re results.  Today, she  with minimal  symptoms of palpitations, shortness of breath, no chest pain, orthopnea, PND, lower extremity edema, dizziness, presyncope, syncope, or neurologic sequela. The patient is tolerating medications without difficulties and is otherwise without complaint today.   Past Medical History:  Diagnosis Date  . Arthritis    "hands" (04/06/2017)  . Asthma   . Atrial fibrillation (HCC)   . Atrial fibrillation with RVR (HCC)   . Chronic back pain    "from the DDD"  . DDD (degenerative disc disease), cervical   . DDD (degenerative disc disease), lumbar   . DDD (degenerative disc disease), thoracic    "not dx'd yet" (04/06/2017)  . Headache   . Heart murmur   . Hyperlipidemia    statin intolerant  . Hypertension   . Migraine    "thru the 1990s; probably stopped after my periods stopped/hysterectomy"  . Moderate aortic stenosis   . Right bundle branch block   . Seizures (HCC)    "when I was younger; none since the 74s" (04/06/2017)  . Type II diabetes mellitus (HCC)    Past Surgical History:  Procedure Laterality Date  . CARDIOVERSION N/A 12/17/2016   Procedure: CARDIOVERSION;  Surgeon: Elease Hashimoto, Deloris Ping, MD;  Location: [facility] ENDOSCOPY;  Service: Cardiovascular;  Laterality: N/A;  . DIAGNOSTIC LAPAROSCOPY     "paraovarian cyst removed"  . TONSILLECTOMY    . VAGINAL HYSTERECTOMY     "partial"    Current Outpatient Prescriptions  Medication Sig Dispense Refill  . albuterol (PROVENTIL HFA;VENTOLIN HFA) 108 (90 Base) MCG/ACT inhaler Inhale 2 puffs into the lungs every 6 (six) hours as needed for wheezing or shortness of breath.    . Aspirin-Acetaminophen-Caffeine (EXCEDRIN MIGRAINE PO) Take 1-2 tablets by mouth daily as needed (for headache).     . hydrochlorothiazide (HYDRODIURIL) 25 MG tablet Take 25 mg by mouth daily.    Marland Kitchen ibuprofen (ADVIL,MOTRIN) 200 MG tablet Take 400-800  mg by mouth every 6 (six) hours as needed for headache or moderate pain.     . Loperamide HCl (ANTI-DIARRHEAL PO) Take 1 tablet by mouth as needed (for diarrhea).     . losartan (COZAAR) 100 MG tablet Take 50 mg by mouth at bedtime.  0  . lovastatin (MEVACOR) 20 MG tablet Take 20 mg by mouth daily at 6 PM.     . magnesium oxide (MAG-OX) 400 (241.3 Mg) MG tablet Take 0.5 tablets (200 mg total) by mouth daily. 30 tablet 6  . metFORMIN (GLUCOPHAGE) 1000 MG tablet Take 500-1,000 mg by mouth See admin instructions. Take 1000 mg by mouth in the morning and take 500 mg by mouth in the evening    . nystatin-triamcinolone (MYCOLOG II) cream Apply 1 application topically 2 (two) times daily as needed (for yeast infection).    Marland Kitchen  PHENobarbital (LUMINAL) 97.2 MG tablet Take 97.2 mg by mouth at bedtime.   4  . Simethicone (GAS RELIEF) 180 MG CAPS Take 180 mg by mouth as needed (for gas).     . sotalol (BETAPACE) 120 MG tablet Take 1 tablet (120 mg total) by mouth every 12 (twelve) hours. 60 tablet 6  . warfarin (COUMADIN) 5 MG tablet Take 1 tablet (5 mg total) by mouth daily. (Patient taking differently: Take 7.5-10 mg by mouth See admin instructions. Take 7.5 mg by mouth daily for one day then take 10 mg by mouth daily for 4 days, then start cycle over) 30 tablet 0   No current facility-administered medications for this encounter.     Allergies  Allergen Reactions  . Dilantin [Phenytoin] Hives and Other (See Comments)    Whelps all over for two weeks  . Statins Other (See Comments)    High dose statin - dizziness    Social History   Social History  . Marital status: Married    Spouse name: N/A  . Number of children: N/A  . Years of education: N/A   Occupational History  . Not on file.   Social History Main Topics  . Smoking status: Never Smoker  . Smokeless tobacco: Never Used  . Alcohol use No  . Drug use: No  . Sexual activity: Not Currently   Other Topics Concern  . Not on file    Social History Narrative  . No narrative on file    Family History  Problem Relation Age of Onset  . Kidney disease Mother   . Diabetes Mother   . Dementia Mother   . Hypertension Mother     ROS- All systems are reviewed and negative except as per the HPI above  Physical Exam: Vitals:   04/15/17 1130  BP: 112/72  Pulse: (!) 58  Weight: 205 lb 9.6 oz (93.3 kg)  Height:  (1.575 m)    GEN- The patient is well appearing, alert and oriented x 3 today.   Head- normocephalic, atraumatic Eyes-  Sclera clear, conjunctiva pink Ears- hearing intact Oropharynx- clear Neck- supple, no JVP, soft bruits, rt louder than left,? Possible radiation of systolic murmur Lymph- no cervical lymphadenopathy Lungs- Clear to ausculation bilaterally, normal work of breathing Heart- regular rate and rhythm, 2/6 systolic murmur rubs or gallops, PMI not laterally displaced GI- soft, NT, ND, + BS Extremities- no clubbing, cyanosis, or edema MS- no significant deformity or atrophy Skin- no rash or lesion Psych- euthymic mood, full affect Neuro- strength and sensation are intact  EKG- Sinus brady at 58 bpm, pr int 160 ms, qrs int 136 ms, qtc 463 ms Epic records reviewed Echo-Study Conclusions 08/31/16-  Study Conclusions  - Left ventricle: The cavity size was normal. Wall thickness was   normal. Systolic function was vigorous. The estimated ejection   fraction was in the range of 65% to 70%. Wall motion was normal;   there were no regional wall motion abnormalities. The study is   not technically sufficient to allow evaluation of LV diastolic   function. - Aortic valve: Trileaflet; moderately thickened, severely   calcified leaflets. Valve mobility was restricted. There was   moderate stenosis. There was trivial regurgitation. Peak velocity   (S): 299 cm/s. Mean gradient (S): 17 mm Hg. - Mitral valve: There was mild regurgitation. - Left atrium: The atrium was mildly dilated.  Volume/bsa, S: 37.2   ml/m^2. - Right atrium: The atrium was mildly dilated. -  Tricuspid valve: There was moderate regurgitation. - Pulmonary arteries: Systolic pressure was mildly increased. PA   peak pressure: 35 mm Hg (S).  Impressions:  - Compared to the prior study, there has been no significant   interval change.   Assessment and Plan: 1 Persistent afib x one year   Recently hospitalized for sotalol loading which was successful She is maintaining SR Bmet/mag today  2. HTN Stable  3. Chadsvasc score of at least 4 Continue warfarin  4.Moderate aortic stenosis Per Dr. Allyson Sabal, scheduled to be repeated 2/19  F/u with Dr. Johney Frame 10/15    Lupita Leash [facility] [address] [place], [place] [address] [phone]

## 2017-05-10 ENCOUNTER — Encounter (INDEPENDENT_AMBULATORY_CARE_PROVIDER_SITE_OTHER): Payer: Self-pay

## 2017-05-10 ENCOUNTER — Ambulatory Visit (INDEPENDENT_AMBULATORY_CARE_PROVIDER_SITE_OTHER): Payer: Medicare Other | Admitting: Internal Medicine

## 2017-05-10 ENCOUNTER — Encounter: Payer: Self-pay | Admitting: Internal Medicine

## 2017-05-10 VITALS — BP 118/78 | HR 59 | Ht 62.0 in | Wt 202.6 lb

## 2017-05-10 DIAGNOSIS — I481 Persistent atrial fibrillation: Secondary | ICD-10-CM | POA: Diagnosis not present

## 2017-05-10 DIAGNOSIS — I4819 Other persistent atrial fibrillation: Secondary | ICD-10-CM

## 2017-05-10 DIAGNOSIS — I350 Nonrheumatic aortic (valve) stenosis: Secondary | ICD-10-CM | POA: Diagnosis not present

## 2017-05-10 DIAGNOSIS — I10 Essential (primary) hypertension: Secondary | ICD-10-CM

## 2017-05-10 NOTE — Patient Instructions (Addendum)
Medication Instructions:  Your physician recommends that you continue on your current medications as directed. Please refer to the Current Medication list given to you today.   Labwork: None ordered   Testing/Procedures: None ordered   Follow-Up: Your physician recommends that you schedule a follow-up appointment in:   3 months with Rudi Coco in Afib Clinic   Any Other Special Instructions Will Be Listed Below (If Applicable).     If you need a refill on your cardiac medications before your next appointment, please call your pharmacy.

## 2017-05-10 NOTE — Progress Notes (Signed)
PCP: Kaleen Mask, MD Primary Cardiologist: Dr Allyson Sabal Primary EP: Dr Johney Frame  Julia Rose is a 71 y.o. female who presents today for routine electrophysiology followup.  Since starting sotalol, the patient reports doing very well.  Her SOB and fatigue have both improved.  Her wheezing is chronic and stable, not worsened by sotalol.  She snores but declines sleep study.  Not making any progress with weight loss.  Today, she denies symptoms of palpitations, chest pain, shortness of breath,  lower extremity edema, dizziness, presyncope, or syncope.  The patient is otherwise without complaint today.   Past Medical History:  Diagnosis Date  . Arthritis    "hands" (04/06/2017)  . Asthma   . Atrial fibrillation (HCC)   . Atrial fibrillation with RVR (HCC)   . Chronic back pain    "from the DDD"  . DDD (degenerative disc disease), cervical   . DDD (degenerative disc disease), lumbar   . DDD (degenerative disc disease), thoracic    "not dx'd yet" (04/06/2017)  . Headache   . Heart murmur   . Hyperlipidemia    statin intolerant  . Hypertension   . Migraine    "thru the 1990s; probably stopped after my periods stopped/hysterectomy"  . Moderate aortic stenosis   . Right bundle branch block   . Seizures (HCC)    "when I was younger; none since the 19s" (04/06/2017)  . Type II diabetes mellitus (HCC)    Past Surgical History:  Procedure Laterality Date  . CARDIOVERSION N/A 12/17/2016   Procedure: CARDIOVERSION;  Surgeon: Elease Hashimoto, Deloris Ping, MD;  Location: [facility] ENDOSCOPY;  Service: Cardiovascular;  Laterality: N/A;  . DIAGNOSTIC LAPAROSCOPY     "paraovarian cyst removed"  . TONSILLECTOMY    . VAGINAL HYSTERECTOMY     "partial"    ROS- all systems are reviewed and negatives except as per HPI above  Current Outpatient Prescriptions  Medication Sig Dispense Refill  . albuterol (PROVENTIL HFA;VENTOLIN HFA) 108 (90 Base) MCG/ACT inhaler Inhale 2 puffs into the lungs every 6 (six)  hours as needed for wheezing or shortness of breath.    . Aspirin-Acetaminophen-Caffeine (EXCEDRIN MIGRAINE PO) Take 1-2 tablets by mouth daily as needed (for headache).     . hydrochlorothiazide (HYDRODIURIL) 25 MG tablet Take 25 mg by mouth daily.    Marland Kitchen ibuprofen (ADVIL,MOTRIN) 200 MG tablet Take 400-800 mg by mouth every 6 (six) hours as needed for headache or moderate pain.     . Loperamide HCl (ANTI-DIARRHEAL PO) Take 1 tablet by mouth as needed (for diarrhea).     . losartan (COZAAR) 100 MG tablet Take 50 mg by mouth at bedtime.  0  . lovastatin (MEVACOR) 20 MG tablet Take 20 mg by mouth daily at 6 PM.     . magnesium oxide (MAG-OX) 400 (241.3 Mg) MG tablet Take 0.5 tablets (200 mg total) by mouth daily. 30 tablet 6  . metFORMIN (GLUCOPHAGE) 1000 MG tablet Take 500-1,000 mg by mouth See admin instructions. Take 1000 mg by mouth in the morning and take 500 mg by mouth in the evening    . nystatin-triamcinolone (MYCOLOG II) cream Apply 1 application topically 2 (two) times daily as needed (for yeast infection).    Marland Kitchen PHENobarbital (LUMINAL) 97.2 MG tablet Take 97.2 mg by mouth at bedtime.   4  . Simethicone (GAS RELIEF) 180 MG CAPS Take 180 mg by mouth as needed (for gas).     . sotalol (BETAPACE) 120 MG tablet Take  1 tablet (120 mg total) by mouth every 12 (twelve) hours. 60 tablet 6  . warfarin (COUMADIN) 5 MG tablet Take 1 tablet (5 mg total) by mouth daily. (Patient taking differently: Take 7.5-10 mg by mouth See admin instructions. Take 7.5 mg by mouth daily for one day then take 10 mg by mouth daily for 4 days, then start cycle over) 30 tablet 0   No current facility-administered medications for this visit.     Physical Exam: Vitals:   05/10/17 0927  BP: 118/78  Pulse: (!) 59  Weight: 202 lb 9.6 oz (91.9 kg)  Height:  (1.575 m)    GEN- The patient is well appearing, alert and oriented x 3 today.   Head- normocephalic, atraumatic Eyes-  Sclera clear, conjunctiva pink Ears-  hearing intact Oropharynx- clear Lungs- Clear to ausculation bilaterally, normal work of breathing Heart- Regular rate and rhythm, no murmurs, rubs or gallops, PMI not laterally displaced GI- soft, NT, ND, + BS Extremities- no clubbing, cyanosis, or edema  EKG tracing ordered today is personally reviewed and shows sinus bradycardia 59 bpm, PR 162 msec, QRS 130 msec, RBBB, QTc 467 msec  Assessment and Plan:  1. Persistent atrial fibrillation Doing well at this time on sotalol 04/15/17 labs are reviewed qtc is stable Continue long term anticoagulation with warfarin  2. HTN Stable No change required today  3. Moderate AS Followed by Dr Allyson Sabal  4. Obesity Body mass index is 37.06 kg/m. Lifestyle modification encouraged She is not committed to lifestyle modification  5. Snoring I have strongly advised sleep study She declines   Follow-up with Lupita Leash every 3 months in the AF clinic I will see when needed Follow-up with Dr Allyson Sabal as scheduled  Hillis Range MD, [facility] 05/10/2017 9:34 AM

## 2017-07-22 ENCOUNTER — Telehealth: Payer: Self-pay | Admitting: Cardiovascular Disease

## 2017-07-22 NOTE — Telephone Encounter (Signed)
Closed Encounter  °

## 2017-08-10 ENCOUNTER — Ambulatory Visit (HOSPITAL_COMMUNITY)
Admission: RE | Admit: 2017-08-10 | Discharge: 2017-08-10 | Disposition: A | Discharge status: Home / Self Care | Payer: Medicare Other | Source: Ambulatory Visit | Attending: Nurse Practitioner | Admitting: Nurse Practitioner

## 2017-08-10 VITALS — BP 132/78 | HR 65 | Ht 62.0 in | Wt 199.0 lb

## 2017-08-10 DIAGNOSIS — M5136 Other intervertebral disc degeneration, lumbar region: Secondary | ICD-10-CM | POA: Diagnosis not present

## 2017-08-10 DIAGNOSIS — I10 Essential (primary) hypertension: Secondary | ICD-10-CM | POA: Insufficient documentation

## 2017-08-10 DIAGNOSIS — I350 Nonrheumatic aortic (valve) stenosis: Secondary | ICD-10-CM | POA: Insufficient documentation

## 2017-08-10 DIAGNOSIS — I481 Persistent atrial fibrillation: Secondary | ICD-10-CM

## 2017-08-10 DIAGNOSIS — I4819 Other persistent atrial fibrillation: Secondary | ICD-10-CM

## 2017-08-10 DIAGNOSIS — Z79899 Other long term (current) drug therapy: Secondary | ICD-10-CM | POA: Insufficient documentation

## 2017-08-10 DIAGNOSIS — I4510 Unspecified right bundle-branch block: Secondary | ICD-10-CM | POA: Insufficient documentation

## 2017-08-10 DIAGNOSIS — Z7901 Long term (current) use of anticoagulants: Secondary | ICD-10-CM | POA: Diagnosis not present

## 2017-08-10 DIAGNOSIS — Z7984 Long term (current) use of oral hypoglycemic drugs: Secondary | ICD-10-CM | POA: Diagnosis not present

## 2017-08-10 DIAGNOSIS — Z7982 Long term (current) use of aspirin: Secondary | ICD-10-CM | POA: Insufficient documentation

## 2017-08-10 DIAGNOSIS — G8929 Other chronic pain: Secondary | ICD-10-CM | POA: Insufficient documentation

## 2017-08-10 DIAGNOSIS — E785 Hyperlipidemia, unspecified: Secondary | ICD-10-CM | POA: Insufficient documentation

## 2017-08-10 DIAGNOSIS — M5030 Other cervical disc degeneration, unspecified cervical region: Secondary | ICD-10-CM | POA: Diagnosis not present

## 2017-08-10 DIAGNOSIS — E119 Type 2 diabetes mellitus without complications: Secondary | ICD-10-CM | POA: Diagnosis not present

## 2017-08-10 DIAGNOSIS — J45909 Unspecified asthma, uncomplicated: Secondary | ICD-10-CM | POA: Insufficient documentation

## 2017-08-10 LAB — BASIC METABOLIC PANEL
Anion gap: 14 (ref 5–15)
BUN: 14 mg/dL (ref 6–20)
CO2: 22 mmol/L (ref 22–32)
CREATININE: 0.94 mg/dL (ref 0.44–1.00)
Calcium: 9.6 mg/dL (ref 8.9–10.3)
Chloride: 100 mmol/L — ABNORMAL LOW (ref 101–111)
GFR, EST NON AFRICAN AMERICAN: 60 mL/min — AB (ref >60)
Glucose, Bld: 113 mg/dL — ABNORMAL HIGH (ref 65–99)
Potassium: 4.3 mmol/L (ref 3.5–5.1)
SODIUM: 136 mmol/L (ref 135–145)

## 2017-08-10 LAB — MAGNESIUM: MAGNESIUM: 1.9 mg/dL (ref 1.7–2.4)

## 2017-08-10 NOTE — Progress Notes (Signed)
Patient ID: Julia BouchardBarbara K Rose, female   DOB: 03-15-1946, 72 y.o.   MRN: [id number]     Primary Care Physician: Kaleen MaskElkins, Wilson Oliver, MD Referring Physician: Dr. Matt HolmesBerry   Julia Rose is a 72 y.o. 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 She was referred by Dr. Allyson SabalBerry for further evaluation 07/12/15.  The afib was paroxysmal at that time. She had been started on edoxaban 30 mg daily for a chadsvasc score of 4. She was aware of fatigue, shortness of breath and chest heaviness when in afib. 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   Returned to afib clinic 09/2016,  after seeing Dr. Allyson SabalBerry, and pt feeling she has been in persistent afib since  fall. For the most part she is minimally symptomatic. She walks with her daughter when the weather is nice 30 mins to an hour and can do so with minimal symptoms. She had a repeat ECHO with normal EF and moderate aortic stenosis which she was told was  stable. She was asked to be seen here to discuss antiarrythmic therapy. She is again planning another trip to [place]  to continue her sewing classes. Continues warfarin for a CHA2DS2VASc score of at least 3. She deferred any change in therapy due to up coming trip.   Returned to afib clinic 10/28/16, and wishes to further discuss restoring SR. When she went on her trip, she noticed how short of breath with activity she had become since the trip last year. People in her group were always waiting for her to catch up with them.She reports that when she makes the bed, she has to rest half way in between. Financial concerns would definitely play into what antiarrythmic she would be able to afford. After discussion of available antiarrythmic's, she was not ready to start AAD but was interested in pursuing cardioversion alone to see if she could return to SR and if her symptoms would improve in SR. She also has moderate aortic stenosis that may be playing a part in he symptoms, but by echo  Dr. Allyson SabalBerry told her this is stable.. She is on warfarin and has had 3 therapeutic warfarin levels checked  with PCP, 5/2 - 2.4, 5/10 - 2.1, 5/16 - 2.3 and INR pending this am with pre procedure labs. Pt aware of risk vrs benefit of procedure and wants to proceed.  F/u afib clinic 5/30 f/u cardioversion. The cardioversion was successful but unfortunately she did return to afib after 3 days. It was long enough for her to notice that she had more energy and less exertional dyspnea. This has made her more interested in pursing SR using an antiarrythmic which she was against before.  F/u 9/20 from hospitalization for sotalol, fortunately, pt did convert with drug and did not require cardioversion. She does have less shortness of breath in SR and more energy. She has done some outdoor walking and deep cleaning at her house which would not have been possible in the past year in afib. She is happy re results.  F/u in afib cloinc, 1/15- She continues in SR and continues to experience less shortness of breath. She does have ongoing fatigue, but improved in SR.Marland Kitchen. She has refused to have sleep study in the past.   Today, she  with minimal  symptoms of palpitations, shortness of breath, no chest pain, orthopnea, PND, lower extremity edema, dizziness, presyncope, syncope, or neurologic sequela. The patient is tolerating medications without difficulties and is  otherwise without complaint today.   Past Medical History:  Diagnosis Date  . Arthritis    "hands" (04/06/2017)  . Asthma   . Atrial fibrillation (HCC)   . Atrial fibrillation with RVR (HCC)   . Chronic back pain    "from the DDD"  . DDD (degenerative disc disease), cervical   . DDD (degenerative disc disease), lumbar   . DDD (degenerative disc disease), thoracic    "not dx'd yet" (04/06/2017)  . Headache   . Heart murmur   . Hyperlipidemia    statin intolerant  . Hypertension   . Migraine    "thru the 1990s; probably stopped after my periods  stopped/hysterectomy"  . Moderate aortic stenosis   . Right bundle branch block   . Seizures (HCC)    "when I was younger; none since the 62s" (04/06/2017)  . Type II diabetes mellitus (HCC)    Past Surgical History:  Procedure Laterality Date  . CARDIOVERSION N/A 12/17/2016   Procedure: CARDIOVERSION;  Surgeon: Elease Hashimoto, Deloris Ping, MD;  Location: [facility] ENDOSCOPY;  Service: Cardiovascular;  Laterality: N/A;  . DIAGNOSTIC LAPAROSCOPY     "paraovarian cyst removed"  . TONSILLECTOMY    . VAGINAL HYSTERECTOMY     "partial"    Current Outpatient Medications  Medication Sig Dispense Refill  . albuterol (PROVENTIL HFA;VENTOLIN HFA) 108 (90 Base) MCG/ACT inhaler Inhale 2 puffs into the lungs every 6 (six) hours as needed for wheezing or shortness of breath.    . Aspirin-Acetaminophen-Caffeine (EXCEDRIN MIGRAINE PO) Take 1-2 tablets by mouth daily as needed (for headache).     Marland Kitchen glimepiride (AMARYL) 4 MG tablet Take 2 mg by mouth daily with breakfast.    . hydrochlorothiazide (HYDRODIURIL) 25 MG tablet Take 25 mg by mouth daily.    . Loperamide HCl (ANTI-DIARRHEAL PO) Take 1 tablet by mouth as needed (for diarrhea).     . losartan (COZAAR) 100 MG tablet Take 50 mg by mouth at bedtime.  0  . lovastatin (MEVACOR) 20 MG tablet Take 20 mg by mouth daily at 6 PM.     . Magnesium 250 MG TABS Take 1 tablet by mouth daily.    . metFORMIN (GLUCOPHAGE) 1000 MG tablet Take 500-1,000 mg by mouth See admin instructions. Take 1000 mg by mouth in the morning    . Nutritional Supplements (JUICE PLUS FIBRE PO) Take by mouth.    . nystatin-triamcinolone (MYCOLOG II) cream Apply 1 application topically 2 (two) times daily as needed (for yeast infection).    Marland Kitchen PHENobarbital (LUMINAL) 97.2 MG tablet Take 97.2 mg by mouth at bedtime.   4  . Simethicone (GAS RELIEF) 180 MG CAPS Take 180 mg by mouth as needed (for gas).     . sotalol (BETAPACE) 120 MG tablet Take 1 tablet (120 mg total) by mouth every 12 (twelve) hours.  60 tablet 6  . warfarin (COUMADIN) 5 MG tablet Take 1 tablet (5 mg total) by mouth daily. (Patient taking differently: Take 7.5-10 mg by mouth See admin instructions. Take 7.5 mg by mouth daily for one day then take 10 mg by mouth daily for 4 days, then start cycle over) 30 tablet 0   No current facility-administered medications for this encounter.     Allergies  Allergen Reactions  . Dilantin [Phenytoin] Hives and Other (See Comments)    Whelps all over for two weeks  . Statins Other (See Comments)    High dose statin - dizziness    Social History  Socioeconomic History  . Marital status: Married    Spouse name: Not on file  . Number of children: Not on file  . Years of education: Not on file  . Highest education level: Not on file  Social Needs  . Financial resource strain: Not on file  . Food insecurity - worry: Not on file  . Food insecurity - inability: Not on file  . Transportation needs - medical: Not on file  . Transportation needs - non-medical: Not on file  Occupational History  . Not on file  Tobacco Use  . Smoking status: Never Smoker  . Smokeless tobacco: Never Used  Substance and Sexual Activity  . Alcohol use: No  . Drug use: No  . Sexual activity: Not Currently  Other Topics Concern  . Not on file  Social History Narrative  . Not on file    Family History  Problem Relation Age of Onset  . Kidney disease Mother   . Diabetes Mother   . Dementia Mother   . Hypertension Mother     ROS- All systems are reviewed and negative except as per the HPI above  Physical Exam: Vitals:   08/10/17 1033  BP: 132/78  Pulse: 65  Weight: 199 lb (90.3 kg)  Height: 5\' 2"  (1.575 m)    GEN- The patient is well appearing, alert and oriented x 3 today.   Head- normocephalic, atraumatic Eyes-  Sclera clear, conjunctiva pink Ears- hearing intact Oropharynx- clear Neck- supple, no JVP, soft bruits, rt louder than left,? Possible radiation of systolic  murmur Lymph- no cervical lymphadenopathy Lungs- Clear to ausculation bilaterally, normal work of breathing Heart- regular rate and rhythm, 2/6 systolic murmur rubs or gallops, PMI not laterally displaced GI- soft, NT, ND, + BS Extremities- no clubbing, cyanosis, or edema MS- no significant deformity or atrophy Skin- no rash or lesion Psych- euthymic mood, full affect Neuro- strength and sensation are intact  EKG- Sinus brady at 58 bpm, pr int 160 ms, qrs int 136 ms, qtc 463 ms Epic records reviewed Echo-Study Conclusions 08/31/16-  Study Conclusions  - Left ventricle: The cavity size was normal. Wall thickness was   normal. Systolic function was vigorous. The estimated ejection   fraction was in the range of 65% to 70%. Wall motion was normal;   there were no regional wall motion abnormalities. The study is   not technically sufficient to allow evaluation of LV diastolic   function. - Aortic valve: Trileaflet; moderately thickened, severely   calcified leaflets. Valve mobility was restricted. There was   moderate stenosis. There was trivial regurgitation. Peak velocity   (S): 299 cm/s. Mean gradient (S): 17 mm Hg. - Mitral valve: There was mild regurgitation. - Left atrium: The atrium was mildly dilated. Volume/bsa, S: 37.2   ml/m^2. - Right atrium: The atrium was mildly dilated. - Tricuspid valve: There was moderate regurgitation. - Pulmonary arteries: Systolic pressure was mildly increased. PA   peak pressure: 35 mm Hg (S).  Impressions:  - Compared to the prior study, there has been no significant   interval change.   Assessment and Plan: 1 Persistent afib  Hospitalized for sotalol loading 03/2017 which was successful She is maintaining SR Bmet/mag today  2. HTN Stable  3. Chadsvasc score of at least 4 Continue warfarin  4.Moderate aortic stenosis Per Dr. Allyson Sabal, scheduled to have repeat echo 09/02/2016  F/u in afib clinic in 4 months    Julia Rose. [facility]  [facility] [address] [place], [place] [address] [phone]

## 2017-08-31 ENCOUNTER — Other Ambulatory Visit: Payer: Self-pay

## 2017-08-31 ENCOUNTER — Ambulatory Visit (HOSPITAL_COMMUNITY): Payer: Medicare Other | Attending: Cardiovascular Disease

## 2017-08-31 DIAGNOSIS — E785 Hyperlipidemia, unspecified: Secondary | ICD-10-CM | POA: Diagnosis not present

## 2017-08-31 DIAGNOSIS — R011 Cardiac murmur, unspecified: Secondary | ICD-10-CM | POA: Diagnosis not present

## 2017-08-31 DIAGNOSIS — I4891 Unspecified atrial fibrillation: Secondary | ICD-10-CM | POA: Insufficient documentation

## 2017-08-31 DIAGNOSIS — I350 Nonrheumatic aortic (valve) stenosis: Secondary | ICD-10-CM | POA: Insufficient documentation

## 2017-08-31 DIAGNOSIS — I10 Essential (primary) hypertension: Secondary | ICD-10-CM | POA: Insufficient documentation

## 2017-08-31 DIAGNOSIS — E119 Type 2 diabetes mellitus without complications: Secondary | ICD-10-CM | POA: Diagnosis not present

## 2017-09-03 ENCOUNTER — Ambulatory Visit: Payer: Medicare Other | Admitting: Cardiovascular Disease

## 2017-09-08 ENCOUNTER — Other Ambulatory Visit: Payer: Self-pay | Admitting: Cardiovascular Disease

## 2017-09-08 DIAGNOSIS — I350 Nonrheumatic aortic (valve) stenosis: Secondary | ICD-10-CM

## 2017-09-14 ENCOUNTER — Ambulatory Visit: Payer: Medicare Other | Admitting: Cardiovascular Disease

## 2017-09-14 ENCOUNTER — Encounter: Payer: Self-pay | Admitting: Cardiovascular Disease

## 2017-09-14 VITALS — BP 118/70 | HR 90 | Ht 62.0 in | Wt 196.0 lb

## 2017-09-14 DIAGNOSIS — I481 Persistent atrial fibrillation: Secondary | ICD-10-CM

## 2017-09-14 DIAGNOSIS — I350 Nonrheumatic aortic (valve) stenosis: Secondary | ICD-10-CM | POA: Diagnosis not present

## 2017-09-14 DIAGNOSIS — G4730 Sleep apnea, unspecified: Secondary | ICD-10-CM

## 2017-09-14 DIAGNOSIS — I4819 Other persistent atrial fibrillation: Secondary | ICD-10-CM

## 2017-09-14 DIAGNOSIS — E7800 Pure hypercholesterolemia, unspecified: Secondary | ICD-10-CM

## 2017-09-14 NOTE — Assessment & Plan Note (Signed)
History of hyperlipidemia on statin therapy followed by her PCP. 

## 2017-09-14 NOTE — Progress Notes (Signed)
09/14/2017 Julia CLEMENSON   1946-02-08  [id number]  Primary Physician Kaleen Mask, MD Primary Cardiologist: Runell Gess MD Nicholes Calamity, [place]  HPI:  Julia Rose is a 72 y.o.  moderately overweight married Caucasian female mother of 4 children, grandmother of a grandchildren whose husband Julia Rose is also a patient of mine. She was referred by Dr. Jeannetta Nap for evaluation of recently recognized atrial fibrillation. I last saw her in the office  09/02/16. Her cardiac risk factor profile is notable for treated hypertension, diabetes. She has hyperlipidemia intolerant to statin drugs. She's never had a heart stroke. She occasionally gets chest tightness. 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 She was recently found to be in A. Fib during a routine doctor's visit and was referred here for further evaluation. 8. Event monitor showed A. Fib with heart rates as high as 170. 2-D echo revealed normal LV systolic function with mild to moderate aortic stenosis and a Myoview stress test was low risk and nonischemic. Carotid Doppler showed no evidence of ICA stenosis. She was placed on oral anticoagulation. Today she is in atrial fibrillation with a controlled ventricular response on Coumadin anticoagulation. She underwent inpatient sotalol loading in the fall of last year and saw Rudi Coco and the A. fib clinic last month at which time she was in sinus bradycardia. She is back in A. fib. I was unaware of this. Recent 2-D echocardiogram performed 08/31/17 revealed progression of her aortic stenosis with a valve area of 1.08 cm and a peak gradient of 36 mmHg. She did tell me that recently one of her brothers died of ischemic heart disease post valve replacement and bypass grafting and another brother had bypass grafting as well.   Current Meds  Medication Sig  . albuterol (PROVENTIL HFA;VENTOLIN HFA) 108 (90 Base) MCG/ACT inhaler Inhale 2 puffs into the lungs every  6 (six) hours as needed for wheezing or shortness of breath.  . Aspirin-Acetaminophen-Caffeine (EXCEDRIN MIGRAINE PO) Take 1-2 tablets by mouth daily as needed (for headache).   Marland Kitchen glimepiride (AMARYL) 4 MG tablet Take 2 mg by mouth daily with breakfast.  . hydrochlorothiazide (HYDRODIURIL) 25 MG tablet Take 25 mg by mouth daily.  . Loperamide HCl (ANTI-DIARRHEAL PO) Take 1 tablet by mouth as needed (for diarrhea).   . losartan (COZAAR) 100 MG tablet Take 50 mg by mouth at bedtime.  . lovastatin (MEVACOR) 20 MG tablet Take 20 mg by mouth daily at 6 PM.   . Magnesium 250 MG TABS Take 1 tablet by mouth daily.  . metFORMIN (GLUCOPHAGE) 1000 MG tablet Take 500-1,000 mg by mouth See admin instructions. Take 1000 mg by mouth in the morning  . Nutritional Supplements (JUICE PLUS FIBRE PO) Take by mouth.  . nystatin-triamcinolone (MYCOLOG II) cream Apply 1 application topically 2 (two) times daily as needed (for yeast infection).  Marland Kitchen PHENobarbital (LUMINAL) 97.2 MG tablet Take 97.2 mg by mouth at bedtime.   . Simethicone (GAS RELIEF) 180 MG CAPS Take 180 mg by mouth as needed (for gas).   . sotalol (BETAPACE) 120 MG tablet Take 1 tablet (120 mg total) by mouth every 12 (twelve) hours.  Marland Kitchen warfarin (COUMADIN) 5 MG tablet Take 1 tablet (5 mg total) by mouth daily. (Patient taking differently: Take 7.5-10 mg by mouth See admin instructions. Take 7.5 mg by mouth daily for one day then take 10 mg by mouth daily for 4 days, then start cycle over)  Allergies  Allergen Reactions  . Dilantin [Phenytoin] Hives and Other (See Comments)    Whelps all over for two weeks  . Statins Other (See Comments)    High dose statin - dizziness    Social History   Socioeconomic History  . Marital status: Married    Spouse name: Not on file  . Number of children: Not on file  . Years of education: Not on file  . Highest education level: Not on file  Social Needs  . Financial resource strain: Not on file  . Food  insecurity - worry: Not on file  . Food insecurity - inability: Not on file  . Transportation needs - medical: Not on file  . Transportation needs - non-medical: Not on file  Occupational History  . Not on file  Tobacco Use  . Smoking status: Never Smoker  . Smokeless tobacco: Never Used  Substance and Sexual Activity  . Alcohol use: No  . Drug use: No  . Sexual activity: Not Currently  Other Topics Concern  . Not on file  Social History Narrative  . Not on file     Review of Systems: General: negative for chills, fever, night sweats or weight changes.  Cardiovascular: negative for chest pain, dyspnea on exertion, edema, orthopnea, palpitations, paroxysmal nocturnal dyspnea or shortness of breath Dermatological: negative for rash Respiratory: negative for cough or wheezing Urologic: negative for hematuria Abdominal: negative for nausea, vomiting, diarrhea, bright red blood per rectum, melena, or hematemesis Neurologic: negative for visual changes, syncope, or dizziness All other systems reviewed and are otherwise negative except as noted above.    Blood pressure 118/70, pulse 90, height 5\' 2"  (1.575 m), weight 196 lb (88.9 kg).  General appearance: alert and no distress Neck: no adenopathy, no carotid bruit, no JVD, supple, symmetrical, trachea midline and thyroid not enlarged, symmetric, no tenderness/mass/nodules Lungs: clear to auscultation bilaterally Heart: Soft outflow tract murmur Extremities: extremities normal, atraumatic, no cyanosis or edema Pulses: 2+ and symmetric Skin: Skin color, texture, turgor normal. No rashes or lesions  EKG atrial fibrillation with rapid ventricular response of 90 and right bundle branch block. I personally reviewed his EKG  ASSESSMENT AND PLAN:   Atrial fibrillation (HCC) History of paroxysmal atrial fibrillation now with recurrent A. fib with rapid ventricular response of 90 on Coumadin anticoagulation and sotalol. She had sotalol  loading in the hospital in the fall and was last seen by Rudi Cocoonna Carroll in the A. fib clinic a month ago in sinus bradycardia. Can't refer her back to Rudi Cocoonna Carroll for consideration of discontinuing sotalol and beginning an alternative rate lowering drugs.  Essential hypertension History of essential hypertension blood pressure measured today at 118/70. She is on hydrochlorothiazide, losartan. Continued current meds at current dosing.  Hyperlipidemia History of hyperlipidemia on statin therapy followed by her PCP  Aortic stenosis, moderate History of moderate aortic stenosis by 2-D echo performed 08/31/17 normal LV systolic function and aortic valve area of 1.08 cm with a peak gradient of 36 mmHg. Will continue to follow this on an annual basis.      Runell GessJonathan J. Areya Lemmerman MD FACP,FACC,FAHA, [facility] 09/14/2017 9:13 AM

## 2017-09-14 NOTE — Assessment & Plan Note (Signed)
History of paroxysmal atrial fibrillation now with recurrent A. fib with rapid ventricular response of 90 on Coumadin anticoagulation and sotalol. She had sotalol loading in the hospital in the fall and was last seen by Rudi Cocoonna Carroll in the A. fib clinic a month ago in sinus bradycardia. Can't refer her back to Rudi Cocoonna Carroll for consideration of discontinuing sotalol and beginning an alternative rate lowering drugs.

## 2017-09-14 NOTE — Assessment & Plan Note (Signed)
History of essential hypertension blood pressure measured today at 118/70. She is on hydrochlorothiazide, losartan. Continued current meds at current dosing.

## 2017-09-14 NOTE — Patient Instructions (Signed)
Medication Instructions: Your physician recommends that you continue on your current medications as directed. Please refer to the Current Medication list given to you today.   Testing/Procedures: Your physician has recommended that you have a sleep study. This test records several body functions during sleep, including: brain activity, eye movement, oxygen and carbon dioxide blood levels, heart rate and rhythm, breathing rate and rhythm, the flow of air through your mouth and nose, snoring, body muscle movements, and chest and belly movement.  Your physician has requested that you have an echocardiogram--in 1 year. Echocardiography is a painless test that uses sound waves to create images of your heart. It provides your doctor with information about the size and shape of your heart and how well your heart's chambers and valves are working. This procedure takes approximately one hour. There are no restrictions for this procedure.   Follow-Up: Your physician recommends that you schedule an appt with Donna CarrollRudi Coco, NP in the AFIB Clinic.  Your physician wants you to follow-up in: 6 months with Dr. Allyson SabalBerry. You will receive a reminder letter in the mail two months in advance. If you don't receive a letter, please call our office to schedule the follow-up appointment.  If you need a refill on your cardiac medications before your next appointment, please call your pharmacy.

## 2017-09-14 NOTE — Assessment & Plan Note (Signed)
History of moderate aortic stenosis by 2-D echo performed 08/31/17 normal LV systolic function and aortic valve area of 1.08 cm with a peak gradient of 36 mmHg. Will continue to follow this on an annual basis.

## 2017-09-21 ENCOUNTER — Encounter (HOSPITAL_COMMUNITY): Payer: Self-pay | Admitting: Nurse Practitioner

## 2017-09-21 ENCOUNTER — Ambulatory Visit (HOSPITAL_COMMUNITY)
Admission: RE | Admit: 2017-09-21 | Discharge: 2017-09-21 | Disposition: A | Discharge status: Home / Self Care | Payer: Medicare Other | Source: Ambulatory Visit | Attending: Nurse Practitioner | Admitting: Nurse Practitioner

## 2017-09-21 VITALS — BP 124/76 | HR 60 | Ht 62.0 in | Wt 197.0 lb

## 2017-09-21 DIAGNOSIS — Z7901 Long term (current) use of anticoagulants: Secondary | ICD-10-CM | POA: Insufficient documentation

## 2017-09-21 DIAGNOSIS — Z79899 Other long term (current) drug therapy: Secondary | ICD-10-CM | POA: Diagnosis not present

## 2017-09-21 DIAGNOSIS — I350 Nonrheumatic aortic (valve) stenosis: Secondary | ICD-10-CM | POA: Insufficient documentation

## 2017-09-21 DIAGNOSIS — I4510 Unspecified right bundle-branch block: Secondary | ICD-10-CM | POA: Insufficient documentation

## 2017-09-21 DIAGNOSIS — I480 Paroxysmal atrial fibrillation: Secondary | ICD-10-CM | POA: Diagnosis not present

## 2017-09-21 DIAGNOSIS — E785 Hyperlipidemia, unspecified: Secondary | ICD-10-CM | POA: Insufficient documentation

## 2017-09-21 DIAGNOSIS — Z888 Allergy status to other drugs, medicaments and biological substances status: Secondary | ICD-10-CM | POA: Diagnosis not present

## 2017-09-21 DIAGNOSIS — J45909 Unspecified asthma, uncomplicated: Secondary | ICD-10-CM | POA: Diagnosis not present

## 2017-09-21 DIAGNOSIS — I481 Persistent atrial fibrillation: Secondary | ICD-10-CM | POA: Diagnosis not present

## 2017-09-21 DIAGNOSIS — Z7982 Long term (current) use of aspirin: Secondary | ICD-10-CM | POA: Diagnosis not present

## 2017-09-21 DIAGNOSIS — Z7984 Long term (current) use of oral hypoglycemic drugs: Secondary | ICD-10-CM | POA: Diagnosis not present

## 2017-09-21 DIAGNOSIS — I10 Essential (primary) hypertension: Secondary | ICD-10-CM | POA: Diagnosis not present

## 2017-09-21 DIAGNOSIS — E119 Type 2 diabetes mellitus without complications: Secondary | ICD-10-CM | POA: Insufficient documentation

## 2017-09-21 DIAGNOSIS — I4891 Unspecified atrial fibrillation: Secondary | ICD-10-CM | POA: Diagnosis present

## 2017-09-21 NOTE — Progress Notes (Signed)
Patient ID: Julia Rose, female   DOB: 12-25-1945, 72 y.o.   MRN: [id number]     Primary Care Physician: Julia Mask, MD Referring Physician: Dr. Matt Rose Julia Rose is a 72 y.o. 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 She was referred by Dr. Allyson Rose for further evaluation 07/12/15.  The afib was paroxysmal at that time. She had been started on edoxaban 30 mg daily for a chadsvasc score of 4. She was aware of fatigue, shortness of breath and chest heaviness when in afib. 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   Returned to afib clinic 09/2016,  after seeing Dr. Allyson Rose, and pt feeling she has been in persistent afib since  fall. For the most part she is minimally symptomatic. She walks with her daughter when the weather is nice 30 mins to an hour and can do so with minimal symptoms. She had a repeat ECHO with normal EF and moderate aortic stenosis which she was told was  stable. She was asked to be seen here to discuss antiarrythmic therapy. 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 Continues warfarin for a CHA2DS2VASc score of at least 3. She deferred any change in therapy due to up coming trip.   Returned to afib clinic 10/28/16, and wishes to further discuss restoring SR. When she went on her trip, she noticed how short of breath with activity she had become since the trip last year. People in her group were always waiting for her to catch up with them.She reports that when she makes the bed, she has to rest half way in between. Financial concerns would definitely play into what antiarrythmic she would be able to afford. After discussion of available antiarrythmic's, she was not ready to start AAD but was interested in pursuing cardioversion alone to see if she could return to SR and if her symptoms would improve in SR. She also has moderate aortic stenosis that may be playing a part in he symptoms, but by echo  Dr. Allyson Rose told her this is stable.. She is on warfarin and has had 3 therapeutic warfarin levels checked  with PCP, 5/2 - 2.4, 5/10 - 2.1, 5/16 - 2.3 and INR pending this am with pre procedure labs. Pt aware of risk vrs benefit of procedure and wants to proceed.  F/u afib clinic 5/30 f/u cardioversion. The cardioversion was successful but unfortunately she did return to afib after 3 days. It was long enough for her to notice that she had more energy and less exertional dyspnea. This has made her more interested in pursing SR using an antiarrythmic which she was against before.  F/u 9/20 from hospitalization for sotalol, fortunately, pt did convert with drug and did not require cardioversion. She does have less shortness of breath in SR and more energy. She has done some outdoor walking and deep cleaning at her house which would not have been possible in the past year in afib. She is happy re results.  F/u in afib clinic, 1/15- She continues in SR and continues to experience less shortness of breath. She does have ongoing fatigue, but improved in SR.Marland Rose She has refused to have sleep study in the past.   F/u in afib clinic 09/21/17, at the request of Dr. Allyson Rose, from finding afib at his recent office visit. Today pt is back in SR. She states that she had more stress lately as [place]  [place] and [place] went [place] to stay with him. She has had her echo repeated recently and fairly stable with  mod aortic stenosis, diastolic dysfunction.  Today, she  with minimal  symptoms of palpitations, shortness of breath, no chest pain, orthopnea, PND, lower extremity edema, dizziness, presyncope, syncope, or neurologic sequela. The patient is tolerating medications without difficulties and is otherwise without complaint today.   Past Medical History:  Diagnosis Date  . Arthritis    "hands" (04/06/2017)  . Asthma   . Atrial fibrillation (HCC)   . Atrial fibrillation with RVR (HCC)   . Chronic  back pain    "from the DDD"  . DDD (degenerative disc disease), cervical   . DDD (degenerative disc disease), lumbar   . DDD (degenerative disc disease), thoracic    "not dx'd yet" (04/06/2017)  . Headache   . Heart murmur   . Hyperlipidemia    statin intolerant  . Hypertension   . Migraine    "thru the 1990s; probably stopped after my periods stopped/hysterectomy"  . Moderate aortic stenosis   . Right bundle branch block   . Seizures (HCC)    "when I was younger; none since the 521980s" (04/06/2017)  . Type II diabetes mellitus (HCC)    Past Surgical History:  Procedure Laterality Date  . CARDIOVERSION N/A 12/17/2016   Procedure: CARDIOVERSION;  Surgeon: Julia HashimotoNahser, Deloris PingPhilip J, MD;  [facility];  Service: Cardiovascular;  Laterality: N/A;  . DIAGNOSTIC LAPAROSCOPY     "paraovarian cyst removed"  . TONSILLECTOMY    . VAGINAL HYSTERECTOMY     "partial"    Current Outpatient Medications  Medication Sig Dispense Refill  . albuterol (PROVENTIL HFA;VENTOLIN HFA) 108 (90 Base) MCG/ACT inhaler Inhale 2 puffs into the lungs every 6 (six) hours as needed for wheezing or shortness of breath.    . Aspirin-Acetaminophen-Caffeine (EXCEDRIN MIGRAINE PO) Take 1-2 tablets by mouth daily as needed (for headache).     Marland Rose. glimepiride (AMARYL) 4 MG tablet Take 2 mg by mouth daily with breakfast.    . hydrochlorothiazide (HYDRODIURIL) 25 MG tablet Take 25 mg by mouth daily.    . Loperamide HCl (ANTI-DIARRHEAL PO) Take 1 tablet by mouth as needed (for diarrhea).     . losartan (COZAAR) 100 MG tablet Take 50 mg by mouth at bedtime.  0  . lovastatin (MEVACOR) 20 MG tablet Take 20 mg by mouth daily at 6 PM.     . Magnesium 250 MG TABS Take 1 tablet by mouth daily.    . metFORMIN (GLUCOPHAGE) 1000 MG tablet Take 500-1,000 mg by mouth See admin instructions. Take 1000 mg by mouth in the morning    . Nutritional Supplements (JUICE PLUS FIBRE PO) Take by mouth.    . nystatin-triamcinolone (MYCOLOG II)  cream Apply 1 application topically 2 (two) times daily as needed (for yeast infection).    Marland Rose. PHENobarbital (LUMINAL) 97.2 MG tablet Take 97.2 mg by mouth at bedtime.   4  . Simethicone (GAS RELIEF) 180 MG CAPS Take 180 mg by mouth as needed (for gas).     . sotalol (BETAPACE) 120 MG tablet Take 1 tablet (120 mg total) by mouth every 12 (twelve) hours. 60 tablet 6  . warfarin (COUMADIN) 5 MG tablet Take 1 tablet (5 mg total) by mouth daily. (Patient taking differently: Take 7.5-10 mg by mouth See admin instructions. Take 7.5 mg by mouth daily for one day then take 10 mg by  mouth daily for 4 days, then start cycle over) 30 tablet 0   No current facility-administered medications for this encounter.     Allergies  Allergen Reactions  . Dilantin [Phenytoin] Hives and Other (See Comments)    Whelps all over for two weeks  . Statins Other (See Comments)    High dose statin - dizziness    Social History   Socioeconomic History  . Marital status: Married    Spouse name: Not on file  . Number of children: Not on file  . Years of education: Not on file  . Highest education level: Not on file  Social Needs  . Financial resource strain: Not on file  . Food insecurity - worry: Not on file  . Food insecurity - inability: Not on file  . Transportation needs - medical: Not on file  . Transportation needs - non-medical: Not on file  Occupational History  . Not on file  Tobacco Use  . Smoking status: Never Smoker  . Smokeless tobacco: Never Used  Substance and Sexual Activity  . Alcohol use: No  . Drug use: No  . Sexual activity: Not Currently  Other Topics Concern  . Not on file  Social History Narrative  . Not on file    Family History  Problem Relation Age of Onset  . Kidney disease Mother   . Diabetes Mother   . Dementia Mother   . Hypertension Mother     ROS- All systems are reviewed and negative except as per the HPI above  Physical Exam: Vitals:   09/21/17 0919  BP:  124/76  Pulse: 60  Weight: 197 lb (89.4 kg)  Height: 5\' 2"  (1.575 m)    GEN- The patient is well appearing, alert and oriented x 3 today.   Head- normocephalic, atraumatic Eyes-  Sclera clear, conjunctiva pink Ears- hearing intact Oropharynx- clear Neck- supple, no JVP, soft bruits, rt louder than left,? Possible radiation of systolic murmur Lymph- no cervical lymphadenopathy Lungs- Clear to ausculation bilaterally, normal work of breathing Heart- regular rate and rhythm, 2/6 systolic murmur rubs or gallops, PMI not laterally displaced GI- soft, NT, ND, + BS Extremities- no clubbing, cyanosis, or edema MS- no significant deformity or atrophy Skin- no rash or lesion Psych- euthymic mood, full affect Neuro- strength and sensation are intact  EKG- NSR, RBBB, pr int 166 ms, qrs int 140 ms, qtc 486 ms Epic records reviewed Echo-Study Conclusions 08/31/16-  Echo-08/31/17  Study Conclusions  - Left ventricle: The cavity size was normal. Wall thickness was   normal. Systolic function was normal. The estimated ejection   fraction was in the range of 60% to 65%. Wall motion was normal;   there were no regional wall motion abnormalities. There was a   reduced contribution of atrial contraction to ventricular   filling, due to increased ventricular diastolic pressure or   atrial contractile dysfunction (e.g. recent conversion of atrial   fibrillation). Doppler parameters are consistent with restrictive   physiology, indicative of decreased left ventricular diastolic   compliance and/or increased left atrial pressure. - Aortic valve: There was moderate stenosis. - Left atrium: The atrium was mildly dilated. - Tricuspid valve: There was mild-moderate regurgitation directed   centrally. - Pulmonary arteries: Systolic pressure was mildly to moderately   increased. PA peak pressure: 46 mm Hg (S).  Impressions:  - Compared to February 2018 and January 2017, there is slight   worsening  of aortic stenosis and substantial worsening of  diastolic LV function.    Assessment and Plan: 1 Persistent afib  Hospitalized for sotalol loading 03/2017 which was successful Recently caught in afib but now back in SR Scheduled for sleep study  2. HTN Stable  3. Chadsvasc score of at least 4 Continue warfarin  4.Moderate aortic stenosis Per Dr. Allyson Rose, scheduled to have repeat echo in one year  F/u in afib clinic in 3 months    Lupita Leash C[facility] [address] [place], [place] [address] [phone]

## 2017-09-27 ENCOUNTER — Ambulatory Visit (HOSPITAL_BASED_OUTPATIENT_CLINIC_OR_DEPARTMENT_OTHER): Payer: Medicare Other | Attending: Cardiovascular Disease | Admitting: Cardiovascular Disease

## 2017-09-27 VITALS — Ht 62.0 in | Wt 195.0 lb

## 2017-09-27 DIAGNOSIS — G4733 Obstructive sleep apnea (adult) (pediatric): Secondary | ICD-10-CM

## 2017-09-27 DIAGNOSIS — G4730 Sleep apnea, unspecified: Secondary | ICD-10-CM | POA: Insufficient documentation

## 2017-09-27 DIAGNOSIS — Z7984 Long term (current) use of oral hypoglycemic drugs: Secondary | ICD-10-CM | POA: Diagnosis not present

## 2017-09-27 DIAGNOSIS — Z79899 Other long term (current) drug therapy: Secondary | ICD-10-CM | POA: Diagnosis not present

## 2017-09-27 DIAGNOSIS — I480 Paroxysmal atrial fibrillation: Secondary | ICD-10-CM | POA: Diagnosis not present

## 2017-09-27 DIAGNOSIS — Z7901 Long term (current) use of anticoagulants: Secondary | ICD-10-CM | POA: Insufficient documentation

## 2017-09-27 DIAGNOSIS — G4736 Sleep related hypoventilation in conditions classified elsewhere: Secondary | ICD-10-CM | POA: Insufficient documentation

## 2017-10-13 ENCOUNTER — Encounter (HOSPITAL_BASED_OUTPATIENT_CLINIC_OR_DEPARTMENT_OTHER): Payer: Self-pay | Admitting: Cardiovascular Disease

## 2017-10-13 NOTE — Procedures (Signed)
Patient Name: Julia Rose, Julia Rose Date: 09/27/2017 Gender: Female D.O.B: 04-03-1946 Age (years): [age] Referring Provider: Runell GessJonathan J Berry Height (inches): 62 Interpreting Physician: Nicki Guadalajarahomas Kelly MD, ABSM Weight (lbs): 195 RPSGT: Armen PickupFord, Evelyn BMI: 36 MRN: [id number] Neck Size: 14.00  CLINICAL INFORMATION Sleep Rose Type: NPSG  Indication for sleep Rose: OSA, Snoring, PAF  Epworth Sleepiness Score: 3  SLEEP Rose TECHNIQUE As per the AASM Manual for the Scoring of Sleep and Associated Events v2.3 (April 2016) with a hypopnea requiring 4% desaturations.  The channels recorded and monitored were frontal, central and occipital EEG, electrooculogram (EOG), submentalis EMG (chin), nasal and oral airflow, thoracic and abdominal wall motion, anterior tibialis EMG, snore microphone, electrocardiogram, and pulse oximetry.  MEDICATIONS     albuterol (PROVENTIL HFA;VENTOLIN HFA) 108 (90 Base) MCG/ACT inhaler         Aspirin-Acetaminophen-Caffeine (EXCEDRIN MIGRAINE PO)         glimepiride (AMARYL) 4 MG tablet         hydrochlorothiazide (HYDRODIURIL) 25 MG tablet         Loperamide HCl (ANTI-DIARRHEAL PO)         losartan (COZAAR) 100 MG tablet         lovastatin (MEVACOR) 20 MG tablet         Magnesium 250 MG TABS         metFORMIN (GLUCOPHAGE) 1000 MG tablet         Nutritional Supplements (JUICE PLUS FIBRE PO)         nystatin-triamcinolone (MYCOLOG II) cream         PHENobarbital (LUMINAL) 97.2 MG tablet         Simethicone (GAS RELIEF) 180 MG CAPS         sotalol (BETAPACE) 120 MG tablet         warfarin (COUMADIN) 5 MG tablet      Medications self-administered by patient taken the night of the Rose : PHONOBARBITAL  SLEEP ARCHITECTURE The Rose was initiated at 10:22:54 PM and ended at 4:28:50 AM.  Sleep onset time was 26.2 minutes and the sleep efficiency was 74.9%%. The total sleep time was 274.0 minutes.  Stage REM latency was 217.0 minutes.  The patient  spent 4.0%% of the night in stage N1 sleep, 41.6%% in stage N2 sleep, 48.4%% in stage N3 and 6.02% in REM.  Alpha intrusion was absent.  Supine sleep was 36.13%.  RESPIRATORY PARAMETERS The overall apnea/hypopnea index (AHI) was 2.0 per hour.  The respiratory disturbances index (RDI) was 6.8 per hour. There were 1 total apneas, including 0 obstructive, 1 central and 0 mixed apneas. There were 8 hypopneas and 22 RERAs.  The AHI during Stage REM sleep was 21.8 per hour.  AHI while supine was 0.0 per hour.  The mean oxygen saturation was 90.0%. The minimum SpO2 during sleep was 85.0%.  Loud snoring was noted during this Rose.  CARDIAC DATA The 2 lead EKG demonstrated sinus rhythm. The mean heart rate was 66.8 beats per minute. Other EKG findings include: None.  LEG MOVEMENT DATA The total PLMS were 0 with a resulting PLMS index of 0.0. Associated arousal with leg movement index was 0.0 .  IMPRESSIONS - Increased upper airway syndrome (UARS) overall with an AHI 2.0/h/ RDI of 6.8.  However, there was moderate sleep apnea during REM sleep  (AHI 21.8/h).  - No significant central sleep apnea occurred during this Rose (CAI = 0.2/h). - Mild oxygen desaturation to a nadir of 85%  during REM sleep.  - Abnormal sleep architecture with prolonged latency to REM sleep. - The arousal index was abnormal. - The patient snored with loud snoring volume. - No cardiac abnormalities were noted during this Rose. - Clinically significant periodic limb movements did not occur during sleep. No significant associated arousals.  DIAGNOSIS - Sleep Apnea, unspecified  G47.30 - Nocturnal Hypoxemia (327.26 [G47.36 ICD-10])  RECOMMENDATIONS - Efforts should be made to optimize nasal and oral pharyngeal patency. - At present, patient may not meet criteria for CPAP based on AHI.  However, consider alternatives for loud snoring and consider possible customized oral appliance with moderate sleep apnea during REM  sleep.  Consider repeat evaluation if symptoms progress. - Avoid alcohol, sedatives and other CNS depressants that may worsen sleep apnea and disrupt normal sleep architecture. - Sleep hygiene should be reviewed to assess factors that may improve sleep quality. - Weight management (BMI 36) and regular exercise should be initiated or continued if appropriate.  [Electronically signed] 10/13/2017 06:40 PM  Nicki Guadalajara MD, [facility], ABSM Diplomate, American Board of Sleep Medicine   NPI: [id number] [facility] PH: [phone]   FX: [phone] ACCREDITED BY THE AMERICAN ACADEMY OF SLEEP MEDICINE

## 2017-10-14 ENCOUNTER — Telehealth: Payer: Self-pay | Admitting: *Deleted

## 2017-10-14 NOTE — Telephone Encounter (Signed)
Patient informed of sleep study results and recommendations. She states that she would like to consult with her daughter who works for a [employer], [employer]. She wants to maybe speak with her daughter's employer to see if he makes oral appliances or if he has a recommendation. She requests for a copy of her sleep study to be left at the front for her to pick up. She will call back to let me know what she decides to do concerning her treatment.

## 2017-10-14 NOTE — Progress Notes (Signed)
Patient notified of sleep study results and recommendations. 

## 2017-10-14 NOTE — Telephone Encounter (Signed)
-----   Message from Lennette Biharihomas A Kelly, MD sent at 10/13/2017  6:44 PM EDT ----- Julia Rose .  Please notify the patient the results of her sleep study.

## 2017-11-08 ENCOUNTER — Other Ambulatory Visit: Payer: Self-pay | Admitting: Physician Assistant

## 2017-12-01 ENCOUNTER — Encounter (HOSPITAL_COMMUNITY): Payer: Self-pay | Admitting: Nurse Practitioner

## 2017-12-01 ENCOUNTER — Ambulatory Visit (HOSPITAL_COMMUNITY)
Admission: RE | Admit: 2017-12-01 | Discharge: 2017-12-01 | Disposition: A | Discharge status: Home / Self Care | Payer: Medicare Other | Source: Ambulatory Visit | Attending: Nurse Practitioner | Admitting: Nurse Practitioner

## 2017-12-01 VITALS — BP 138/76 | HR 60 | Ht 62.0 in | Wt 199.8 lb

## 2017-12-01 DIAGNOSIS — Z7982 Long term (current) use of aspirin: Secondary | ICD-10-CM | POA: Insufficient documentation

## 2017-12-01 DIAGNOSIS — J45909 Unspecified asthma, uncomplicated: Secondary | ICD-10-CM | POA: Diagnosis not present

## 2017-12-01 DIAGNOSIS — I4510 Unspecified right bundle-branch block: Secondary | ICD-10-CM | POA: Insufficient documentation

## 2017-12-01 DIAGNOSIS — E785 Hyperlipidemia, unspecified: Secondary | ICD-10-CM | POA: Diagnosis not present

## 2017-12-01 DIAGNOSIS — Z79899 Other long term (current) drug therapy: Secondary | ICD-10-CM | POA: Insufficient documentation

## 2017-12-01 DIAGNOSIS — Z90710 Acquired absence of both cervix and uterus: Secondary | ICD-10-CM | POA: Insufficient documentation

## 2017-12-01 DIAGNOSIS — I10 Essential (primary) hypertension: Secondary | ICD-10-CM | POA: Insufficient documentation

## 2017-12-01 DIAGNOSIS — G8929 Other chronic pain: Secondary | ICD-10-CM | POA: Diagnosis not present

## 2017-12-01 DIAGNOSIS — Z7901 Long term (current) use of anticoagulants: Secondary | ICD-10-CM | POA: Insufficient documentation

## 2017-12-01 DIAGNOSIS — I480 Paroxysmal atrial fibrillation: Secondary | ICD-10-CM

## 2017-12-01 DIAGNOSIS — M5136 Other intervertebral disc degeneration, lumbar region: Secondary | ICD-10-CM | POA: Insufficient documentation

## 2017-12-01 DIAGNOSIS — Z8249 Family history of ischemic heart disease and other diseases of the circulatory system: Secondary | ICD-10-CM | POA: Diagnosis not present

## 2017-12-01 DIAGNOSIS — I350 Nonrheumatic aortic (valve) stenosis: Secondary | ICD-10-CM | POA: Insufficient documentation

## 2017-12-01 DIAGNOSIS — Z833 Family history of diabetes mellitus: Secondary | ICD-10-CM | POA: Diagnosis not present

## 2017-12-01 DIAGNOSIS — Z9889 Other specified postprocedural states: Secondary | ICD-10-CM | POA: Diagnosis not present

## 2017-12-01 DIAGNOSIS — Z7984 Long term (current) use of oral hypoglycemic drugs: Secondary | ICD-10-CM | POA: Insufficient documentation

## 2017-12-01 DIAGNOSIS — I481 Persistent atrial fibrillation: Secondary | ICD-10-CM | POA: Diagnosis present

## 2017-12-01 DIAGNOSIS — Z841 Family history of disorders of kidney and ureter: Secondary | ICD-10-CM | POA: Diagnosis not present

## 2017-12-01 DIAGNOSIS — Z888 Allergy status to other drugs, medicaments and biological substances status: Secondary | ICD-10-CM | POA: Diagnosis not present

## 2017-12-01 DIAGNOSIS — E119 Type 2 diabetes mellitus without complications: Secondary | ICD-10-CM | POA: Diagnosis not present

## 2017-12-01 LAB — BASIC METABOLIC PANEL
ANION GAP: 10 (ref 5–15)
BUN: 20 mg/dL (ref 6–20)
CALCIUM: 9.8 mg/dL (ref 8.9–10.3)
CO2: 29 mmol/L (ref 22–32)
Chloride: 101 mmol/L (ref 101–111)
Creatinine, Ser: 0.77 mg/dL (ref 0.44–1.00)
GFR calc Af Amer: >60 mL/min (ref >60)
GLUCOSE: 104 mg/dL — AB (ref 65–99)
Potassium: 4.1 mmol/L (ref 3.5–5.1)
Sodium: 140 mmol/L (ref 135–145)

## 2017-12-01 LAB — MAGNESIUM: Magnesium: 1.9 mg/dL (ref 1.7–2.4)

## 2017-12-01 NOTE — Progress Notes (Signed)
Patient ID: Julia Rose, female   DOB: 1945-12-15, 72 y.o.   MRN: [id number]     Primary Care Physician: Kaleen Mask, MD Referring Physician: Dr. Matt Holmes Julia Rose is a 72 y.o. 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 She was referred by Dr. Allyson Sabal for further evaluation 07/12/15.  The afib was paroxysmal at that time. She had been started on edoxaban 30 mg daily for a chadsvasc score of 4. She was aware of fatigue, shortness of breath and chest heaviness when in afib. 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   Returned to afib clinic 09/2016,  after seeing Dr. Allyson Sabal, and pt feeling she has been in persistent afib since  fall. For the most part she is minimally symptomatic. She walks with her daughter when the weather is nice 30 mins to an hour and can do so with minimal symptoms. She had a repeat ECHO with normal EF and moderate aortic stenosis which she was told was  stable. She was asked to be seen here to discuss antiarrythmic therapy. 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 Continues warfarin for a CHA2DS2VASc score of at least 3. She deferred any change in therapy due to up coming trip.   Returned to afib clinic 10/28/16, and wishes to further discuss restoring SR. When she went on her trip, she noticed how short of breath with activity she had become since the trip last year. People in her group were always waiting for her to catch up with them.She reports that when she makes the bed, she has to rest half way in between. Financial concerns would definitely play into what antiarrythmic she would be able to afford. After discussion of available antiarrythmic's, she was not ready to start AAD but was interested in pursuing cardioversion alone to see if she could return to SR and if her symptoms would improve in SR. She also has moderate aortic stenosis that may be playing a part in he symptoms, but by echo  Dr. Allyson Sabal told her this is stable.. She is on warfarin and has had 3 therapeutic warfarin levels checked  with PCP, 5/2 - 2.4, 5/10 - 2.1, 5/16 - 2.3 and INR pending this am with pre procedure labs. Pt aware of risk vrs benefit of procedure and wants to proceed.  F/u afib clinic 5/30 f/u cardioversion. The cardioversion was successful but unfortunately she did return to afib after 3 days. It was long enough for her to notice that she had more energy and less exertional dyspnea. This has made her more interested in pursing SR using an antiarrythmic which she was against before.  F/u 9/20 from hospitalization for sotalol, fortunately, pt did convert with drug and did not require cardioversion. She does have less shortness of breath in SR and more energy. She has done some outdoor walking and deep cleaning at her house which would not have been possible in the past year in afib. She is happy re results.  F/u in afib clinic, 1/15- She continues in SR and continues to experience less shortness of breath. She does have ongoing fatigue, but improved in SR.Marland Kitchen She has refused to have sleep study in the past.   F/u in afib clinic 09/21/17, at the request of Dr. Allyson Sabal, from finding afib at his recent office visit. Today pt is back in SR. She states that she had more stress lately as her  brother had open heart surgery in [facility] and she went up to stay with him. She has had her echo repeated recently and fairly stable with  mod aortic stenosis, diastolic dysfunction.  F/u afib clinic, 5/8, she reports very little breakthrough afib and it is short lived. Happy with sotalol management. Echo for moderate AS is pending 08/2018.  Today, she  with minimal  symptoms of palpitations, shortness of breath, no chest pain, orthopnea, PND, lower extremity edema, dizziness, presyncope, syncope, or neurologic sequela. The patient is tolerating medications without difficulties and is otherwise without complaint today.   Past Medical  History:  Diagnosis Date  . Arthritis    "hands" (04/06/2017)  . Asthma   . Atrial fibrillation (HCC)   . Atrial fibrillation with RVR (HCC)   . Chronic back pain    "from the DDD"  . DDD (degenerative disc disease), cervical   . DDD (degenerative disc disease), lumbar   . DDD (degenerative disc disease), thoracic    "not dx'd yet" (04/06/2017)  . Headache   . Heart murmur   . Hyperlipidemia    statin intolerant  . Hypertension   . Migraine    "thru the 1990s; probably stopped after my periods stopped/hysterectomy"  . Moderate aortic stenosis   . Right bundle branch block   . Seizures (HCC)    "when I was younger; none since the 24s" (04/06/2017)  . Type II diabetes mellitus (HCC)    Past Surgical History:  Procedure Laterality Date  . CARDIOVERSION N/A 12/17/2016   Procedure: CARDIOVERSION;  Surgeon: Elease Hashimoto, Deloris Ping, MD;  Location: [facility] ENDOSCOPY;  Service: Cardiovascular;  Laterality: N/A;  . DIAGNOSTIC LAPAROSCOPY     "paraovarian cyst removed"  . TONSILLECTOMY    . VAGINAL HYSTERECTOMY     "partial"    Current Outpatient Medications  Medication Sig Dispense Refill  . albuterol (PROVENTIL HFA;VENTOLIN HFA) 108 (90 Base) MCG/ACT inhaler Inhale 2 puffs into the lungs every 6 (six) hours as needed for wheezing or shortness of breath.    . Aspirin-Acetaminophen-Caffeine (EXCEDRIN MIGRAINE PO) Take 1-2 tablets by mouth daily as needed (for headache).     Marland Kitchen glimepiride (AMARYL) 4 MG tablet Take 2 mg by mouth daily with breakfast.    . hydrochlorothiazide (HYDRODIURIL) 25 MG tablet Take 25 mg by mouth daily.    . Loperamide HCl (ANTI-DIARRHEAL PO) Take 1 tablet by mouth as needed (for diarrhea).     . losartan (COZAAR) 100 MG tablet Take 50 mg by mouth at bedtime.  0  . lovastatin (MEVACOR) 20 MG tablet Take 20 mg by mouth daily at 6 PM.     . Magnesium 250 MG TABS Take 1 tablet by mouth daily.    . metFORMIN (GLUCOPHAGE) 1000 MG tablet Take 500-1,000 mg by mouth See admin  instructions. Take 1000 mg by mouth in the morning    . Nutritional Supplements (JUICE PLUS FIBRE PO) Take by mouth.    Marland Kitchen PHENobarbital (LUMINAL) 97.2 MG tablet Take 97.2 mg by mouth at bedtime.   4  . Simethicone (GAS RELIEF) 180 MG CAPS Take 180 mg by mouth as needed (for gas).     . sotalol (BETAPACE) 120 MG tablet TAKE 1 TABLET BY MOUTH EVERY 12 HOURS 60 tablet 6  . warfarin (COUMADIN) 5 MG tablet Take 1 tablet (5 mg total) by mouth daily. (Patient taking differently: Take 7.5-10 mg by mouth See admin instructions. Take 7.5 mg by mouth daily for one day then take 10  mg by mouth daily for 4 days, then start cycle over) 30 tablet 0  . nystatin-triamcinolone (MYCOLOG II) cream Apply 1 application topically 2 (two) times daily as needed (for yeast infection).     No current facility-administered medications for this encounter.     Allergies  Allergen Reactions  . Dilantin [Phenytoin] Hives and Other (See Comments)    Whelps all over for two weeks  . Statins Other (See Comments)    High dose statin - dizziness    Social History   Socioeconomic History  . Marital status: Married    Spouse name: Not on file  . Number of children: Not on file  . Years of education: Not on file  . Highest education level: Not on file  Occupational History  . Not on file  Social Needs  . Financial resource strain: Not on file  . Food insecurity:    Worry: Not on file    Inability: Not on file  . Transportation needs:    Medical: Not on file    Non-medical: Not on file  Tobacco Use  . Smoking status: Never Smoker  . Smokeless tobacco: Never Used  Substance and Sexual Activity  . Alcohol use: No  . Drug use: No  . Sexual activity: Not Currently  Lifestyle  . Physical activity:    Days per week: Not on file    Minutes per session: Not on file  . Stress: Not on file  Relationships  . Social connections:    Talks on phone: Not on file    Gets together: Not on file    Attends religious  service: Not on file    Active member of club or organization: Not on file    Attends meetings of clubs or organizations: Not on file    Relationship status: Not on file  . Intimate partner violence:    Fear of current or ex partner: Not on file    Emotionally abused: Not on file    Physically abused: Not on file    Forced sexual activity: Not on file  Other Topics Concern  . Not on file  Social History Narrative  . Not on file    Family History  Problem Relation Age of Onset  . Kidney disease Mother   . Diabetes Mother   . Dementia Mother   . Hypertension Mother     ROS- All systems are reviewed and negative except as per the HPI above  Physical Exam: Vitals:   12/01/17 1058  BP: 138/76  Pulse: 60  Weight: 199 lb 12.8 oz (90.6 kg)  Height:  (1.575 m)    GEN- The patient is well appearing, alert and oriented x 3 today.   Head- normocephalic, atraumatic Eyes-  Sclera clear, conjunctiva pink Ears- hearing intact Oropharynx- clear Neck- supple, no JVP, soft bruits, rt louder than left,? Possible radiation of systolic murmur Lymph- no cervical lymphadenopathy Lungs- Clear to ausculation bilaterally, normal work of breathing Heart- regular rate and rhythm, 2/6 systolic murmur rubs or gallops, PMI not laterally displaced GI- soft, NT, ND, + BS Extremities- no clubbing, cyanosis, or edema MS- no significant deformity or atrophy Skin- no rash or lesion Psych- euthymic mood, full affect Neuro- strength and sensation are intact  EKG- NSR, RBBB, pr int 166 ms, qrs int 140 ms, qtc 486 ms Epic records reviewed Echo-Study Conclusions 08/31/16-  Echo-08/31/17  Study Conclusions  - Left ventricle: The cavity size was normal. Wall thickness was  normal. Systolic function was normal. The estimated ejection   fraction was in the range of 60% to 65%. Wall motion was normal;   there were no regional wall motion abnormalities. There was a   reduced contribution of atrial  contraction to ventricular   filling, due to increased ventricular diastolic pressure or   atrial contractile dysfunction (e.g. recent conversion of atrial   fibrillation). Doppler parameters are consistent with restrictive   physiology, indicative of decreased left ventricular diastolic   compliance and/or increased left atrial pressure. - Aortic valve: There was moderate stenosis. - Left atrium: The atrium was mildly dilated. - Tricuspid valve: There was mild-moderate regurgitation directed   centrally. - Pulmonary arteries: Systolic pressure was mildly to moderately   increased. PA peak pressure: 46 mm Hg (S).  Impressions:  - Compared to February 2018 and January 2017, there is slight   worsening of aortic stenosis and substantial worsening of   diastolic LV function.    Assessment and Plan: 1 Persistent afib  Hospitalized for sotalol loading 03/2017 which was successful afib burden is low Continue sotalol 120 mg bid  2. HTN Stable  3. Chadsvasc score of at least 4 Continue warfarin  4.Moderate aortic stenosis Per Dr. Allyson Sabal, scheduled to have repeat echo 08/2018  F/u in afib clinic in 6 months    Lupita Leash [facility] [address] [place], [place] [address] [phone]

## 2018-03-16 ENCOUNTER — Encounter: Payer: Self-pay | Admitting: Cardiovascular Disease

## 2018-03-16 ENCOUNTER — Ambulatory Visit: Payer: Medicare Other | Admitting: Cardiovascular Disease

## 2018-03-16 DIAGNOSIS — I10 Essential (primary) hypertension: Secondary | ICD-10-CM

## 2018-03-16 DIAGNOSIS — E7800 Pure hypercholesterolemia, unspecified: Secondary | ICD-10-CM | POA: Diagnosis not present

## 2018-03-16 DIAGNOSIS — I350 Nonrheumatic aortic (valve) stenosis: Secondary | ICD-10-CM

## 2018-03-16 DIAGNOSIS — I480 Paroxysmal atrial fibrillation: Secondary | ICD-10-CM

## 2018-03-16 NOTE — Addendum Note (Signed)
Addended by: Freddi StarrMATHIS, Shanley Furlough W on: 03/16/2018 05:10 PM   Modules accepted: Orders

## 2018-03-16 NOTE — Assessment & Plan Note (Signed)
History of hyperlipidemia on lovastatin intolerant to statin therapy.  She would be a good candidate for Repatha.

## 2018-03-16 NOTE — Assessment & Plan Note (Signed)
History of PAF maintaining sinus rhythm on sotalol and Coumadin anticoagulation. 

## 2018-03-16 NOTE — Assessment & Plan Note (Signed)
History of essential hypertension her blood pressure measured at 126/74.  She is on hydrochlorothiazide and losartan.

## 2018-03-16 NOTE — Patient Instructions (Addendum)
Medication Instructions:   NO CHANGE  Testing/Procedures:  Your physician has requested that you have an echocardiogram. Echocardiography is a painless test that uses sound waves to create images of your heart. It provides your doctor with information about the size and shape of your heart and how well your heart's chambers and valves are working. This procedure takes approximately one hour. There are no restrictions for this procedure.SCHEDULE IN 6 MONTHS    Follow-Up:  Your physician wants you to follow-up in: 6 MONTHS WITH APP You will receive a reminder letter in the mail two months in advance. If you don't receive a letter, please call our office to schedule the follow-up appointment.   Your physician wants you to follow-up in: ONE YEAR WITH DR San MorelleBERRY You will receive a reminder letter in the mail two months in advance. If you don't receive a letter, please call our office to schedule the follow-up appointment.   If you need a refill on your cardiac medications before your next appointment, please call your pharmacy.

## 2018-03-16 NOTE — Progress Notes (Signed)
03/16/2018 Julia BouchardBarbara K Rose   09/08/1945  [id number]  Primary Physician Kaleen MaskElkins, Wilson Oliver, MD Primary Cardiologist: Runell GessJonathan J Darek Eifler MD Nicholes CalamityFACP, FACC, FAHA, [place]  HPI:  Julia BouchardBarbara K Rose is a 72 y.o.  moderately overweight married Caucasian female mother of 4 children, grandmother of a grandchildren whose husband Julia RuizJohn is also a patient of mine. She was referred by Dr. Jeannetta NapElkins for evaluation of recently recognized atrial fibrillation.I last sawher in the office  09/14/2017. Her cardiac risk factor profile is notable for treated hypertension, diabetes. She has hyperlipidemia intolerant to statin drugs. She's never had a heart stroke. She occasionally gets chest tightness. 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 She was recently found to be in A. Fib during a routine doctor's visit and was referred here for further evaluation. 8. Event monitor showed A. Fib with heart rates as high as 170. 2-D echo revealed normal LV systolic function with mild to moderate aortic stenosis and a Myoview stress test was low risk and nonischemic. Carotid Doppler showed no evidence of ICA stenosis. She was placed on oral anticoagulation. Today she is inatrial fibrillation with a controlled ventricular response on Coumadin anticoagulation. She underwent inpatient sotalol loading in the fall of last year and saw Rudi CocoDonna Carroll and the A. fib clinic last month at which time she was in sinus bradycardia. She is back in A. fib. I was unaware of this. Recent 2-D echocardiogram performed 08/31/17 revealed progression of her aortic stenosis with a valve area of 1.08 cm and a peak gradient of 36 mmHg. She did tell me that recently one of her brothers died of ischemic heart disease post valve replacement and bypass grafting and another brother had bypass grafting as well.  Since I saw her in the office 6 months ago she is remained stable.  She is had several breakthrough episodes of PAF briefly converting  back to sinus rhythm but these have been few and far between.  She has occasional atypical chest pain as well which does not sound ischemic.   Current Meds  Medication Sig  . albuterol (PROVENTIL HFA;VENTOLIN HFA) 108 (90 Base) MCG/ACT inhaler Inhale 2 puffs into the lungs every 6 (six) hours as needed for wheezing or shortness of breath.  . Aspirin-Acetaminophen-Caffeine (EXCEDRIN MIGRAINE PO) Take 1-2 tablets by mouth daily as needed (for headache).   Marland Kitchen. glimepiride (AMARYL) 4 MG tablet Take 2 mg by mouth daily with breakfast.  . hydrochlorothiazide (HYDRODIURIL) 25 MG tablet Take 25 mg by mouth daily.  . Loperamide HCl (ANTI-DIARRHEAL PO) Take 1 tablet by mouth as needed (for diarrhea).   . losartan (COZAAR) 100 MG tablet Take 50 mg by mouth at bedtime.  . lovastatin (MEVACOR) 20 MG tablet Take 20 mg by mouth daily at 6 PM.   . Magnesium 250 MG TABS Take 1 tablet by mouth daily.  . metFORMIN (GLUCOPHAGE) 1000 MG tablet Take 500-1,000 mg by mouth See admin instructions. Take 1000 mg by mouth in the morning  . Nutritional Supplements (JUICE PLUS FIBRE PO) Take by mouth.  . nystatin-triamcinolone (MYCOLOG II) cream Apply 1 application topically 2 (two) times daily as needed (for yeast infection).  Marland Kitchen. PHENobarbital (LUMINAL) 97.2 MG tablet Take 97.2 mg by mouth at bedtime.   . Simethicone (GAS RELIEF) 180 MG CAPS Take 180 mg by mouth as needed (for gas).   . sotalol (BETAPACE) 120 MG tablet TAKE 1 TABLET BY MOUTH EVERY 12 HOURS  . warfarin (COUMADIN) 5 MG tablet  Take 1 tablet (5 mg total) by mouth daily. (Patient taking differently: Take 7.5-10 mg by mouth See admin instructions. Take 7.5 mg by mouth daily for one day then take 10 mg by mouth daily for 4 days, then start cycle over)     Allergies  Allergen Reactions  . Dilantin [Phenytoin] Hives and Other (See Comments)    Whelps all over for two weeks  . Statins Other (See Comments)    High dose statin - dizziness    Social History    Socioeconomic History  . Marital status: Married    Spouse name: Not on file  . Number of children: Not on file  . Years of education: Not on file  . Highest education level: Not on file  Occupational History  . Not on file  Social Needs  . Financial resource strain: Not on file  . Food insecurity:    Worry: Not on file    Inability: Not on file  . Transportation needs:    Medical: Not on file    Non-medical: Not on file  Tobacco Use  . Smoking status: Never Smoker  . Smokeless tobacco: Never Used  Substance and Sexual Activity  . Alcohol use: No  . Drug use: No  . Sexual activity: Not Currently  Lifestyle  . Physical activity:    Days per week: Not on file    Minutes per session: Not on file  . Stress: Not on file  Relationships  . Social connections:    Talks on phone: Not on file    Gets together: Not on file    Attends religious service: Not on file    Active member of club or organization: Not on file    Attends meetings of clubs or organizations: Not on file    Relationship status: Not on file  . Intimate partner violence:    Fear of current or ex partner: Not on file    Emotionally abused: Not on file    Physically abused: Not on file    Forced sexual activity: Not on file  Other Topics Concern  . Not on file  Social History Narrative  . Not on file     Review of Systems: General: negative for chills, fever, night sweats or weight changes.  Cardiovascular: negative for chest pain, dyspnea on exertion, edema, orthopnea, palpitations, paroxysmal nocturnal dyspnea or shortness of breath Dermatological: negative for rash Respiratory: negative for cough or wheezing Urologic: negative for hematuria Abdominal: negative for nausea, vomiting, diarrhea, bright red blood per rectum, melena, or hematemesis Neurologic: negative for visual changes, syncope, or dizziness All other systems reviewed and are otherwise negative except as noted above.    Blood  pressure 126/74, pulse 95, height 5\' 2"  (1.575 m), weight 199 lb (90.3 kg).  General appearance: alert and no distress Neck: no adenopathy, no carotid bruit, no JVD, supple, symmetrical, trachea midline and thyroid not enlarged, symmetric, no tenderness/mass/nodules Lungs: clear to auscultation bilaterally Heart: regular rate and rhythm, S1, S2 normal, no murmur, click, rub or gallop Extremities: extremities normal, atraumatic, no cyanosis or edema Pulses: 2+ and symmetric Skin: Skin color, texture, turgor normal. No rashes or lesions Neurologic: Alert and oriented X 3, normal strength and tone. Normal symmetric reflexes. Normal coordination and gait  EKG not performed today  ASSESSMENT AND PLAN:   Atrial fibrillation (HCC) History of PAF maintaining sinus rhythm on sotalol and Coumadin anticoagulation.  Essential hypertension History of essential hypertension her blood pressure measured at 126/74.  She is on hydrochlorothiazide and losartan.  Hyperlipidemia History of hyperlipidemia on lovastatin intolerant to statin therapy.  She would be a good candidate for Repatha.  Aortic stenosis, moderate Moderate aortic stenosis on 2D echocardiogram performed 08/31/2017.  This will be repeated on an annual basis.  Currently, she has no symptoms of severe AS.       Runell Gess MD FACP,FACC,FAHA, [facility] 03/16/2018 5:01 PM

## 2018-03-16 NOTE — Assessment & Plan Note (Addendum)
Moderate aortic stenosis on 2D echocardiogram performed 08/31/2017.  This will be repeated on an annual basis.  Currently, she has no symptoms of severe AS.

## 2018-04-08 ENCOUNTER — Other Ambulatory Visit: Payer: Self-pay | Admitting: Family Medicine

## 2018-04-08 DIAGNOSIS — M549 Dorsalgia, unspecified: Secondary | ICD-10-CM

## 2018-04-13 ENCOUNTER — Other Ambulatory Visit: Payer: Medicare Other

## 2018-04-17 ENCOUNTER — Other Ambulatory Visit: Payer: Medicare Other

## 2018-04-17 ENCOUNTER — Ambulatory Visit
Admission: RE | Admit: 2018-04-17 | Discharge: 2018-04-17 | Disposition: A | Discharge status: Home / Self Care | Payer: Medicare Other | Source: Ambulatory Visit | Attending: Family Medicine | Admitting: Family Medicine

## 2018-04-17 DIAGNOSIS — M549 Dorsalgia, unspecified: Secondary | ICD-10-CM

## 2018-06-02 ENCOUNTER — Ambulatory Visit (HOSPITAL_COMMUNITY): Payer: Medicare Other | Admitting: Nurse Practitioner

## 2018-06-03 ENCOUNTER — Telehealth: Payer: Self-pay

## 2018-06-03 ENCOUNTER — Ambulatory Visit (HOSPITAL_COMMUNITY)
Admission: RE | Admit: 2018-06-03 | Discharge: 2018-06-03 | Disposition: A | Discharge status: Home / Self Care | Payer: Medicare Other | Source: Ambulatory Visit | Attending: Nurse Practitioner | Admitting: Nurse Practitioner

## 2018-06-03 ENCOUNTER — Encounter (HOSPITAL_COMMUNITY): Payer: Self-pay | Admitting: Nurse Practitioner

## 2018-06-03 VITALS — BP 118/74 | HR 58 | Ht 62.0 in | Wt 201.0 lb

## 2018-06-03 DIAGNOSIS — Z833 Family history of diabetes mellitus: Secondary | ICD-10-CM | POA: Insufficient documentation

## 2018-06-03 DIAGNOSIS — Z888 Allergy status to other drugs, medicaments and biological substances status: Secondary | ICD-10-CM | POA: Diagnosis not present

## 2018-06-03 DIAGNOSIS — Z79899 Other long term (current) drug therapy: Secondary | ICD-10-CM | POA: Diagnosis not present

## 2018-06-03 DIAGNOSIS — I480 Paroxysmal atrial fibrillation: Secondary | ICD-10-CM | POA: Diagnosis not present

## 2018-06-03 DIAGNOSIS — I4819 Other persistent atrial fibrillation: Secondary | ICD-10-CM | POA: Diagnosis not present

## 2018-06-03 DIAGNOSIS — Z7901 Long term (current) use of anticoagulants: Secondary | ICD-10-CM | POA: Insufficient documentation

## 2018-06-03 DIAGNOSIS — J45909 Unspecified asthma, uncomplicated: Secondary | ICD-10-CM | POA: Diagnosis not present

## 2018-06-03 DIAGNOSIS — Z7984 Long term (current) use of oral hypoglycemic drugs: Secondary | ICD-10-CM | POA: Diagnosis not present

## 2018-06-03 DIAGNOSIS — I10 Essential (primary) hypertension: Secondary | ICD-10-CM | POA: Insufficient documentation

## 2018-06-03 DIAGNOSIS — E785 Hyperlipidemia, unspecified: Secondary | ICD-10-CM | POA: Diagnosis not present

## 2018-06-03 DIAGNOSIS — E119 Type 2 diabetes mellitus without complications: Secondary | ICD-10-CM | POA: Diagnosis not present

## 2018-06-03 DIAGNOSIS — Z818 Family history of other mental and behavioral disorders: Secondary | ICD-10-CM | POA: Diagnosis not present

## 2018-06-03 DIAGNOSIS — Z841 Family history of disorders of kidney and ureter: Secondary | ICD-10-CM | POA: Diagnosis not present

## 2018-06-03 DIAGNOSIS — Z90710 Acquired absence of both cervix and uterus: Secondary | ICD-10-CM | POA: Insufficient documentation

## 2018-06-03 DIAGNOSIS — I350 Nonrheumatic aortic (valve) stenosis: Secondary | ICD-10-CM | POA: Diagnosis not present

## 2018-06-03 DIAGNOSIS — I4510 Unspecified right bundle-branch block: Secondary | ICD-10-CM | POA: Diagnosis not present

## 2018-06-03 DIAGNOSIS — Z8249 Family history of ischemic heart disease and other diseases of the circulatory system: Secondary | ICD-10-CM | POA: Diagnosis not present

## 2018-06-03 LAB — BASIC METABOLIC PANEL
Anion gap: 7 (ref 5–15)
BUN: 15 mg/dL (ref 8–23)
CO2: 30 mmol/L (ref 22–32)
CREATININE: 0.98 mg/dL (ref 0.44–1.00)
Calcium: 9.4 mg/dL (ref 8.9–10.3)
Chloride: 101 mmol/L (ref 98–111)
GFR calc Af Amer: >60 mL/min (ref >60)
GFR, EST NON AFRICAN AMERICAN: 56 mL/min — AB (ref >60)
Glucose, Bld: 121 mg/dL — ABNORMAL HIGH (ref 70–99)
Potassium: 4.1 mmol/L (ref 3.5–5.1)
SODIUM: 138 mmol/L (ref 135–145)

## 2018-06-03 LAB — MAGNESIUM: MAGNESIUM: 2 mg/dL (ref 1.7–2.4)

## 2018-06-03 NOTE — Progress Notes (Signed)
Patient ID: SCARLETT PORTLOCK, female   DOB: 1945-12-15, 72 y.o.   MRN: [id number]     Primary Care Physician: Kaleen Mask, MD Referring Physician: Dr. Matt Holmes Julia Rose is a 72 y.o. 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 She was referred by Dr. Allyson Sabal for further evaluation 07/12/15.  The afib was paroxysmal at that time. She had been started on edoxaban 30 mg daily for a chadsvasc score of 4. She was aware of fatigue, shortness of breath and chest heaviness when in afib. 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   Returned to afib clinic 09/2016,  after seeing Dr. Allyson Sabal, and pt feeling she has been in persistent afib since  fall. For the most part she is minimally symptomatic. She walks with her daughter when the weather is nice 30 mins to an hour and can do so with minimal symptoms. She had a repeat ECHO with normal EF and moderate aortic stenosis which she was told was  stable. She was asked to be seen here to discuss antiarrythmic therapy. 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 Continues warfarin for a CHA2DS2VASc score of at least 3. She deferred any change in therapy due to up coming trip.   Returned to afib clinic 10/28/16, and wishes to further discuss restoring SR. When she went on her trip, she noticed how short of breath with activity she had become since the trip last year. People in her group were always waiting for her to catch up with them.She reports that when she makes the bed, she has to rest half way in between. Financial concerns would definitely play into what antiarrythmic she would be able to afford. After discussion of available antiarrythmic's, she was not ready to start AAD but was interested in pursuing cardioversion alone to see if she could return to SR and if her symptoms would improve in SR. She also has moderate aortic stenosis that may be playing a part in he symptoms, but by echo  Dr. Allyson Sabal told her this is stable.. She is on warfarin and has had 3 therapeutic warfarin levels checked  with PCP, 5/2 - 2.4, 5/10 - 2.1, 5/16 - 2.3 and INR pending this am with pre procedure labs. Pt aware of risk vrs benefit of procedure and wants to proceed.  F/u afib clinic 5/30 f/u cardioversion. The cardioversion was successful but unfortunately she did return to afib after 3 days. It was long enough for her to notice that she had more energy and less exertional dyspnea. This has made her more interested in pursing SR using an antiarrythmic which she was against before.  F/u 9/20 from hospitalization for sotalol, fortunately, pt did convert with drug and did not require cardioversion. She does have less shortness of breath in SR and more energy. She has done some outdoor walking and deep cleaning at her house which would not have been possible in the past year in afib. She is happy re results.  F/u in afib clinic, 1/15- She continues in SR and continues to experience less shortness of breath. She does have ongoing fatigue, but improved in SR.Marland Kitchen She has refused to have sleep study in the past.   F/u in afib clinic 09/21/17, at the request of Dr. Allyson Sabal, from finding afib at his recent office visit. Today pt is back in SR. She states that she had more stress lately as her  brother had open heart surgery in [facility] and she went up to stay with him. She has had her echo repeated recently and fairly stable with  mod aortic stenosis, diastolic dysfunction.  F/u afib clinic, 5/8, she reports very little breakthrough afib and it is short lived. Happy with sotalol management. Echo for moderate AS is pending 08/2018.  F/u in fib clinic, 11/8. She is in SR and feels well. No issues with meds. Has had to stop walking for arthritic changes in her back and hips. She is interested in the [employer]  [employer] 3 month supervised exercise program.  Today, she  with minimal  symptoms of palpitations, shortness of breath, no chest  pain, orthopnea, PND, lower extremity edema, dizziness, presyncope, syncope, or neurologic sequela. The patient is tolerating medications without difficulties and is otherwise without complaint today.   Past Medical History:  Diagnosis Date  . Arthritis    "hands" (04/06/2017)  . Asthma   . Atrial fibrillation (HCC)   . Atrial fibrillation with RVR (HCC)   . Chronic back pain    "from the DDD"  . DDD (degenerative disc disease), cervical   . DDD (degenerative disc disease), lumbar   . DDD (degenerative disc disease), thoracic    "not dx'd yet" (04/06/2017)  . Headache   . Heart murmur   . Hyperlipidemia    statin intolerant  . Hypertension   . Migraine    "thru the 1990s; probably stopped after my periods stopped/hysterectomy"  . Moderate aortic stenosis   . Right bundle branch block   . Seizures (HCC)    "when I was younger; none since the 75s" (04/06/2017)  . Type II diabetes mellitus (HCC)    Past Surgical History:  Procedure Laterality Date  . CARDIOVERSION N/A 12/17/2016   Procedure: CARDIOVERSION;  Surgeon: Elease Hashimoto, Deloris Ping, MD;  Location: [facility] END[facility]COPY;  Service: Cardiovascular;  Laterality: N/A;  . DIAGNOSTIC LAPAROSCOPY     "paraovarian cyst removed"  . TONSILLECTOMY    . VAGINAL HYSTERECTOMY     "partial"    Current Outpatient Medications  Medication Sig Dispense Refill  . albuterol (PROVENTIL HFA;VENTOLIN HFA) 108 (90 Base) MCG/ACT inhaler Inhale 2 puffs into the lungs every 6 (six) hours as needed for wheezing or shortness of breath.    . Aspirin-Acetaminophen-Caffeine (EXCEDRIN MIGRAINE PO) Take 1-2 tablets by mouth daily as needed (for headache).     Marland Kitchen glimepiride (AMARYL) 4 MG tablet Take 2 mg by mouth daily with breakfast.    . hydrochlorothiazide (HYDRODIURIL) 25 MG tablet Take 25 mg by mouth daily.    . Loperamide HCl (ANTI-DIARRHEAL PO) Take 1 tablet by mouth as needed (for diarrhea).     . losartan (COZAAR) 100 MG tablet Take 50 mg by mouth at bedtime.   0  . lovastatin (MEVACOR) 20 MG tablet Take 20 mg by mouth daily at 6 PM.     . Magnesium 250 MG TABS Take 1 tablet by mouth daily.    . metFORMIN (GLUCOPHAGE) 1000 MG tablet Take 500-1,000 mg by mouth See admin instructions. Take 1000 mg by mouth in the morning    . Nutritional Supplements (JUICE PLUS FIBRE PO) Take by mouth.    . nystatin-triamcinolone (MYCOLOG II) cream Apply 1 application topically 2 (two) times daily as needed (for yeast infection).    Marland Kitchen PHENobarbital (LUMINAL) 97.2 MG tablet Take 97.2 mg by mouth at bedtime.   4  . Simethicone (GAS RELIEF) 180 MG CAPS Take 180 mg by mouth  as needed (for gas).     . sotalol (BETAPACE) 120 MG tablet TAKE 1 TABLET BY MOUTH EVERY 12 HOURS 60 tablet 6  . warfarin (COUMADIN) 5 MG tablet Take 1 tablet (5 mg total) by mouth daily. (Patient taking differently: Take 7.5-10 mg by mouth See admin instructions. Take 7.5 mg by mouth daily for one day then take 10 mg by mouth daily for 4 days, then start cycle over) 30 tablet 0   No current facility-administered medications for this encounter.     Allergies  Allergen Reactions  . Dilantin [Phenytoin] Hives and Other (See Comments)    Whelps all over for two weeks  . Statins Other (See Comments)    High dose statin - dizziness    Social History   Socioeconomic History  . Marital status: Married    Spouse name: Not on file  . Number of children: Not on file  . Years of education: Not on file  . Highest education level: Not on file  Occupational History  . Not on file  Social Needs  . Financial resource strain: Not on file  . Food insecurity:    Worry: Not on file    Inability: Not on file  . Transportation needs:    Medical: Not on file    Non-medical: Not on file  Tobacco Use  . Smoking status: Never Smoker  . Smokeless tobacco: Never Used  Substance and Sexual Activity  . Alcohol use: No  . Drug use: No  . Sexual activity: Not Currently  Lifestyle  . Physical activity:    Days  per week: Not on file    Minutes per session: Not on file  . Stress: Not on file  Relationships  . Social connections:    Talks on phone: Not on file    Gets together: Not on file    Attends religious service: Not on file    Active member of club or organization: Not on file    Attends meetings of clubs or organizations: Not on file    Relationship status: Not on file  . Intimate partner violence:    Fear of current or ex partner: Not on file    Emotionally abused: Not on file    Physically abused: Not on file    Forced sexual activity: Not on file  Other Topics Concern  . Not on file  Social History Narrative  . Not on file    Family History  Problem Relation Age of Onset  . Kidney disease Mother   . Diabetes Mother   . Dementia Mother   . Hypertension Mother     ROS- All systems are reviewed and negative except as per the HPI above  Physical Exam: Vitals:   06/03/18 0929  BP: 118/74  Pulse: (!) 58  Weight: 91.2 kg  Height: 5\' 2"  (1.575 m)    GEN- The patient is well appearing, alert and oriented x 3 today.   Head- normocephalic, atraumatic Eyes-  Sclera clear, conjunctiva pink Ears- hearing intact Oropharynx- clear Neck- supple, no JVP, soft bruits, rt louder than left,? Possible radiation of systolic murmur Lymph- no cervical lymphadenopathy Lungs- Clear to ausculation bilaterally, normal work of breathing Heart- regular rate and rhythm, 2/6 systolic murmur rubs or gallops, PMI not laterally displaced GI- soft, NT, ND, + BS Extremities- no clubbing, cyanosis, or edema MS- no significant deformity or atrophy Skin- no rash or lesion Psych- euthymic mood, full affect Neuro- strength and sensation are intact  EKG- sinus brady at 58 bpm, pr int 152 ms, qrs int 142 ms, qtc 465 ms Epic records reviewed  Echo-08/31/17  Study Conclusions  - Left ventricle: The cavity size was normal. Wall thickness was   normal. Systolic function was normal. The estimated  ejection   fraction was in the range of 60% to 65%. Wall motion was normal;   there were no regional wall motion abnormalities. There was a   reduced contribution of atrial contraction to ventricular   filling, due to increased ventricular diastolic pressure or   atrial contractile dysfunction (e.g. recent conversion of atrial   fibrillation). Doppler parameters are consistent with restrictive   physiology, indicative of decreased left ventricular diastolic   compliance and/or increased left atrial pressure. - Aortic valve: There was moderate stenosis. - Left atrium: The atrium was mildly dilated. - Tricuspid valve: There was mild-moderate regurgitation directed   centrally. - Pulmonary arteries: Systolic pressure was mildly to moderately   increased. PA peak pressure: 46 mm Hg (S).  Impressions:  - Compared to February 2018 and January 2017, there is slight   worsening of aortic stenosis and substantial worsening of   diastolic LV function.    Assessment and Plan: 1 Persistent afib  Maintaining SR with sotalol 120 mg bid afib burden is very low Bmet/mag today  2. HTN Stable  3. Chadsvasc score of at least 4 Continue warfarin  4.Moderate aortic stenosis Per Dr. Allyson Sabal, scheduled to have repeat echo 08/2018  F/u in afib clinic in 6 months    [facility] [address] [place], [place] [address] [phone]

## 2018-06-03 NOTE — Telephone Encounter (Signed)
Spoke to Ms. Godley who is interested in participating in the PREP in the new year.  We will be speaking again in December to make an intake appointment.

## 2018-06-13 ENCOUNTER — Other Ambulatory Visit: Payer: Self-pay | Admitting: Cardiovascular Disease

## 2018-06-14 NOTE — Telephone Encounter (Signed)
Rx request sent to pharmacy.  

## 2018-07-04 NOTE — Progress Notes (Signed)
Spoke to Julia Rose about the 12[facility] starting Jan 7[facility].  We will meet for intake on 12/17 at 10am [facility]

## 2018-08-03 NOTE — Progress Notes (Signed)
[facility] PREP Progress Report   Patient Details  Name: Julia Rose MRN: [id number] Date of Birth: 06/24/1946 Age: 73 y.o. PCP: Kaleen Mask, MD  Vitals:   08/02/18 1133  BP: 124/72  Pulse: 68  Resp: 18  Weight: 199 lb (90.3 kg)     [facility] Eval - 08/03/18 1100      Referral    Referring Provider  AFIB clinic    Reason for referral  Obesitity/Overweight;Inactivity;Orthopedic;Diabetes    Program Start Date  08/02/18      Information for Trainer   Goals  "to strengthen heart, body, legs, to lose 12lbs"    Current Exercise  Walking in house    Orthopedic Concerns  herniated lumbar discs,    Pertinent Medical History  afib, dm    Current Barriers  none    Restrictions/Precautions  Diabetic snack before exercise;Assistive device    Medications that affect exercise  Beta blocker      Mobility and Daily Activities   I find it easy to walk up or down two or more flights of stairs.  2    I have no trouble taking out the trash.  3    I do housework such as vacuuming and dusting on my own without difficulty.  1    I can easily lift a gallon of milk (8lbs).  3    I can easily walk a mile.  1    I have no trouble reaching into high cupboards or reaching down to pick up something from the floor.  3    I do not have trouble doing out-door work such as Loss adjuster, chartered, raking leaves, or gardening.  1      Mobility and Daily Activities   I feel younger than my age.  4    I feel independent.  4    I feel energetic.  2    I live an active life.   2    I feel strong.  3    I feel healthy.  3    I feel active as other people my age.  2      How fit and strong are you.   Fit and Strong Total Score  34      Past Medical History:  Diagnosis Date  . Arthritis    "hands" (04/06/2017)  . Asthma   . Atrial fibrillation (HCC)   . Atrial fibrillation with RVR (HCC)   . Chronic back pain    "from the DDD"  . DDD (degenerative disc disease), cervical   . DDD  (degenerative disc disease), lumbar   . DDD (degenerative disc disease), thoracic    "not dx'd yet" (04/06/2017)  . Headache   . Heart murmur   . Hyperlipidemia    statin intolerant  . Hypertension   . Migraine    "thru the 1990s; probably stopped after my periods stopped/hysterectomy"  . Moderate aortic stenosis   . Right bundle branch block   . Seizures (HCC)    "when I was younger; none since the 61s" (04/06/2017)  . Type II diabetes mellitus (HCC)    Past Surgical History:  Procedure Laterality Date  . CARDIOVERSION N/A 12/17/2016   Procedure: CARDIOVERSION;  Surgeon: Elease Hashimoto, Deloris Ping, MD;  [facility] ENDOSCOPY;  Service: Cardiovascular;  Laterality: N/A;  . DIAGNOSTIC LAPAROSCOPY     "paraovarian cyst removed"  . TONSILLECTOMY    . VAGINAL HYSTERECTOMY     "partial"  Social History   Tobacco Use  Smoking Status Never Smoker  Smokeless Tobacco Never Used     Ms. Kosek started her twice weekly, 12-week PREP at the [facility].     [name] 08/03/2018, 11:38 AM

## 2018-08-17 NOTE — Progress Notes (Signed)
[facility] PREP Weekly Session   Patient Details  Name: Julia Rose MRN: [id number] Date of Birth: 03-31-1946 Age: 73 y.o. PCP: Kaleen Mask, MD  Vitals:   08/09/18 1558 08/17/18 1557  Weight: 202 lb 12.8 oz (92 kg) 198 lb (89.8 kg)    [name] - 08/17/18 1500      Weekly Session   Topic Discussed  Healthy eating tips    Minutes exercised this week  285 minutes   85cardio/200strength for last 2-weeks   Classes attended to date  2      Things you are grateful for: Being alive, family, new friends, mobility help.   Nutrition celebrations:"omited sugar, less carbs, eating more salads   [name] 08/17/2018, 4:01 PM

## 2018-08-24 NOTE — Progress Notes (Signed)
[facility] PREP Weekly Session   Patient Details  Name: Julia Rose MRN: [id number] Date of Birth: [birth year] Age: 73 y.o. PCP: Kaleen Mask, MD  Vitals:   08/23/18 2156  Weight: 202 lb 3.2 oz (91.7 kg)    [facility] Weekly seesion - 08/23/18 2157      Weekly Session   Topic Discussed  Other ways to be active    Minutes exercised this week  281 minutes   41cardio/240strength     Things you are grateful for:"This class, exercise helping"  [name] 08/24/2018, 9:57 PM

## 2018-08-31 NOTE — Progress Notes (Signed)
[facility] PREP Progress Report   Patient Details  Name: Julia Rose MRN: [id number] Date of Birth: [birth year] Age: 73 y.o. PCP: Kaleen Mask, MD  Vitals:   08/30/18 1931  Weight: 199 lb 12.8 oz (90.6 kg)      Past Medical History:  Diagnosis Date  . Arthritis    "hands" (04/06/2017)  . Asthma   . Atrial fibrillation (HCC)   . Atrial fibrillation with RVR (HCC)   . Chronic back pain    "from the DDD"  . DDD (degenerative disc disease), cervical   . DDD (degenerative disc disease), lumbar   . DDD (degenerative disc disease), thoracic    "not dx'd yet" (04/06/2017)  . Headache   . Heart murmur   . Hyperlipidemia    statin intolerant  . Hypertension   . Migraine    "thru the 1990s; probably stopped after my periods stopped/hysterectomy"  . Moderate aortic stenosis   . Right bundle branch block   . Seizures (HCC)    "when I was younger; none since the 86s" (04/06/2017)  . Type II diabetes mellitus (HCC)    Past Surgical History:  Procedure Laterality Date  . CARDIOVERSION N/A 12/17/2016   Procedure: CARDIOVERSION;  Surgeon: Elease Hashimoto, Deloris Ping, MD;  Location: [facility] ENDOSCOPY;  Service: Cardiovascular;  Laterality: N/A;  . DIAGNOSTIC LAPAROSCOPY     "paraovarian cyst removed"  . TONSILLECTOMY    . VAGINAL HYSTERECTOMY     "partial"   Social History   Tobacco Use  Smoking Status Never Smoker  Smokeless Tobacco Never Used     "Thankful for yesterday's sunrise.  Thankful for losing 3lbs (yeah!!!) and walked 1/4 mile"   [name] 08/31/2018, 7:32 PM

## 2018-09-06 NOTE — Progress Notes (Signed)
[facility] PREP Weekly Session   Patient Details  Name: Julia Rose MRN: [id number] Date of Birth: [birth year] Age: 73 y.o. PCP: Kaleen Mask, MD  Vitals:   09/06/18 2039  Weight: 201 lb 9.6 oz (91.4 kg)    [name] - 09/06/18 2000      Weekly Session   Topic Discussed  Stress management and problem solving    Minutes exercised this week  --   walked 1/25miles     Fun things you did since last meeting:"went to lunch w/daughter and 3 grands" Barriers:"stress"  [name] 09/06/2018, 8:40 PM

## 2018-09-14 ENCOUNTER — Ambulatory Visit (HOSPITAL_COMMUNITY): Payer: Medicare Other | Attending: Cardiology

## 2018-09-14 ENCOUNTER — Encounter (INDEPENDENT_AMBULATORY_CARE_PROVIDER_SITE_OTHER): Payer: Self-pay

## 2018-09-14 DIAGNOSIS — I350 Nonrheumatic aortic (valve) stenosis: Secondary | ICD-10-CM | POA: Diagnosis present

## 2018-09-14 NOTE — Progress Notes (Signed)
[facility] PREP Weekly Session   Patient Details  Name: Julia Rose MRN: [id number] Date of Birth: 02-26-1946 Age: 73 y.o. PCP: Kaleen Mask, MD  There were no vitals filed for this visit.  [name] - 09/14/18 1600      Weekly Session   Topic Discussed  Importance of resistance training    Minutes exercised this week  85 minutes   30cardio/63flecibility     Things you are grateful for:"2 youngest grands saved and will be baptized first of March"  [name] 09/14/2018, 4:43 PM

## 2018-09-15 ENCOUNTER — Encounter: Payer: Self-pay | Admitting: *Deleted

## 2018-09-16 ENCOUNTER — Telehealth: Payer: Self-pay

## 2018-09-16 DIAGNOSIS — I350 Nonrheumatic aortic (valve) stenosis: Secondary | ICD-10-CM

## 2018-09-16 NOTE — Telephone Encounter (Signed)
Pt aware of results and to repeat echo in 12 months. Routed to scheduling

## 2018-10-02 NOTE — Progress Notes (Signed)
[facility] PREP Weekly Session   Patient Details  Name: Julia Rose MRN: [id number] Date of Birth: 1945/10/22 Age: 73 y.o. PCP: Kaleen Mask, MD  There were no vitals filed for this visit.  [facility] Weekly seesion - 09/20/18 2136      Weekly Session   Topic Discussed  Hitting roadblocks;Restaurant Eating    Minutes exercised this week  150 minutes   30cardio/180flexibility     Struggles:"Weight loss"  [name] 10/02/2018, 9:37 PM

## 2018-10-04 ENCOUNTER — Ambulatory Visit: Payer: Medicare Other | Admitting: Cardiology

## 2018-10-05 ENCOUNTER — Other Ambulatory Visit: Payer: Self-pay

## 2018-10-05 ENCOUNTER — Ambulatory Visit: Payer: Medicare Other | Admitting: Cardiology

## 2018-10-05 ENCOUNTER — Encounter: Payer: Self-pay | Admitting: Cardiology

## 2018-10-05 VITALS — BP 130/60 | HR 69 | Ht 62.0 in | Wt 201.0 lb

## 2018-10-05 DIAGNOSIS — I350 Nonrheumatic aortic (valve) stenosis: Secondary | ICD-10-CM

## 2018-10-05 DIAGNOSIS — I4510 Unspecified right bundle-branch block: Secondary | ICD-10-CM

## 2018-10-05 DIAGNOSIS — I10 Essential (primary) hypertension: Secondary | ICD-10-CM

## 2018-10-05 DIAGNOSIS — I480 Paroxysmal atrial fibrillation: Secondary | ICD-10-CM | POA: Diagnosis not present

## 2018-10-05 DIAGNOSIS — E119 Type 2 diabetes mellitus without complications: Secondary | ICD-10-CM

## 2018-10-05 DIAGNOSIS — Z7901 Long term (current) use of anticoagulants: Secondary | ICD-10-CM | POA: Diagnosis not present

## 2018-10-05 DIAGNOSIS — E785 Hyperlipidemia, unspecified: Secondary | ICD-10-CM

## 2018-10-05 DIAGNOSIS — M47816 Spondylosis without myelopathy or radiculopathy, lumbar region: Secondary | ICD-10-CM | POA: Insufficient documentation

## 2018-10-05 DIAGNOSIS — I208 Other forms of angina pectoris: Secondary | ICD-10-CM

## 2018-10-05 DIAGNOSIS — I2089 Other forms of angina pectoris: Secondary | ICD-10-CM

## 2018-10-05 NOTE — Assessment & Plan Note (Signed)
Chronic. 

## 2018-10-05 NOTE — Assessment & Plan Note (Signed)
On Glucophage and Amaryl

## 2018-10-05 NOTE — Assessment & Plan Note (Signed)
Echo Feb 2020- mean gradient 25 mmHg,  AVA 1.0

## 2018-10-05 NOTE — Assessment & Plan Note (Signed)
Controlled.  

## 2018-10-05 NOTE — Assessment & Plan Note (Signed)
Followed in AF clinic-on Betapace 120 mg BID

## 2018-10-05 NOTE — Assessment & Plan Note (Signed)
This has limited her mobility-she does report some improvement with PT

## 2018-10-05 NOTE — Progress Notes (Signed)
10/05/2018 Julia Rose   January 29, 1946  [id number]  Primary Physician Kaleen Mask, MD Primary Cardiologist: Dr Allyson Sabal  HPI: The patient is a pleasant 73 year old female followed by Dr Allyson Sabal with a history of PAF, moderate aortic stenosis, treated hypertension, non-insulin-dependent diabetes, and dyslipidemia with statin intolerance.  She has been followed in the atrial fibrillation clinic and has been in normal sinus rhythm on Betapace.  She is in the office today for routine follow-up.  She saw Rudi Coco in November, she is stable from her PAF standpoint and can see her on a as needed basis.  Echocardiogram 09/14/2018 shows an ejection fraction of 55 to 60%.  The aortic valve is tricuspid.  There is moderate stenosis with a mean gradient of 25 mmHg and an aortic valve area of 1.0 cm.  Symptomatically the patient has occasional chest pain, is not clear this is angina.  She is working in a program at [employer] and doing physical therapy.  She says she can walk about a mile a day, her limiting factor is pain in her legs and hips from her back.   Current Outpatient Medications  Medication Sig Dispense Refill  . albuterol (PROVENTIL HFA;VENTOLIN HFA) 108 (90 Base) MCG/ACT inhaler Inhale 2 puffs into the lungs every 6 (six) hours as needed for wheezing or shortness of breath.    . Aspirin-Acetaminophen-Caffeine (EXCEDRIN MIGRAINE PO) Take 1-2 tablets by mouth daily as needed (for headache).     . hydrochlorothiazide (HYDRODIURIL) 25 MG tablet Take 25 mg by mouth daily.    . Loperamide HCl (ANTI-DIARRHEAL PO) Take 1 tablet by mouth as needed (for diarrhea).     . losartan (COZAAR) 100 MG tablet Take 50 mg by mouth at bedtime.  0  . lovastatin (MEVACOR) 20 MG tablet Take 20 mg by mouth daily at 6 PM.     . Magnesium 250 MG TABS Take 1 tablet by mouth daily.    . metFORMIN (GLUCOPHAGE) 1000 MG tablet Take 500-1,000 mg by mouth See admin instructions. Take 1000 mg by mouth in the morning     . Nutritional Supplements (JUICE PLUS FIBRE PO) Take by mouth.    . nystatin-triamcinolone (MYCOLOG II) cream Apply 1 application topically 2 (two) times daily as needed (for yeast infection).    Marland Kitchen PHENobarbital (LUMINAL) 97.2 MG tablet Take 97.2 mg by mouth at bedtime.   4  . Simethicone (GAS RELIEF) 180 MG CAPS Take 180 mg by mouth as needed (for gas).     . sotalol (BETAPACE) 120 MG tablet TAKE 1 TABLET BY MOUTH EVERY 12 HOURS 60 tablet 6  . warfarin (COUMADIN) 5 MG tablet Take 1 tablet (5 mg total) by mouth daily. (Patient taking differently: Take 7.5-10 mg by mouth See admin instructions. Take 7.5 mg by mouth daily for one day then take 10 mg by mouth daily for 4 days, then start cycle over) 30 tablet 0   No current facility-administered medications for this visit.     Allergies  Allergen Reactions  . Dilantin [Phenytoin] Hives and Other (See Comments)    Whelps all over for two weeks  . Statins Other (See Comments)    High dose statin - dizziness    Past Medical History:  Diagnosis Date  . Arthritis    "hands" (04/06/2017)  . Asthma   . Atrial fibrillation (HCC)   . Atrial fibrillation with RVR (HCC)   . Chronic back pain    "from the DDD"  .  DDD (degenerative disc disease), cervical   . DDD (degenerative disc disease), lumbar   . DDD (degenerative disc disease), thoracic    "not dx'd yet" (04/06/2017)  . Headache   . Heart murmur   . Hyperlipidemia    statin intolerant  . Hypertension   . Migraine    "thru the 1990s; probably stopped after my periods stopped/hysterectomy"  . Moderate aortic stenosis   . Right bundle branch block   . Seizures (HCC)    "when I was younger; none since the 91980s" (04/06/2017)  . Type II diabetes mellitus (HCC)     Social History   Socioeconomic History  . Marital status: Married    Spouse name: Not on file  . Number of children: Not on file  . Years of education: Not on file  . Highest education level: Not on file  Occupational  History  . Not on file  Social Needs  . Financial resource strain: Not on file  . Food insecurity:    Worry: Not on file    Inability: Not on file  . Transportation needs:    Medical: Not on file    Non-medical: Not on file  Tobacco Use  . Smoking status: Never Smoker  . Smokeless tobacco: Never Used  Substance and Sexual Activity  . Alcohol use: No  . Drug use: No  . Sexual activity: Not Currently  Lifestyle  . Physical activity:    Days per week: Not on file    Minutes per session: Not on file  . Stress: Not on file  Relationships  . Social connections:    Talks on phone: Not on file    Gets together: Not on file    Attends religious service: Not on file    Active member of club or organization: Not on file    Attends meetings of clubs or organizations: Not on file    Relationship status: Not on file  . Intimate partner violence:    Fear of current or ex partner: Not on file    Emotionally abused: Not on file    Physically abused: Not on file    Forced sexual activity: Not on file  Other Topics Concern  . Not on file  Social History Narrative  . Not on file     Family History  Problem Relation Age of Onset  . Kidney disease Mother   . Diabetes Mother   . Dementia Mother   . Hypertension Mother      Review of Systems: General: negative for chills, fever, night sweats or weight changes.  Cardiovascular: negative for chest pain, dyspnea on exertion, edema, orthopnea, palpitations, paroxysmal nocturnal dyspnea or shortness of breath Dermatological: negative for rash Respiratory: negative for cough or wheezing Urologic: negative for hematuria Abdominal: some GI issues with metformin-negative for nausea, vomiting, diarrhea, bright red blood per rectum, melena, or hematemesis Neurologic: negative for visual changes, syncope, or dizziness All other systems reviewed and are otherwise negative except as noted above.    Blood pressure 130/60, pulse 69, height 5'  2" (1.575 m), weight 201 lb (91.2 kg).  General appearance: alert, cooperative, no distress and moderately obese Neck: no JVD and transmitted murmur Lungs: clear to auscultation bilaterally Heart: regular rate and rhythm and 2/6 sytsolic murmur AOV, LSB, preserved S2 Extremities: no edema Skin: Skin color, texture, turgor normal. No rashes or lesions Neurologic: Grossly normal  EKG NSR, 70- RBBB, PVC QTc 483  ASSESSMENT AND PLAN:   PAF (paroxysmal atrial  fibrillation) (HCC) Followed in AF clinic-on Betapace 120 mg BID  Chronic anticoagulation CHADS VASC=4- she is on Coumadin  Aortic stenosis, moderate Echo Feb 2020- mean gradient 25 mmHg,  AVA 1.0  Non-insulin treated type 2 diabetes mellitus (HCC) On Glucophage and Amaryl  Essential hypertension Controlled  Right bundle branch block Chronic  DJD (degenerative joint disease), lumbar This has limited her mobility-she does report some improvement with PT   PLAN   continue her current medical treatment.  She will need an echo in February 2021.  She should follow-up with Dr. Allyson Sabal in 6 months.  She can see Rudi Coco as needed.  I encouraged her to continue with her physical therapy and exercise program.  We discussed the signs and symptoms of progressive aortic stenosis, she will let us know if she has any significant exertional chest pain, shortness of breath, syncope or near syncope.  I did order labs today including BMP CBC and Mg++.   Corine Shelter PA-C 10/05/2018 2:30 PM

## 2018-10-05 NOTE — Assessment & Plan Note (Signed)
CHADS VASC=4- she is on Coumadin 

## 2018-10-05 NOTE — Patient Instructions (Signed)
Medication Instructions:  Your physician recommends that you continue on your current medications as directed. Please refer to the Current Medication list given to you today. If you need a refill on your cardiac medications before your next appointment, please call your pharmacy.   Lab work: Your physician recommends that you return for lab work in: TODAY-BMET, CBC If you have labs (blood work) drawn today and your tests are completely normal, you will receive your results only by: Marland Kitchen MyChart Message (if you have MyChart) OR . A paper copy in the mail If you have any lab test that is abnormal or we need to change your treatment, we will call you to review the results.  Testing/Procedures: NONE   Follow-Up: At [facility], you and your health needs are our priority.  As part of our continuing mission to provide you with exceptional heart care, we have created designated Provider Care Teams.  These Care Teams include your primary Cardiologist (physician) and Advanced Practice Providers (APPs -  Physician Assistants and Nurse Practitioners) who all work together to provide you with the care you need, when you need it. You will need a follow up appointment in 6 months.  Please call our office 2 months in advance to schedule this appointment.  You may see Nanetta Batty, MD or one of the following Advanced Practice Providers on your designated Care Team:   Corine Shelter, PA-C Judy Pimple, [place] . Marjie Skiff, PA-C  Any Other Special Instructions Will Be Listed Below (If Applicable).

## 2018-10-06 LAB — CBC
Hematocrit: 39.7 % (ref 34.0–46.6)
Hemoglobin: 13.4 g/dL (ref 11.1–15.9)
MCH: 31.1 pg (ref 26.6–33.0)
MCHC: 33.8 g/dL (ref 31.5–35.7)
MCV: 92 fL (ref 79–97)
Platelets: 250 10*3/uL (ref 150–450)
RBC: 4.31 x10E6/uL (ref 3.77–5.28)
RDW: 12.2 % (ref 11.7–15.4)
WBC: 7.9 10*3/uL (ref 3.4–10.8)

## 2018-10-06 LAB — BASIC METABOLIC PANEL
BUN/Creatinine Ratio: 18 (ref 12–28)
BUN: 18 mg/dL (ref 8–27)
CO2: 27 mmol/L (ref 20–29)
Calcium: 9.6 mg/dL (ref 8.7–10.3)
Chloride: 99 mmol/L (ref 96–106)
Creatinine, Ser: 0.99 mg/dL (ref 0.57–1.00)
GFR calc Af Amer: 66 mL/min/{1.73_m2} (ref >59)
GFR calc non Af Amer: 57 mL/min/{1.73_m2} — ABNORMAL LOW (ref >59)
Glucose: 194 mg/dL — ABNORMAL HIGH (ref 65–99)
Potassium: 4.5 mmol/L (ref 3.5–5.2)
Sodium: 141 mmol/L (ref 134–144)

## 2018-10-06 LAB — MAGNESIUM: Magnesium: 1.9 mg/dL (ref 1.6–2.3)

## 2018-11-09 ENCOUNTER — Ambulatory Visit (HOSPITAL_COMMUNITY): Payer: Medicare Other | Admitting: Nurse Practitioner

## 2019-01-16 ENCOUNTER — Other Ambulatory Visit: Payer: Self-pay | Admitting: Cardiovascular Disease

## 2019-04-11 ENCOUNTER — Other Ambulatory Visit: Payer: Self-pay

## 2019-04-11 ENCOUNTER — Encounter: Payer: Self-pay | Admitting: Cardiovascular Disease

## 2019-04-11 ENCOUNTER — Ambulatory Visit (INDEPENDENT_AMBULATORY_CARE_PROVIDER_SITE_OTHER): Payer: Medicare Other | Admitting: Cardiovascular Disease

## 2019-04-11 DIAGNOSIS — I480 Paroxysmal atrial fibrillation: Secondary | ICD-10-CM

## 2019-04-11 DIAGNOSIS — I350 Nonrheumatic aortic (valve) stenosis: Secondary | ICD-10-CM

## 2019-04-11 DIAGNOSIS — I10 Essential (primary) hypertension: Secondary | ICD-10-CM | POA: Diagnosis not present

## 2019-04-11 DIAGNOSIS — E785 Hyperlipidemia, unspecified: Secondary | ICD-10-CM

## 2019-04-11 DIAGNOSIS — I4510 Unspecified right bundle-branch block: Secondary | ICD-10-CM

## 2019-04-11 NOTE — Assessment & Plan Note (Signed)
History of PAF maintaining sinus rhythm on sotalol and Coumadin anticoagulation with an INR performed yesterday of 2.6.  She feels clinically improved in sinus rhythm compared to A. fib.

## 2019-04-11 NOTE — Assessment & Plan Note (Signed)
History of moderate aortic stenosis with aortic valve area measuring 1 cm by her most recent echo performed 09/14/2018.  This represents mild progression.  She has normal LV function.  She really has no symptoms of physiologically significant aortic stenosis at the current time.  We will continue to follow her by 2D echo on annual basis.

## 2019-04-11 NOTE — Assessment & Plan Note (Signed)
History of essential hypertension with blood pressure measured today 132/70.  She is on hydrochlorothiazide, losartan.

## 2019-04-11 NOTE — Patient Instructions (Addendum)
Medication Instructions:  Your physician recommends that you continue on your current medications as directed. Please refer to the Current Medication list given to you today.  If you need a refill on your cardiac medications before your next appointment, please call your pharmacy.   Lab work: none If you have labs (blood work) drawn today and your tests are completely normal, you will receive your results only by: Marland Kitchen MyChart Message (if you have MyChart) OR . A paper copy in the mail If you have any lab test that is abnormal or we need to change your treatment, we will call you to review the results.  Testing/Procedures: Your physician has requested that you have an echocardiogram. Echocardiography is a painless test that uses sound waves to create images of your heart. It provides your doctor with information about the size and shape of your heart and how well your heart's chambers and valves are working. This procedure takes approximately one hour. There are no restrictions for this procedure. LOCATION: [facility] at [facility]: [address], [place], [place] [address] DUE February 2021    Follow-Up: At [facility], you and your health needs are our priority.  As part of our continuing mission to provide you with exceptional heart care, we have created designated Provider Care Teams.  These Care Teams include your primary Cardiologist (physician) and Advanced Practice Providers (APPs -  Physician Assistants and Nurse Practitioners) who all work together to provide you with the care you need, when you need it. You will need a follow up appointment in 6 months Hollis [place], PA-C AND IN 12 MONTHS with Dr. Quay Burow.  Please call our office 2 months in advance to schedule each appointment.    ADDITIONAL INFORMATION:  You will be contacted by Neil Crouch, [place]

## 2019-04-11 NOTE — Assessment & Plan Note (Signed)
History of dyslipidemia on lovastatin followed by her PCP

## 2019-04-11 NOTE — Assessment & Plan Note (Signed)
Chronic. 

## 2019-04-11 NOTE — Progress Notes (Signed)
04/11/2019 Julia Rose   Apr 25, 1946  [id number]  Primary Physician Kaleen Mask, MD Primary Cardiologist: Runell Gess MD Nicholes Calamity, [place]  HPI:  Julia Rose is a 73 y.o.  moderately overweight married Caucasian female mother of 4 children, grandmother of a grandchildren whose husband [name] is also a patient of mine. She was referred by Dr. Jeannetta Nap for evaluation of recently recognized atrial fibrillation.I last sawher in the office  03/16/2018. Her cardiac risk factor profile is notable for treated hypertension, diabetes. She has hyperlipidemia intolerant to statin drugs. She's never had a heart stroke. She occasionally gets chest tightness. 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 She was recently found to be in A. Fib during a routine doctor's visit and was referred here for further evaluation. 8. Event monitor showed A. Fib with heart rates as high as 170. 2-D echo revealed normal LV systolic function with mild to moderate aortic stenosis and a Myoview stress test was low risk and nonischemic. Carotid Doppler showed no evidence of ICA stenosis. She was placed on oral anticoagulation. Today she is inatrial fibrillation with a controlled ventricular response on Coumadin anticoagulation.She underwent inpatient sotalol loading in the fall of last year and saw Rudi Coco and the A. fib clinic last month at which time she was in sinus bradycardia. She is back in A. fib. I was unaware of this. Recent 2-D echocardiogram performed 08/31/17 revealed progression of her aortic stenosis with a valve area of 1.08 cm and a peak gradient of 36 mmHg. She did tell me that recently one of her brothers died of ischemic heart disease post valve replacement and bypass grafting and another brother had bypass grafting as well.  Since I saw her in the office 12 months ago, she has maintained sinus rhythm on sotalol after feeling DC cardioversion.  She is on  Coumadin anticoagulation.  She denies chest pain or shortness of breath.  Her aortic valve continues to be in the moderate range of stenosis..   Current Meds  Medication Sig  . albuterol (PROVENTIL HFA;VENTOLIN HFA) 108 (90 Base) MCG/ACT inhaler Inhale 2 puffs into the lungs every 6 (six) hours as needed for wheezing or shortness of breath.  . hydrochlorothiazide (HYDRODIURIL) 25 MG tablet Take 25 mg by mouth daily.  . Loperamide HCl (ANTI-DIARRHEAL PO) Take 1 tablet by mouth as needed (for diarrhea).   . losartan (COZAAR) 100 MG tablet Take 50 mg by mouth at bedtime.  . lovastatin (MEVACOR) 20 MG tablet Take 20 mg by mouth daily at 6 PM.   . Magnesium 250 MG TABS Take 1 tablet by mouth daily.  . metFORMIN (GLUCOPHAGE) 1000 MG tablet Take 500-1,000 mg by mouth See admin instructions. Take 1000 mg by mouth in the morning  . Nutritional Supplements (JUICE PLUS FIBRE PO) Take by mouth.  Marland Kitchen PHENobarbital (LUMINAL) 97.2 MG tablet Take 97.2 mg by mouth at bedtime.   . Simethicone (GAS RELIEF) 180 MG CAPS Take 180 mg by mouth as needed (for gas).   . sotalol (BETAPACE) 120 MG tablet TAKE 1 TABLET BY MOUTH EVERY 12 HOURS  . warfarin (COUMADIN) 5 MG tablet Take 1 tablet (5 mg total) by mouth daily. (Patient taking differently: Take 7.5-10 mg by mouth See admin instructions. Take 7.5 mg by mouth daily for one day then take 10 mg by mouth daily for 4 days, then start cycle over)  . [DISCONTINUED] Aspirin-Acetaminophen-Caffeine (EXCEDRIN MIGRAINE PO) Take 1-2 tablets by  mouth daily as needed (for headache).   . [DISCONTINUED] nystatin-triamcinolone (MYCOLOG II) cream Apply 1 application topically 2 (two) times daily as needed (for yeast infection).     Allergies  Allergen Reactions  . Dilantin [Phenytoin] Hives and Other (See Comments)    Whelps all over for two weeks  . Statins Other (See Comments)    High dose statin - dizziness    Social History   Socioeconomic History  . Marital status:  Married    Spouse name: Not on file  . Number of children: Not on file  . Years of education: Not on file  . Highest education level: Not on file  Occupational History  . Not on file  Social Needs  . Financial resource strain: Not on file  . Food insecurity    Worry: Not on file    Inability: Not on file  . Transportation needs    Medical: Not on file    Non-medical: Not on file  Tobacco Use  . Smoking status: Never Smoker  . Smokeless tobacco: Never Used  Substance and Sexual Activity  . Alcohol use: No  . Drug use: No  . Sexual activity: Not Currently  Lifestyle  . Physical activity    Days per week: Not on file    Minutes per session: Not on file  . Stress: Not on file  Relationships  . Social Musicianconnections    Talks on phone: Not on file    Gets together: Not on file    Attends religious service: Not on file    Active member of club or organization: Not on file    Attends meetings of clubs or organizations: Not on file    Relationship status: Not on file  . Intimate partner violence    Fear of current or ex partner: Not on file    Emotionally abused: Not on file    Physically abused: Not on file    Forced sexual activity: Not on file  Other Topics Concern  . Not on file  Social History Narrative  . Not on file     Review of Systems: General: negative for chills, fever, night sweats or weight changes.  Cardiovascular: negative for chest pain, dyspnea on exertion, edema, orthopnea, palpitations, paroxysmal nocturnal dyspnea or shortness of breath Dermatological: negative for rash Respiratory: negative for cough or wheezing Urologic: negative for hematuria Abdominal: negative for nausea, vomiting, diarrhea, bright red blood per rectum, melena, or hematemesis Neurologic: negative for visual changes, syncope, or dizziness All other systems reviewed and are otherwise negative except as noted above.    Blood pressure 132/70, pulse 62, temperature (!) 97.5 F (36.4  C), height 5' (1.524 m), weight 198 lb (89.8 kg).  General appearance: alert and no distress Neck: no adenopathy, no carotid bruit, no JVD, supple, symmetrical, trachea midline and thyroid not enlarged, symmetric, no tenderness/mass/nodules Lungs: clear to auscultation bilaterally Heart: 2/6 systolic ejection murmur heard at the base consistent with aortic stenosis. Extremities: extremities normal, atraumatic, no cyanosis or edema Pulses: 2+ and symmetric Skin: Skin color, texture, turgor normal. No rashes or lesions Neurologic: Alert and oriented X 3, normal strength and tone. Normal symmetric reflexes. Normal coordination and gait  EKG sinus rhythm at 62 with right bundle branch block.  I personally reviewed this EKG.  ASSESSMENT AND PLAN:   PAF (paroxysmal atrial fibrillation) (HCC) History of PAF maintaining sinus rhythm on sotalol and Coumadin anticoagulation with an INR performed yesterday of 2.6.  She feels clinically  improved in sinus rhythm compared to A. fib.  Right bundle branch block Chronic  Essential hypertension History of essential hypertension with blood pressure measured today 132/70.  She is on hydrochlorothiazide, losartan.  Dyslipidemia, goal LDL below 70 History of dyslipidemia on lovastatin followed by her PCP  Aortic stenosis, moderate History of moderate aortic stenosis with aortic valve area measuring 1 cm by her most recent echo performed 09/14/2018.  This represents mild progression.  She has normal LV function.  She really has no symptoms of physiologically significant aortic stenosis at the current time.  We will continue to follow her by 2D echo on annual basis.      Lorretta Harp MD FACP,FACC,FAHA, [facility] 04/11/2019 8:51 AM

## 2019-04-27 ENCOUNTER — Other Ambulatory Visit: Payer: Self-pay | Admitting: Family Medicine

## 2019-04-27 DIAGNOSIS — Z1231 Encounter for screening mammogram for malignant neoplasm of breast: Secondary | ICD-10-CM

## 2019-04-27 DIAGNOSIS — E2839 Other primary ovarian failure: Secondary | ICD-10-CM

## 2019-08-25 ENCOUNTER — Ambulatory Visit: Payer: Medicare Other

## 2019-08-25 ENCOUNTER — Other Ambulatory Visit: Payer: Medicare Other

## 2019-09-20 ENCOUNTER — Other Ambulatory Visit: Payer: Self-pay

## 2019-09-20 ENCOUNTER — Ambulatory Visit (HOSPITAL_COMMUNITY): Payer: Medicare Other | Attending: Cardiology

## 2019-09-20 DIAGNOSIS — I350 Nonrheumatic aortic (valve) stenosis: Secondary | ICD-10-CM | POA: Diagnosis not present

## 2019-09-22 DIAGNOSIS — I350 Nonrheumatic aortic (valve) stenosis: Secondary | ICD-10-CM

## 2019-10-12 ENCOUNTER — Encounter: Payer: Self-pay | Admitting: Cardiology

## 2019-10-12 ENCOUNTER — Other Ambulatory Visit: Payer: Self-pay

## 2019-10-12 ENCOUNTER — Ambulatory Visit: Payer: Medicare Other | Admitting: Cardiology

## 2019-10-12 VITALS — BP 132/74 | HR 62 | Ht 60.0 in | Wt 202.4 lb

## 2019-10-12 DIAGNOSIS — I4510 Unspecified right bundle-branch block: Secondary | ICD-10-CM | POA: Diagnosis not present

## 2019-10-12 DIAGNOSIS — I350 Nonrheumatic aortic (valve) stenosis: Secondary | ICD-10-CM | POA: Diagnosis not present

## 2019-10-12 DIAGNOSIS — I480 Paroxysmal atrial fibrillation: Secondary | ICD-10-CM | POA: Diagnosis not present

## 2019-10-12 DIAGNOSIS — M47816 Spondylosis without myelopathy or radiculopathy, lumbar region: Secondary | ICD-10-CM

## 2019-10-12 DIAGNOSIS — Z7901 Long term (current) use of anticoagulants: Secondary | ICD-10-CM

## 2019-10-12 DIAGNOSIS — E119 Type 2 diabetes mellitus without complications: Secondary | ICD-10-CM

## 2019-10-12 NOTE — Assessment & Plan Note (Signed)
On Glucophage 

## 2019-10-12 NOTE — Assessment & Plan Note (Signed)
CHADS VASC=4- she is on Coumadin

## 2019-10-12 NOTE — Patient Instructions (Signed)
Medication Instructions:  Your physician recommends that you continue on your current medications as directed. Please refer to the Current Medication list given to you today.  *If you need a refill on your cardiac medications before your next appointment, please call your pharmacy*    Follow-Up: At [facility], you and your health needs are our priority.  As part of our continuing mission to provide you with exceptional heart care, we have created designated Provider Care Teams.  These Care Teams include your primary Cardiologist (physician) and Advanced Practice Providers (APPs -  Physician Assistants and Nurse Practitioners) who all work together to provide you with the care you need, when you need it.  We recommend signing up for the patient portal called "MyChart".  Sign up information is provided on this After Visit Summary.  MyChart is used to connect with patients for Virtual Visits (Telemedicine).  Patients are able to view lab/test results, encounter notes, upcoming appointments, etc.  Non-urgent messages can be sent to your provider as well.   To learn more about what you can do with MyChart, go to [email or web address]://[email or web address].com.    Your next appointment:   6 month(s)  The format for your next appointment:   In Person  Provider:   Jonathan Berry, MD   Other Instructions Please call our office 2 months in advance to schedule your follow-up appointment with Dr. Berry.  

## 2019-10-12 NOTE — Assessment & Plan Note (Signed)
This has limited her mobility-she has been unable to do PT at the gym secondary to COVID though she has received her vaccine

## 2019-10-12 NOTE — Assessment & Plan Note (Signed)
Echo Feb 2021- peak gradient 24.7 mmHg,

## 2019-10-12 NOTE — Assessment & Plan Note (Signed)
Occasional breakthrough with minimal symptoms.  Mainly NSR on Betapace 120 mg BID

## 2019-10-12 NOTE — Assessment & Plan Note (Signed)
Chronic. 

## 2019-10-12 NOTE — Progress Notes (Signed)
Cardiology Office Note:    Date:  10/12/2019   ID:  Julia Rose, DOB [birth year], MRN [id number]  PCP:  Kaleen Mask, MD  Cardiologist:  Nanetta Batty, MD  Electrophysiologist:  None   Referring MD: Kaleen Mask, *   No chief complaint on file.   History of Present Illness:    Julia Rose is a 74 y.o. female with a hx of PAF, mild to moderate AS, treated HTN, NIDDM, and HLD with statin intolerance.  She has previously been followed in the atrial fibrillation clinic. She has been in normal sinus rhythm on Betapace. She is in the office today for routine follow-up after her yearly echo Feb 2021. Apparently the patient was in atrial fibrillation when she had her echo. She tells me that she may go out of rhythm for a day or so at a time but it does not seem to bother her much. She is in normal sinus rhythm now. She admits around that time that she had been under some stress, she may have missed a dose of medication or taken a dose late. She has not had chest pain, dyspnea, or tachycardia.     Past Medical History:  Diagnosis Date  . Arthritis    "hands" (04/06/2017)  . Asthma   . Atrial fibrillation (HCC)   . Atrial fibrillation with RVR (HCC)   . Chronic back pain    "from the DDD"  . DDD (degenerative disc disease), cervical   . DDD (degenerative disc disease), lumbar   . DDD (degenerative disc disease), thoracic    "not dx'd yet" (04/06/2017)  . Headache   . Heart murmur   . Hyperlipidemia    statin intolerant  . Hypertension   . Migraine    "thru the 1990s; probably stopped after my periods stopped/hysterectomy"  . Moderate aortic stenosis   . Right bundle branch block   . Seizures (HCC)    "when I was younger; none since the 5s" (04/06/2017)  . Type II diabetes mellitus (HCC)     Past Surgical History:  Procedure Laterality Date  . CARDIOVERSION N/A 12/17/2016   Procedure: CARDIOVERSION;  Surgeon: Elease Hashimoto, Deloris Ping, MD;  Location: [facility] ENDOSCOPY;   Service: Cardiovascular;  Laterality: N/A;  . DIAGNOSTIC LAPAROSCOPY     "paraovarian cyst removed"  . TONSILLECTOMY    . VAGINAL HYSTERECTOMY     "partial"    Current Medications: Current Meds  Medication Sig  . albuterol (PROVENTIL HFA;VENTOLIN HFA) 108 (90 Base) MCG/ACT inhaler Inhale 2 puffs into the lungs every 6 (six) hours as needed for wheezing or shortness of breath.  . hydrochlorothiazide (HYDRODIURIL) 25 MG tablet Take 25 mg by mouth daily.  . Loperamide HCl (ANTI-DIARRHEAL PO) Take 1 tablet by mouth as needed (for diarrhea).   . losartan (COZAAR) 100 MG tablet Take 50 mg by mouth at bedtime.  . lovastatin (MEVACOR) 20 MG tablet Take 20 mg by mouth daily at 6 PM.   . Magnesium 250 MG TABS Take 1 tablet by mouth daily.  . metFORMIN (GLUCOPHAGE) 1000 MG tablet Take 500-1,000 mg by mouth See admin instructions. Take 1000 mg by mouth in the morning  . Nutritional Supplements (JUICE PLUS FIBRE PO) Take by mouth.  Marland Kitchen PHENobarbital (LUMINAL) 97.2 MG tablet Take 97.2 mg by mouth at bedtime.   . Simethicone (GAS RELIEF) 180 MG CAPS Take 180 mg by mouth as needed (for gas).   . sotalol (BETAPACE) 120 MG tablet TAKE 1 TABLET  BY MOUTH EVERY 12 HOURS  . warfarin (COUMADIN) 5 MG tablet Take 1 tablet (5 mg total) by mouth daily. (Patient taking differently: Take 7.5-10 mg by mouth See admin instructions. Take 7.5 mg by mouth daily for one day then take 10 mg by mouth daily for 4 days, then start cycle over)     Allergies:   Dilantin [phenytoin] and Statins   Social History   Socioeconomic History  . Marital status: Married    Spouse name: Not on file  . Number of children: Not on file  . Years of education: Not on file  . Highest education level: Not on file  Occupational History  . Not on file  Tobacco Use  . Smoking status: Never Smoker  . Smokeless tobacco: Never Used  Substance and Sexual Activity  . Alcohol use: No  . Drug use: No  . Sexual activity: Not Currently  Other  Topics Concern  . Not on file  Social History Narrative  . Not on file   Social Determinants of Health   Financial Resource Strain:   . Difficulty of Paying Living Expenses:   Food Insecurity:   . Worried About Programme researcher, broadcasting/film/video in the Last Year:   . Barista in the Last Year:   Transportation Needs:   . Freight forwarder (Medical):   Marland Kitchen Lack of Transportation (Non-Medical):   Physical Activity:   . Days of Exercise per Week:   . Minutes of Exercise per Session:   Stress:   . Feeling of Stress :   Social Connections:   . Frequency of Communication with Friends and Family:   . Frequency of Social Gatherings with Friends and Family:   . Attends Religious Services:   . Active Member of Clubs or Organizations:   . Attends Banker Meetings:   Marland Kitchen Marital Status:      Family History: The patient's family history includes Dementia in her mother; Diabetes in her mother; Hypertension in her mother; Kidney disease in her mother.  ROS:   Please see the history of present illness.     All other systems reviewed and are negative.  EKGs/Labs/Other Studies Reviewed:    The following studies were reviewed today: Echo 09/20/2019  EKG:  EKG is ordered today.  The ekg ordered today demonstrates NSR-HR 62, QTc 483, RBBB  Recent Labs: No results found for requested labs within last 8760 hours.  Recent Lipid Panel No results found for: CHOL, TRIG, HDL, CHOLHDL, VLDL, LDLCALC, LDLDIRECT  Physical Exam:    VS:  BP 132/74   Pulse 62   Ht 5' (1.524 m)   Wt 202 lb 6.4 oz (91.8 kg)   BMI 39.53 kg/m     Wt Readings from Last 3 Encounters:  10/12/19 202 lb 6.4 oz (91.8 kg)  04/11/19 198 lb (89.8 kg)  10/05/18 201 lb (91.2 kg)     GEN: Overweight caucasian female, well developed in no acute distress HEENT: Normal NECK: No JVD; No carotid bruits CARDIAC: RRR, 2/6 systolic murmur AOV, preserved S2, no rubs, gallops RESPIRATORY:  Clear to auscultation without  rales, wheezing or rhonchi  ABDOMEN: Soft, non-tender, non-distended MUSCULOSKELETAL:  No edema; No deformity  SKIN: Warm and dry NEUROLOGIC:  Alert and oriented x 3 PSYCHIATRIC:  Normal affect   ASSESSMENT:    PAF (paroxysmal atrial fibrillation) (HCC) Occasional breakthrough with minimal symptoms.  Mainly NSR on Betapace 120 mg BID  Aortic stenosis, moderate Echo Feb 2021-  peak gradient 24.7 mmHg,    Chronic anticoagulation CHADS VASC=4- she is on Coumadin  Right bundle branch block Chronic  Non-insulin treated type 2 diabetes mellitus ([place]) On Glucophage   DJD (degenerative joint disease), lumbar This has limited her mobility-she has been unable to do PT at the gym secondary to [place] though she has received her vaccine  PLAN:    No change in Rx- f/u Dr Gwenlyn Found in 6 months.    Medication Adjustments/Labs and Tests Ordered: Current medicines are reviewed at length with the patient today.  Concerns regarding medicines are outlined above.  Orders Placed This Encounter  Procedures  . EKG 12-Lead   No orders of the defined types were placed in this encounter.   Patient Instructions  Medication Instructions:  Your physician recommends that you continue on your current medications as directed. Please refer to the Current Medication list given to you today.  *If you need a refill on your cardiac medications before your next appointment, please call your pharmacy*   Follow-Up: At [facility], you and your health needs are our priority.  As part of our continuing mission to provide you with exceptional heart care, we have created designated Provider Care Teams.  These Care Teams include your primary Cardiologist (physician) and Advanced Practice Providers (APPs -  Physician Assistants and Nurse Practitioners) who all work together to provide you with the care you need, when you need it.  We recommend signing up for the patient portal called "MyChart".  Sign up information  is provided on this After Visit Summary.  MyChart is used to connect with patients for Virtual Visits (Telemedicine).  Patients are able to view lab/test results, encounter notes, upcoming appointments, etc.  Non-urgent messages can be sent to your provider as well.   To learn more about what you can do with MyChart, go to [email or web address].    Your next appointment:   6 month(s)  The format for your next appointment:   In Person  Provider:   Quay Burow, MD   Other Instructions Please call our office 2 months in advance to schedule your follow-up appointment with Dr. Gwenlyn Found.     Angelena Form, PA-C  10/12/2019 3:29 PM    [facility]

## 2019-10-16 ENCOUNTER — Other Ambulatory Visit: Payer: Self-pay | Admitting: Cardiovascular Disease

## 2020-03-18 ENCOUNTER — Other Ambulatory Visit: Payer: Self-pay | Admitting: Family Medicine

## 2020-03-18 DIAGNOSIS — Z1231 Encounter for screening mammogram for malignant neoplasm of breast: Secondary | ICD-10-CM

## 2020-03-18 DIAGNOSIS — E2839 Other primary ovarian failure: Secondary | ICD-10-CM

## 2020-04-17 ENCOUNTER — Other Ambulatory Visit: Payer: Self-pay | Admitting: Cardiovascular Disease

## 2020-04-19 ENCOUNTER — Other Ambulatory Visit: Payer: Self-pay

## 2020-04-19 ENCOUNTER — Ambulatory Visit: Payer: Medicare Other | Admitting: Cardiovascular Disease

## 2020-04-19 ENCOUNTER — Encounter: Payer: Self-pay | Admitting: Cardiovascular Disease

## 2020-04-19 DIAGNOSIS — I480 Paroxysmal atrial fibrillation: Secondary | ICD-10-CM

## 2020-04-19 DIAGNOSIS — I4510 Unspecified right bundle-branch block: Secondary | ICD-10-CM

## 2020-04-19 DIAGNOSIS — I350 Nonrheumatic aortic (valve) stenosis: Secondary | ICD-10-CM

## 2020-04-19 DIAGNOSIS — I10 Essential (primary) hypertension: Secondary | ICD-10-CM | POA: Diagnosis not present

## 2020-04-19 DIAGNOSIS — E785 Hyperlipidemia, unspecified: Secondary | ICD-10-CM

## 2020-04-19 NOTE — Assessment & Plan Note (Signed)
History of essential hypertension blood pressure measured today 116/68.  She is on hydrochlorothiazide, losartan and sotalol.

## 2020-04-19 NOTE — Assessment & Plan Note (Signed)
History of PAF on sotalol and warfarin anticoagulation.  She is in A. fib today with ventricular response of 76 right bundle branch block.  She is unaware of this.

## 2020-04-19 NOTE — Assessment & Plan Note (Signed)
History of moderate aortic stenosis by 2D echocardiogram performed 09/21/2019 with an aortic valve area of 1.09 cm and a peak gradient of 24 mmHg, unchanged from her prior echo.  She is completely asymptomatic from this.  We will recheck a 2D echo in February.

## 2020-04-19 NOTE — Assessment & Plan Note (Signed)
Chronic. 

## 2020-04-19 NOTE — Patient Instructions (Addendum)
Medication Instructions:  No Changes In Medications at this time.  *If you need a refill on your cardiac medications before your next appointment, please call your pharmacy*  Lab Work: LIPID AND LIVER BLOOD WORK TODAY BEFORE LEAVING  If you have labs (blood work) drawn today and your tests are completely normal, you will receive your results only by: Marland Kitchen MyChart Message (if you have MyChart) OR . A paper copy in the mail If you have any lab test that is abnormal or we need to change your treatment, we will call you to review the results.  Testing/Procedures: Your physician has requested that you have an echocardiogram in [place] OF NEXT YEAR. Echocardiography is a painless test that uses sound waves to create images of your heart. It provides your doctor with information about the size and shape of your heart and how well your heart's chambers and valves are working. You may receive an ultrasound enhancing agent through an IV if needed to better visualize your heart during the echo.This procedure takes approximately one hour. There are no restrictions for this procedure. This will take place at the [address], [address].   Follow-Up: At [facility], you and your health needs are our priority.  As part of our continuing mission to provide you with exceptional heart care, we have created designated Provider Care Teams.  These Care Teams include your primary Cardiologist (physician) and Advanced Practice Providers (APPs -  Physician Assistants and Nurse Practitioners) who all work together to provide you with the care you need, when you need it.  We recommend signing up for the patient portal called "MyChart".  Sign up information is provided on this After Visit Summary.  MyChart is used to connect with patients for Virtual Visits (Telemedicine).  Patients are able to view lab/test results, encounter notes, upcoming appointments, etc.  Non-urgent messages can be sent to your provider as well.    To learn more about what you can do with MyChart, go to [email or web address].    Your next appointment:   1 year(s)  The format for your next appointment:   In Person  Provider:   Nanetta Batty, MD   PLEASE SPEAK WITH PHARMACIST HERE IN OFFICE REGARDING MEDICATIONS

## 2020-04-19 NOTE — Assessment & Plan Note (Signed)
History of dyslipidemia on statin therapy.  We will recheck a lipid liver profile this morning.

## 2020-04-19 NOTE — Progress Notes (Signed)
04/19/2020 Julia Rose   10-26-1945  [id number]  Primary Physician Kaleen Mask, MD Primary Cardiologist: Runell Gess MD Nicholes Calamity, [place]  HPI:  Julia Rose is a 74 y.o.  moderately overweight married Caucasian female mother of 4 children, grandmother of a grandchildren whose husband [name] is also a patient of mine. She was referred by Dr. Jeannetta Nap for evaluation of recently recognized atrial fibrillation.I last sawher in the office9/15/2020 her cardiac risk factor profile is notable for treated hypertension, diabetes. She has hyperlipidemia intolerant to statin drugs. She's never had a heart stroke. She occasionally gets chest tightness. 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 She was recently found to be in A. Fib during a routine doctor's visit and was referred here for further evaluation. 8. Event monitor showed A. Fib with heart rates as high as 170. 2-D echo revealed normal LV systolic function with mild to moderate aortic stenosis and a Myoview stress test was low risk and nonischemic. Carotid Doppler showed no evidence of ICA stenosis. She was placed on oral anticoagulation. Today she is inatrial fibrillation with a controlled ventricular response on Coumadin anticoagulation.She underwent inpatient sotalol loading in the fall of last year and saw Rudi Coco and the A. fib clinic last month at which time she was in sinus bradycardia. She is back in A. fib. I was unaware of this. Recent 2-D echocardiogram performed 08/31/17 revealed progression of her aortic stenosis with a valve area of 1.08 cm and a peak gradient of 36 mmHg. She did tell me that recently one of her brothers died of ischemic heart disease post valve replacement and bypass grafting and another brother had bypass grafting as well.  Since I saw her a year ago she is remained stable.  She is in atrial fibrillation today but is unaware of this on warfarin anticoagulation  which which Dr. Jeannetta Nap follows.  She did have a 2D echocardiogram performed 09/20/2019 revealing again moderate aortic stenosis with a valve area of 1.09 cm.  And a peak gradient of 24 mmHg, unchanged from prior echo.   Current Meds  Medication Sig  . albuterol (PROVENTIL HFA;VENTOLIN HFA) 108 (90 Base) MCG/ACT inhaler Inhale 2 puffs into the lungs every 6 (six) hours as needed for wheezing or shortness of breath.  . citalopram (CELEXA) 20 MG tablet Take 20 mg by mouth daily.  . hydrochlorothiazide (HYDRODIURIL) 25 MG tablet Take 25 mg by mouth daily.  . Loperamide HCl (ANTI-DIARRHEAL PO) Take 1 tablet by mouth as needed (for diarrhea).   . losartan (COZAAR) 100 MG tablet Take 50 mg by mouth at bedtime.  . lovastatin (MEVACOR) 20 MG tablet Take 20 mg by mouth daily at 6 PM.   . Magnesium 250 MG TABS Take 1 tablet by mouth daily.  . metFORMIN (GLUCOPHAGE) 1000 MG tablet Take 500-1,000 mg by mouth See admin instructions. Take 1000 mg by mouth in the morning  . Nutritional Supplements (JUICE PLUS FIBRE PO) Take by mouth.  Marland Kitchen PHENobarbital (LUMINAL) 97.2 MG tablet Take 97.2 mg by mouth at bedtime.   . Simethicone (GAS RELIEF) 180 MG CAPS Take 180 mg by mouth as needed (for gas).   . sotalol (BETAPACE) 120 MG tablet TAKE 1 TABLET BY MOUTH EVERY 12 HOURS  . warfarin (COUMADIN) 5 MG tablet Take 1 tablet (5 mg total) by mouth daily. (Patient taking differently: Take 7.5-10 mg by mouth See admin instructions. Take 7.5 mg by mouth daily for one  day then take 10 mg by mouth daily for 4 days, then start cycle over)     Allergies  Allergen Reactions  . Dilantin [Phenytoin] Hives and Other (See Comments)    [place] all over for two weeks  . Statins Other (See Comments)    High dose statin - dizziness    Social History   Socioeconomic History  . Marital status: Married    Spouse name: Not on file  . Number of children: Not on file  . Years of education: Not on file  . Highest education level: Not on  file  Occupational History  . Not on file  Tobacco Use  . Smoking status: Never Smoker  . Smokeless tobacco: Never Used  Vaping Use  . Vaping Use: Never used  Substance and Sexual Activity  . Alcohol use: No  . Drug use: No  . Sexual activity: Not Currently  Other Topics Concern  . Not on file  Social History Narrative  . Not on file   Social Determinants of Health   Financial Resource Strain:   . Difficulty of Paying Living Expenses: Not on file  Food Insecurity:   . Worried About Programme researcher, broadcasting/film/video in the Last Year: Not on file  . Ran Out of Food in the Last Year: Not on file  Transportation Needs:   . Lack of Transportation (Medical): Not on file  . Lack of Transportation (Non-Medical): Not on file  Physical Activity:   . Days of Exercise per Week: Not on file  . Minutes of Exercise per Session: Not on file  Stress:   . Feeling of Stress : Not on file  Social Connections:   . Frequency of Communication with Friends and Family: Not on file  . Frequency of Social Gatherings with Friends and Family: Not on file  . Attends Religious Services: Not on file  . Active Member of Clubs or Organizations: Not on file  . Attends Banker Meetings: Not on file  . Marital Status: Not on file  Intimate Partner Violence:   . Fear of Current or Ex-Partner: Not on file  . Emotionally Abused: Not on file  . Physically Abused: Not on file  . Sexually Abused: Not on file     Review of Systems: General: negative for chills, fever, night sweats or weight changes.  Cardiovascular: negative for chest pain, dyspnea on exertion, edema, orthopnea, palpitations, paroxysmal nocturnal dyspnea or shortness of breath Dermatological: negative for rash Respiratory: negative for cough or wheezing Urologic: negative for hematuria Abdominal: negative for nausea, vomiting, diarrhea, bright red blood per rectum, melena, or hematemesis Neurologic: negative for visual changes, syncope, or  dizziness All other systems reviewed and are otherwise negative except as noted above.    Blood pressure 116/68, pulse 76, height 5' (1.524 m), weight 189 lb 12.8 oz (86.1 kg), SpO2 98 %.  General appearance: alert and no distress Neck: no adenopathy, no carotid bruit, no JVD, supple, symmetrical, trachea midline and thyroid not enlarged, symmetric, no tenderness/mass/nodules Lungs: clear to auscultation bilaterally Heart: irregularly irregular rhythm Extremities: extremities normal, atraumatic, no cyanosis or edema Pulses: 2+ and symmetric Skin: Skin color, texture, turgor normal. No rashes or lesions Neurologic: Alert and oriented X 3, normal strength and tone. Normal symmetric reflexes. Normal coordination and gait  EKG atrial fibrillation with right bundle branch block and ventricular response of 76.  Her QTC is 513 ms.  I personally reviewed this EKG.  ASSESSMENT AND PLAN:   PAF (  paroxysmal atrial fibrillation) (HCC) History of PAF on sotalol and warfarin anticoagulation.  She is in A. fib today with ventricular response of 76 right bundle branch block.  She is unaware of this.  Right bundle branch block Chronic  Essential hypertension History of essential hypertension blood pressure measured today 116/68.  She is on hydrochlorothiazide, losartan and sotalol.  Dyslipidemia, goal LDL below 70 History of dyslipidemia on statin therapy.  We will recheck a lipid liver profile this morning.  Aortic stenosis, moderate History of moderate aortic stenosis by 2D echocardiogram performed 09/21/2019 with an aortic valve area of 1.09 cm and a peak gradient of 24 mmHg, unchanged from her prior echo.  She is completely asymptomatic from this.  We will recheck a 2D echo in February.      Runell Gess MD FACP,FACC,FAHA, [facility] 04/19/2020 10:29 AM

## 2020-04-20 LAB — LIPID PANEL
Chol/HDL Ratio: 4.8 ratio — ABNORMAL HIGH (ref 0.0–4.4)
Cholesterol, Total: 177 mg/dL (ref 100–199)
HDL: 37 mg/dL — ABNORMAL LOW (ref >39)
LDL Chol Calc (NIH): 108 mg/dL — ABNORMAL HIGH (ref 0–99)
Triglycerides: 183 mg/dL — ABNORMAL HIGH (ref 0–149)
VLDL Cholesterol Cal: 32 mg/dL (ref 5–40)

## 2020-04-20 LAB — HEPATIC FUNCTION PANEL
ALT: 26 IU/L (ref 0–32)
AST: 26 IU/L (ref 0–40)
Albumin: 4.8 g/dL — ABNORMAL HIGH (ref 3.7–4.7)
Alkaline Phosphatase: 59 IU/L (ref 44–121)
Bilirubin Total: 0.4 mg/dL (ref 0.0–1.2)
Bilirubin, Direct: 0.14 mg/dL (ref 0.00–0.40)
Total Protein: 6.7 g/dL (ref 6.0–8.5)

## 2020-04-22 ENCOUNTER — Telehealth: Payer: Self-pay | Admitting: Pharmacist

## 2020-04-22 NOTE — Telephone Encounter (Signed)
QTc question  Noted recent ECG shows QTc at 513 vs QTc at 483 on 10/12/2019  Citalopram 20mg  daily added to therapy by PCP.   Recommendation: 1. Citalopram is not high risk to prolong QTc but should decrease dose if possible or continue close monitoring  2. Continue taking magnesium supplement  3. Avoid ANY medication that can cause addiotional QTc changes.

## 2020-04-24 ENCOUNTER — Telehealth: Payer: Self-pay | Admitting: Cardiovascular Disease

## 2020-04-24 ENCOUNTER — Other Ambulatory Visit: Payer: Self-pay

## 2020-04-24 DIAGNOSIS — Z79899 Other long term (current) drug therapy: Secondary | ICD-10-CM

## 2020-04-24 MED ORDER — ATORVASTATIN CALCIUM 20 MG PO TABS: 20.0000 mg | ORAL_TABLET | Freq: Every day | ORAL | 3 refills | Status: DC

## 2020-04-24 NOTE — Telephone Encounter (Signed)
Spoke with husband (DPR) and notified him that lab results and med list will be faxed

## 2020-04-24 NOTE — Telephone Encounter (Signed)
Pt wanted to know if we could send a copy of her lab results and her updated medication list to her PCP Dr. Windle Guard at [phone].

## 2020-06-19 ENCOUNTER — Other Ambulatory Visit: Payer: Self-pay

## 2020-06-19 DIAGNOSIS — Z79899 Other long term (current) drug therapy: Secondary | ICD-10-CM

## 2020-06-20 LAB — LIPID PANEL
Chol/HDL Ratio: 3.9 ratio (ref 0.0–4.4)
Cholesterol, Total: 150 mg/dL (ref 100–199)
HDL: 38 mg/dL — ABNORMAL LOW (ref >39)
LDL Chol Calc (NIH): 73 mg/dL (ref 0–99)
Triglycerides: 234 mg/dL — ABNORMAL HIGH (ref 0–149)
VLDL Cholesterol Cal: 39 mg/dL (ref 5–40)

## 2020-06-25 ENCOUNTER — Other Ambulatory Visit: Payer: Self-pay

## 2020-06-25 ENCOUNTER — Ambulatory Visit
Admission: RE | Admit: 2020-06-25 | Discharge: 2020-06-25 | Disposition: A | Discharge status: Home / Self Care | Payer: Medicare Other | Source: Ambulatory Visit | Attending: Family Medicine | Admitting: Family Medicine

## 2020-06-25 DIAGNOSIS — E2839 Other primary ovarian failure: Secondary | ICD-10-CM

## 2020-06-25 DIAGNOSIS — Z1231 Encounter for screening mammogram for malignant neoplasm of breast: Secondary | ICD-10-CM

## 2020-07-05 ENCOUNTER — Other Ambulatory Visit: Payer: Self-pay | Admitting: Cardiovascular Disease

## 2020-09-19 ENCOUNTER — Other Ambulatory Visit: Payer: Self-pay

## 2020-09-19 ENCOUNTER — Ambulatory Visit (HOSPITAL_COMMUNITY): Payer: Medicare Other | Attending: Cardiology

## 2020-09-19 DIAGNOSIS — I350 Nonrheumatic aortic (valve) stenosis: Secondary | ICD-10-CM | POA: Insufficient documentation

## 2020-09-19 LAB — ECHOCARDIOGRAM COMPLETE
AR max vel: 1.15 cm2
AV Area VTI: 1.15 cm2
AV Area mean vel: 1.08 cm2
AV Mean grad: 15 mmHg
AV Peak grad: 22.6 mmHg
Ao pk vel: 2.38 m/s
S' Lateral: 2.7 cm

## 2021-03-20 ENCOUNTER — Telehealth: Payer: Self-pay

## 2021-03-20 NOTE — Telephone Encounter (Signed)
    Patient Name: Julia Rose  DOB: 08/23/1945 MRN: [id number]  Primary Cardiologist: Nanetta Batty, MD  Chart reviewed as part of pre-operative protocol coverage. Given past medical history and time since last visit, based on ACC/AHA guidelines, [name] would be at acceptable risk for the planned procedure without further cardiovascular testing.   She is followed by Dr. Allyson Sabal for her atrial fibrillation and has been stable with this since she was last seen in follow up. She is on chronic anticoagulation with Warfarin which is followed by her PCP, Dr. Jeannetta Nap. Warfarin holding recommendations will need to come from their team.   The patient was advised that if she develops new symptoms prior to surgery to contact our office to arrange for a follow-up visit, and she verbalized understanding.  I will route this recommendation to the requesting party via Epic fax function and remove from pre-op pool.  Please call with questions.  Georgie Chard, NP 03/20/2021, 2:16 PM

## 2021-03-20 NOTE — Telephone Encounter (Signed)
   [facility] Pre-operative Risk Assessment    Patient Name: Julia Rose  DOB: 02/23/1946 MRN: [id number]  HEARTCARE STAFF:  - IMPORTANT!!!!!! Under Visit Info/Reason for Call, type in Other and utilize the format Clearance MM/DD/YY or Clearance TBD. Do not use dashes or single digits. - Please review there is not already an duplicate clearance open for this procedure. - If request is for dental extraction, please clarify the # of teeth to be extracted. - If the patient is currently at the dentist's office, call Pre-Op Callback Staff (MA/nurse) to input urgent request.  - If the patient is not currently in the dentist office, please route to the Pre-Op pool.  Request for surgical clearance:  What type of surgery is being performed? Incisional Hernia Repair   When is this surgery scheduled? TBD  What type of clearance is required (medical clearance vs. Pharmacy clearance to hold med vs. Both)? Cardiac   Are there any medications that need to be held prior to surgery and how long? Warfarin   Practice name and name of physician performing surgery? [facility]   What is the office phone number? [phone]   7.   What is the office fax number? [phone]  8.   Anesthesia type (None, local, MAC, general) ? General    Zebedee Iba 03/20/2021, 1:37 PM  _________________________________________________________________   (provider comments below)

## 2021-04-01 NOTE — Telephone Encounter (Signed)
Patient with diagnosis of afib on warfarin for anticoagulation.    CHA2DS2-VASc Score = 5  This indicates a 7.2% annual risk of stroke. The patient's score is based upon: CHF History: No HTN History: Yes Diabetes History: Yes Stroke History: No Vascular Disease History: No Age Score: 2 Gender Score: 1   Per office protocol, patient can hold warfarin for 5 days prior to procedure. Patient will not need bridging with Lovenox around procedure. She should restart warfarin on the evening of procedure or day after, at discretion of procedure MD.

## 2021-04-01 NOTE — Telephone Encounter (Signed)
   Pt c/o medication issue:  1. Name of Medication: lovenox bridge   2. How are you currently taking this medication (dosage and times per day)?   3. Are you having a reaction (difficulty breathing--STAT)?   4. What is your medication issue? Jamie with Dr. Renato Gails office called, she said they suggested pt to hold eliquis 5 days prior procedure, however, Dr. Andrey Campanile said pt might need a lovenox bridge, Dr. Jeannetta Nap would like to get a peer suggestion to from Dr. Allyson Sabal if Dr. Allyson Sabal would like to arrange it and if it's neccessary for pt to get lovenox bridge for this procedure

## 2021-04-01 NOTE — Telephone Encounter (Signed)
Pt is on warfarin for Afib, managed by PCP. It appears PCP would like our recommendations for lovenox bridge. The request was for a peer-to-peer with Dr. Allyson Sabal; however, our clinical pharmacists generally give recommendations. I will route to both.

## 2021-04-02 NOTE — Telephone Encounter (Signed)
Jamie from [facility] would like to know if Dr. Allyson Sabal would like for this pt to have a Lovenox Bridge Management   [phone]  Asher Muir stated that she have called several times before and no one have returned her calls and she would like to get this pt's procedure scheduled.

## 2021-04-02 NOTE — Telephone Encounter (Signed)
Will forward to pre op and Pharm D for further recommendations

## 2021-04-02 NOTE — Telephone Encounter (Signed)
Patient does not need a lovenox bridge when holding warfarin.  Please let Dr. Shelah Lewandowsky know.

## 2021-04-02 NOTE — Telephone Encounter (Signed)
I called Dr. Jeannetta Nap office and informed pt will not need Lovenox bridging. I also faxed notes to surgeon's office. I then tried to call Asher Muir with [facility] to let her know as well as pt will not need Lovenox, though I was not able to leave a message for [place].

## 2021-05-08 NOTE — Progress Notes (Signed)
Sent message, via epic in basket, requesting orders in epic from surgeon.  

## 2021-05-09 ENCOUNTER — Ambulatory Visit: Payer: Self-pay | Admitting: General Surgery

## 2021-05-09 DIAGNOSIS — Z7901 Long term (current) use of anticoagulants: Secondary | ICD-10-CM

## 2021-05-10 ENCOUNTER — Other Ambulatory Visit: Payer: Self-pay | Admitting: Cardiovascular Disease

## 2021-05-13 NOTE — Patient Instructions (Signed)
DUE TO COVID-19 ONLY ONE VISITOR IS ALLOWED TO COME WITH YOU AND STAY IN THE WAITING ROOM ONLY DURING PRE OP AND PROCEDURE DAY OF SURGERY IF YOU ARE GOING HOME AFTER SURGERY. IF YOU ARE SPENDING THE NIGHT 2 PEOPLE MAY VISIT WITH YOU IN YOUR PRIVATE ROOM AFTER SURGERY UNTIL VISITING  HOURS ARE OVER AT 800 PM AND 1 VISITOR CAN SPEND THE NIGHT.   YOU NEED TO HAVE A COVID 19 TEST ON___11/1___THIS TEST MUST BE DONE BEFORE SURGERY,  COVID TESTING SITE  IS LOCATED AT [address], [place]. REMAIN IN YOUR CAR THIS IS A DRIVE UP TEST. AFTER YOUR COVID TEST PLEASE WEAR A MASK OUT IN PUBLIC AND SOCIAL DISTANCE AND WASH YOUR HANDS FREQUENTLY, ALSO ASK ALL YOUR CLOSE CONTACT PERSONS TO WEAR A MASK AND SOCIAL DISTANCE AND WASH THEIR HANDS FREQUENTLY ALSO.               KAYLN GARCEAU     Your procedure is scheduled on: 05/29/21   Report to [facility] Main  Entrance   Report to short stay at 5:15 AM     Call this number if you have problems the morning of surgery [phone]    Remember: Do not eat food  :After Midnight the night before your surgery,     You may have clear liquids from midnight until ---.4:30 am    CLEAR LIQUID DIET   Foods Allowed                                                                     Foods Excluded                                                                                             liquids that you cannot  Plain Jell-O any favor except red or purple                                           see through such as: Fruit ices (not with fruit pulp)                                     milk, soups, orange juice  Iced Popsicles                                    All solid food Carbonated beverages, regular and diet                                    Cranberry, grape  and apple juices Sports drinks like Gatorade Lightly seasoned clear broth or consume(fat free) Sugar     BRUSH YOUR TEETH MORNING OF SURGERY AND RINSE YOUR MOUTH OUT, NO  CHEWING GUM CANDY OR MINTS.     Take these medicines the morning of surgery with A SIP OF WATER: Use your inhaler and bring it with you. Citalopram, Sotalol Bring your mask and tubing How to Manage Your Diabetes Before and After Surgery  Why is it important to control my blood sugar before and after surgery? Improving blood sugar levels before and after surgery helps healing and can limit problems. A way of improving blood sugar control is eating a healthy diet by:  Eating less sugar and carbohydrates  Increasing activity/exercise  Talking with your doctor about reaching your blood sugar goals High blood sugars (greater than 180 mg/dL) can raise your risk of infections and slow your recovery, so you will need to focus on controlling your diabetes during the weeks before surgery. Make sure that the doctor who takes care of your diabetes knows about your planned surgery including the date and location.  How do I manage my blood sugar before surgery? Check your blood sugar at least 4 times a day, starting 2 days before surgery, to make sure that the level is not too high or low. Check your blood sugar the morning of your surgery when you wake up and every 2 hours until you get to the Short Stay unit. If your blood sugar is less than 70 mg/dL, you will need to treat for low blood sugar: Do not take insulin. Treat a low blood sugar (less than 70 mg/dL) with  cup of clear juice (cranberry or apple), 4 glucose tablets, OR glucose gel. Recheck blood sugar in 15 minutes after treatment (to make sure it is greater than 70 mg/dL). If your blood sugar is not greater than 70 mg/dL on recheck, call [phone] for further instructions. Report your blood sugar to the short stay nurse when you get to Short Stay.  If you are admitted to the hospital after surgery: Your blood sugar will be checked by the staff and you will probably be given insulin after surgery (instead of oral diabetes medicines) to  make sure you have good blood sugar levels. The goal for blood sugar control after surgery is 80-180 mg/dL.   WHAT DO I DO ABOUT MY DIABETES MEDICATION?  Do not take oral diabetes medicines (pills) the morning of surgery.  Stop taking __coumadin_________on __________as instructed by _____________.  Stop taking ____________as directed by your Surgeon/Cardiologist.  Contact your Surgeon/Cardiologist for instructions on Anticoagulant Therapy prior to surgery.                                   You may not have any metal on your body including hair pins and              piercings  Do not wear jewelry, make-up, lotions, powders or perfumes, deodorant             Do not wear nail polish on your fingernails.  Do not shave  48 hours prior to surgery.               Do not bring valuables to the hospital. [facility] IS NOT             RESPONSIBLE   FOR VALUABLES.  Contacts, dentures or  bridgework may not be worn into surgery.  Leave suitcase in the car. After surgery it may be brought to your room.     Patients discharged the day of surgery will not be allowed to drive home.  IF YOU ARE HAVING SURGERY AND GOING HOME THE SAME DAY, YOU MUST HAVE AN ADULT TO DRIVE YOU HOME AND BE WITH YOU FOR 24 HOURS. YOU MAY GO HOME BY TAXI OR UBER OR ORTHERWISE, BUT AN ADULT MUST ACCOMPANY YOU HOME AND STAY WITH YOU FOR 24 HOURS.  Name and phone number of your driver:  Special Instructions: N/A              Please read over the following fact sheets you were given: _____________________________________________________________________             [facility] - Preparing for Surgery Before surgery, you can play an important role.  Because skin is not sterile, your skin needs to be as free of germs as possible.  You can reduce the number of germs on your skin by washing with CHG (chlorahexidine gluconate) soap before surgery.  CHG is an antiseptic cleaner which kills germs and bonds with the skin to continue  killing germs even after washing. Please DO NOT use if you have an allergy to CHG or antibacterial soaps.  If your skin becomes reddened/irritated stop using the CHG and inform your nurse when you arrive at Short Stay. Do not shave (including legs and underarms) for at least 48 hours prior to the first CHG shower.   Please follow these instructions carefully:  1.  Shower with CHG Soap the night before surgery and the  morning of Surgery.  2.  If you choose to wash your hair, wash your hair first as usual with your  normal  shampoo.  3.  After you shampoo, rinse your hair and body thoroughly to remove the  shampoo.                            4.  Use CHG as you would any other liquid soap.  You can apply chg directly  to the skin and wash                       Gently with a scrungie or clean washcloth.  5.  Apply the CHG Soap to your body ONLY FROM THE NECK DOWN.   Do not use on face/ open                           Wound or open sores. Avoid contact with eyes, ears mouth and genitals (private parts).                       Wash face,  Genitals (private parts) with your normal soap.             6.  Wash thoroughly, paying special attention to the area where your surgery  will be performed.  7.  Thoroughly rinse your body with warm water from the neck down.  8.  DO NOT shower/wash with your normal soap after using and rinsing off  the CHG Soap.                9.  Pat yourself dry with a clean towel.            10.  Wear clean pajamas.            11.  Place clean sheets on your bed the night of your first shower and do not  sleep with pets. Day of Surgery : Do not apply any lotions/deodorants the morning of surgery.  Please wear clean clothes to the hospital/surgery center.  FAILURE TO FOLLOW THESE INSTRUCTIONS MAY RESULT IN THE CANCELLATION OF YOUR SURGERY PATIENT SIGNATURE_________________________________  NURSE  SIGNATURE__________________________________  ________________________________________________________________________

## 2021-05-14 ENCOUNTER — Other Ambulatory Visit: Payer: Self-pay

## 2021-05-14 ENCOUNTER — Encounter (HOSPITAL_COMMUNITY): Payer: Self-pay

## 2021-05-14 ENCOUNTER — Encounter (HOSPITAL_COMMUNITY)
Admission: RE | Admit: 2021-05-14 | Discharge: 2021-05-14 | Disposition: A | Discharge status: Home / Self Care | Payer: Medicare Other | Source: Ambulatory Visit | Attending: General Surgery | Admitting: General Surgery

## 2021-05-14 VITALS — BP 165/77 | HR 63 | Temp 98.2°F | Resp 20 | Ht 60.0 in | Wt 177.0 lb

## 2021-05-14 DIAGNOSIS — Z7901 Long term (current) use of anticoagulants: Secondary | ICD-10-CM | POA: Insufficient documentation

## 2021-05-14 DIAGNOSIS — E119 Type 2 diabetes mellitus without complications: Secondary | ICD-10-CM | POA: Insufficient documentation

## 2021-05-14 DIAGNOSIS — Z01818 Encounter for other preprocedural examination: Secondary | ICD-10-CM | POA: Insufficient documentation

## 2021-05-14 DIAGNOSIS — I480 Paroxysmal atrial fibrillation: Secondary | ICD-10-CM

## 2021-05-14 LAB — HEMOGLOBIN A1C
Hgb A1c MFr Bld: 6.4 % — ABNORMAL HIGH (ref 4.8–5.6)
Mean Plasma Glucose: 136.98 mg/dL

## 2021-05-14 LAB — CBC WITH DIFFERENTIAL/PLATELET
Abs Immature Granulocytes: 0.02 10*3/uL (ref 0.00–0.07)
Basophils Absolute: 0.1 10*3/uL (ref 0.0–0.1)
Basophils Relative: 1 %
Eosinophils Absolute: 0.3 10*3/uL (ref 0.0–0.5)
Eosinophils Relative: 4 %
HCT: 41.9 % (ref 36.0–46.0)
Hemoglobin: 13.9 g/dL (ref 12.0–15.0)
Immature Granulocytes: 0 %
Lymphocytes Relative: 32 %
Lymphs Abs: 2.7 10*3/uL (ref 0.7–4.0)
MCH: 31.4 pg (ref 26.0–34.0)
MCHC: 33.2 g/dL (ref 30.0–36.0)
MCV: 94.6 fL (ref 80.0–100.0)
Monocytes Absolute: 0.8 10*3/uL (ref 0.1–1.0)
Monocytes Relative: 10 %
Neutro Abs: 4.5 10*3/uL (ref 1.7–7.7)
Neutrophils Relative %: 53 %
Platelets: 225 10*3/uL (ref 150–400)
RBC: 4.43 MIL/uL (ref 3.87–5.11)
RDW: 12.4 % (ref 11.5–15.5)
WBC: 8.4 10*3/uL (ref 4.0–10.5)
nRBC: 0 % (ref 0.0–0.2)

## 2021-05-14 LAB — BASIC METABOLIC PANEL
Anion gap: 9 (ref 5–15)
BUN: 20 mg/dL (ref 8–23)
CO2: 25 mmol/L (ref 22–32)
Calcium: 9.4 mg/dL (ref 8.9–10.3)
Chloride: 105 mmol/L (ref 98–111)
Creatinine, Ser: 0.88 mg/dL (ref 0.44–1.00)
GFR, Estimated: >60 mL/min (ref >60)
Glucose, Bld: 132 mg/dL — ABNORMAL HIGH (ref 70–99)
Potassium: 4.5 mmol/L (ref 3.5–5.1)
Sodium: 139 mmol/L (ref 135–145)

## 2021-05-14 LAB — GLUCOSE, CAPILLARY: Glucose-Capillary: 128 mg/dL — ABNORMAL HIGH (ref 70–99)

## 2021-05-14 NOTE — Progress Notes (Addendum)
COVID test- 05/27/21   PCP - Dr. Hart Carwin Cardiologist - Dr. Erlene Quan  Chest x-ray - no EKG - requested Stress Test - 2017 ECHO - 09/19/20-epic Cardiac Cath - no Pacemaker/ICD device last checked:NA  Sleep Study - no CPAP - no  Fasting Blood Sugar - 120-140 Checks Blood Sugar _____ times a day once a month maybe  Blood Thinner Instructions:Coumadin/ Dr. Jeannetta Nap Aspirin Instructions:Stop 5 days prior to DOS/ Dr. Andrey Campanile and Dr. Jeannetta Nap Last Dose:10/28  Anesthesia review: yes  Patient denies shortness of breath, fever, cough and chest pain at PAT appointment Pt is in and out of A-Fib. She sometimes gets SOB with exertion but is better while taking sotalol.  Patient verbalized understanding of instructions that were given to them at the PAT appointment. Patient was also instructed that they will need to review over the PAT instructions again at home before surgery. yes

## 2021-05-15 ENCOUNTER — Encounter (HOSPITAL_COMMUNITY): Payer: Self-pay

## 2021-05-15 NOTE — Anesthesia Preprocedure Evaluation (Addendum)
Anesthesia Evaluation  Patient identified by MRN, date of birth, ID band Patient awake    Reviewed: Allergy & Precautions, NPO status , Patient's Chart, lab work & pertinent test results  Airway Mallampati: II  TM Distance: >3 FB Neck ROM: Full    Dental no notable dental hx. (+) Partial Upper, Partial Lower, Missing, Dental Advisory Given, Chipped   Pulmonary asthma ,    Pulmonary exam normal breath sounds clear to auscultation       Cardiovascular hypertension, Pt. on home beta blockers and Pt. on medications + dysrhythmias Atrial Fibrillation  Rhythm:Regular Rate:Normal  Echo 2.2022 1. Left ventricular ejection fraction, by estimation, is 55 to 60%. The left ventricle has normal function. The left ventricle has no regional wall motion abnormalities. Left ventricular diastolic parameters are indeterminate.  2. Right ventricular systolic function is normal. The right ventricular size is mildly enlarged. There is mildly elevated pulmonary artery systolic pressure.  3. Left atrial size was mildly dilated.  4. The mitral valve is normal in structure. Trivial mitral valve regurgitation. No evidence of mitral stenosis. Moderate mitral annular calcification.  5. The aortic valve is tricuspid. Aortic valve regurgitation is not visualized. Mild to moderate aortic valve stenosis. Aortic valve area, by VTI measures 1.15 cm. Aortic valve mean gradient measures 15.0 mmHg.  6. The inferior vena cava is normal in size with greater than 50% respiratory variability, suggesting right atrial pressure of 3 mmHg.  7. The patient appears to be in atrial fibrillation.    Neuro/Psych Seizures -,     GI/Hepatic negative GI ROS, Neg liver ROS,   Endo/Other  diabetes  Renal/GU negative Renal ROS     Musculoskeletal  (+) Arthritis ,   Abdominal (+) + obese,   Peds  Hematology negative hematology ROS (+)   Anesthesia Other Findings    Reproductive/Obstetrics                                                            Anesthesia Evaluation  Patient identified by MRN, date of birth, ID band Patient awake    Reviewed: Allergy & Precautions, NPO status , Patient's Chart, lab work & pertinent test results  Airway Mallampati: II  TM Distance: >3 FB Neck ROM: Full    Dental  (+) Teeth Intact, Caps, Partial Upper   Pulmonary neg pulmonary ROS,    Pulmonary exam normal breath sounds clear to auscultation       Cardiovascular hypertension, Pt. on medications and Pt. on home beta blockers Normal cardiovascular exam+ dysrhythmias Atrial Fibrillation + Valvular Problems/Murmurs AS  Rhythm:Irregular Rate:Normal + Systolic murmurs AS- moderate Gradient RBBB   Neuro/Psych negative neurological ROS  negative psych ROS   GI/Hepatic negative GI ROS, Neg liver ROS,   Endo/Other  diabetes, Well Controlled, Type 2, Oral Hypoglycemic AgentsObesity Hyperlipidemia  Renal/GU negative Renal ROS  negative genitourinary   Musculoskeletal negative musculoskeletal ROS (+)   Abdominal (+) + obese,   Peds  Hematology On Coumadin   Anesthesia Other Findings   Reproductive/Obstetrics                             Anesthesia Physical Anesthesia Plan  ASA: III  Anesthesia Plan: General   Post-op Pain Management:  Induction: Intravenous  Airway Management Planned: Mask and Natural Airway  Additional Equipment:   Intra-op Plan:   Post-operative Plan:   Informed Consent: I have reviewed the patients History and Physical, chart, labs and discussed the procedure including the risks, benefits and alternatives for the proposed anesthesia with the patient or authorized representative who has indicated his/her understanding and acceptance.   Dental advisory given  Plan Discussed with: CRNA, Anesthesiologist and Surgeon  Anesthesia Plan Comments:          Anesthesia Quick Evaluation  Anesthesia Physical Anesthesia Plan  ASA: 3  Anesthesia Plan: General   Post-op Pain Management:    Induction: Intravenous  PONV Risk Score and Plan: 4 or greater and Ondansetron, Dexamethasone, Midazolam, Scopolamine patch - Pre-op, Treatment may vary due to age or medical condition and Diphenhydramine  Airway Management Planned: Oral ETT and Video Laryngoscope Planned  Additional Equipment:   Intra-op Plan:   Post-operative Plan: Extubation in OR  Informed Consent: I have reviewed the patients History and Physical, chart, labs and discussed the procedure including the risks, benefits and alternatives for the proposed anesthesia with the patient or authorized representative who has indicated his/her understanding and acceptance.     Dental advisory given  Plan Discussed with: CRNA  Anesthesia Plan Comments: (See APP note by Joslyn Hy, FNP )     Anesthesia Quick Evaluation

## 2021-05-15 NOTE — Progress Notes (Signed)
Anesthesia Chart Review:   Case: [id number] Date/Time: 05/29/21 0715   Procedure: LAPAROSCOPIC INCISIONAL HERNIA REPAIR WITH MESH   Anesthesia type: General   Pre-op diagnosis: INCISIONAL HERNIA   Location: [facility] ROOM 01 / [facility]   Surgeons: [name], MD       DISCUSSION: Pt is 75 years old with hx aortic stenosis, atrial fibrillation, HTN, DM, remote hx seizures   Pt to stop coumadin 5 days before surgery   VS: BP (!) 165/77   Pulse 63   Temp 36.8 C (Oral)   Resp 20   Ht 5' (1.524 m)   Wt 80.3 kg   SpO2 97%   BMI 34.57 kg/m   PROVIDERS: - PCP is Kaleen Mask, MD - Cardiologist is Nanetta Batty, MD. Cleared for surgery at acceptable risk by Georgie Chard, NP on 03/20/21  LABS: Labs reviewed: Acceptable for surgery. PT/PTT will be obtained day of surgery  (all labs ordered are listed, but only abnormal results are displayed)  Labs Reviewed  HEMOGLOBIN A1C - Abnormal; Notable for the following components:      Result Value   Hgb A1c MFr Bld 6.4 (*)    All other components within normal limits  BASIC METABOLIC PANEL - Abnormal; Notable for the following components:   Glucose, Bld 132 (*)    All other components within normal limits  GLUCOSE, CAPILLARY - Abnormal; Notable for the following components:   Glucose-Capillary 128 (*)    All other components within normal limits  CBC WITH DIFFERENTIAL/PLATELET    EKG 03/26/21 (PCP's office): atrial fibrillation with normal mean ventricular response. RBBB.    CV: Echo 09/19/20:  1. Left ventricular ejection fraction, by estimation, is 55 to 60%. The left ventricle has normal function. The left ventricle has no regional wall motion abnormalities. Left ventricular diastolic parameters are indeterminate.  2. Right ventricular systolic function is normal. The right ventricular size is mildly enlarged. There is mildly elevated pulmonary artery systolic pressure.  3. Left atrial size was mildly dilated.  4. The  mitral valve is normal in structure. Trivial mitral valve regurgitation. No evidence of mitral stenosis. Moderate mitral annular calcification.  5. The aortic valve is tricuspid. Aortic valve regurgitation is not visualized. Mild to moderate aortic valve stenosis. Aortic valve area, by VTI measures 1.15 cm. Aortic valve mean gradient measures 15.0 mmHg.  6. The inferior vena cava is normal in size with greater than 50% respiratory variability, suggesting right atrial pressure of 3 mmHg.  7. The patient appears to be in atrial fibrillation.    Carotid US 07/31/15:  - Heterogeneous plaque, bilaterally. - 1-39% bilateral ICA stenosis. - Normal subclavian arteries, bilaterally. - Patent vertebral arteries with antegrade flow.  Nuclear stress test 07/31/15:  The left ventricular ejection fraction is hyperdynamic (>65%). Nuclear stress EF: 74%. The study is normal. This is a low risk study.  Past Medical History:  Diagnosis Date   Arthritis    "hands" (04/06/2017)   Asthma    mild with exertion   Atrial fibrillation with RVR (HCC)    Chronic back pain    "from the DDD"   DDD (degenerative disc disease), cervical    C-5-6 and C6-7 are partially herniated   DDD (degenerative disc disease), lumbar    L4-5 and L5-6  and S-1 are herniated discs   DDD (degenerative disc disease), thoracic    "not dx'd yet" (04/06/2017)   Hyperlipidemia    statin intolerant   Hypertension  Moderate aortic stenosis    Right bundle branch block    Seizures (HCC)    "when I was younger; none since the 42s" (04/06/2017)   Type II diabetes mellitus (HCC)     Past Surgical History:  Procedure Laterality Date   CARDIOVERSION N/A 12/17/2016   Procedure: CARDIOVERSION;  Surgeon: Nahser, Deloris Ping, MD;  Location: [facility];  Service: Cardiovascular;  Laterality: N/A;   DIAGNOSTIC LAPAROSCOPY  1993   "paraovarian cyst removed"   TONSILLECTOMY     VAGINAL HYSTERECTOMY  1999   "partial"     MEDICATIONS:  acetaminophen (TYLENOL) 650 MG CR tablet   albuterol (PROVENTIL HFA;VENTOLIN HFA) 108 (90 Base) MCG/ACT inhaler   atorvastatin (LIPITOR) 20 MG tablet   citalopram (CELEXA) 20 MG tablet   ibuprofen (ADVIL) 200 MG tablet   losartan (COZAAR) 100 MG tablet   Magnesium 250 MG TABS   metFORMIN (GLUCOPHAGE) 1000 MG tablet   Nutritional Supplements (JUICE PLUS FIBRE PO)   Nutritional Supplements (JUICE PLUS FIBRE PO)   Nutritional Supplements (JUICE PLUS FIBRE PO)   PHENobarbital (LUMINAL) 97.2 MG tablet   polyvinyl alcohol (ARTIFICIAL TEARS) 1.4 % ophthalmic solution   Simethicone 180 MG CAPS   sotalol (BETAPACE) 120 MG tablet   warfarin (COUMADIN) 5 MG tablet   No current facility-administered medications for this encounter.    If no changes, I anticipate pt can proceed with surgery as scheduled.   Rica Mast, PhD, FNP-BC [facility] Short Stay Surgical Center/Anesthesiology Phone: [phone] 05/15/2021 12:47 PM

## 2021-05-27 ENCOUNTER — Other Ambulatory Visit: Payer: Self-pay | Admitting: General Surgery

## 2021-05-28 LAB — SARS CORONAVIRUS 2 (TAT 6-24 HRS): SARS Coronavirus 2: NEGATIVE

## 2021-05-29 ENCOUNTER — Ambulatory Visit (HOSPITAL_COMMUNITY): Payer: Medicare Other | Admitting: Emergency Medicine

## 2021-05-29 ENCOUNTER — Encounter (HOSPITAL_COMMUNITY)
Admission: RE | Disposition: A | Discharge status: Home / Self Care | Payer: Self-pay | Source: Ambulatory Visit | Attending: General Surgery

## 2021-05-29 ENCOUNTER — Observation Stay (HOSPITAL_COMMUNITY)
Admission: RE | Admit: 2021-05-29 | Discharge: 2021-05-30 | Disposition: A | Discharge status: Home / Self Care | Payer: Medicare Other | Source: Ambulatory Visit | Attending: General Surgery | Admitting: General Surgery

## 2021-05-29 ENCOUNTER — Other Ambulatory Visit: Payer: Self-pay

## 2021-05-29 ENCOUNTER — Ambulatory Visit (HOSPITAL_COMMUNITY): Payer: Medicare Other | Admitting: Certified Registered Nurse Anesthetist

## 2021-05-29 DIAGNOSIS — I10 Essential (primary) hypertension: Secondary | ICD-10-CM | POA: Diagnosis not present

## 2021-05-29 DIAGNOSIS — Z7901 Long term (current) use of anticoagulants: Secondary | ICD-10-CM | POA: Insufficient documentation

## 2021-05-29 DIAGNOSIS — J45909 Unspecified asthma, uncomplicated: Secondary | ICD-10-CM | POA: Insufficient documentation

## 2021-05-29 DIAGNOSIS — Z8719 Personal history of other diseases of the digestive system: Secondary | ICD-10-CM

## 2021-05-29 DIAGNOSIS — E119 Type 2 diabetes mellitus without complications: Secondary | ICD-10-CM | POA: Diagnosis not present

## 2021-05-29 DIAGNOSIS — K436 Other and unspecified ventral hernia with obstruction, without gangrene: Secondary | ICD-10-CM | POA: Diagnosis present

## 2021-05-29 DIAGNOSIS — I480 Paroxysmal atrial fibrillation: Secondary | ICD-10-CM | POA: Insufficient documentation

## 2021-05-29 HISTORY — PX: INCISIONAL HERNIA REPAIR: SHX193

## 2021-05-29 LAB — APTT: aPTT: 28 seconds (ref 24–36)

## 2021-05-29 LAB — GLUCOSE, CAPILLARY
Glucose-Capillary: 123 mg/dL — ABNORMAL HIGH (ref 70–99)
Glucose-Capillary: 164 mg/dL — ABNORMAL HIGH (ref 70–99)
Glucose-Capillary: 143 mg/dL — ABNORMAL HIGH (ref 70–99)
Glucose-Capillary: 141 mg/dL — ABNORMAL HIGH (ref 70–99)

## 2021-05-29 LAB — PROTIME-INR
INR: 1 (ref 0.8–1.2)
Prothrombin Time: 13.1 seconds (ref 11.4–15.2)

## 2021-05-29 SURGERY — REPAIR, HERNIA, INCISIONAL, LAPAROSCOPIC
Anesthesia: General | Site: Abdomen

## 2021-05-29 MED ORDER — WARFARIN SODIUM 5 MG PO TABS
15.0000 mg | ORAL_TABLET | Freq: Once | ORAL | Status: AC
  Administered 2021-05-29: 15 mg via ORAL
  Filled 2021-05-29: qty 3
  Filled 2021-05-29: qty 2

## 2021-05-29 MED ORDER — DEXAMETHASONE SODIUM PHOSPHATE 4 MG/ML IJ SOLN
INTRAMUSCULAR | Status: DC | PRN
  Administered 2021-05-29: 5 mg via INTRAVENOUS

## 2021-05-29 MED ORDER — FENTANYL CITRATE (PF) 100 MCG/2ML IJ SOLN
INTRAMUSCULAR | Status: DC | PRN
  Administered 2021-05-29 (×5): 50 ug via INTRAVENOUS

## 2021-05-29 MED ORDER — PHENOBARBITAL 97.2 MG PO TABS
97.2000 mg | ORAL_TABLET | Freq: Every day | ORAL | Status: DC
  Administered 2021-05-29: 97.2 mg via ORAL
  Filled 2021-05-29: qty 1

## 2021-05-29 MED ORDER — CHLORHEXIDINE GLUCONATE CLOTH 2 % EX PADS: 6.0000 | MEDICATED_PAD | Freq: Once | CUTANEOUS | Status: DC

## 2021-05-29 MED ORDER — PROPOFOL 10 MG/ML IV BOLUS
INTRAVENOUS | Status: DC | PRN
  Administered 2021-05-29: 50 mg via INTRAVENOUS
  Administered 2021-05-29: 120 mg via INTRAVENOUS

## 2021-05-29 MED ORDER — BUPIVACAINE LIPOSOME 1.3 % IJ SUSP
20.0000 mL | Freq: Once | INTRAMUSCULAR | Status: AC
  Administered 2021-05-29: 20 mL

## 2021-05-29 MED ORDER — ACETAMINOPHEN 500 MG PO TABS
1000.0000 mg | ORAL_TABLET | ORAL | Status: AC
  Administered 2021-05-29: 1000 mg via ORAL
  Filled 2021-05-29: qty 2

## 2021-05-29 MED ORDER — ROCURONIUM BROMIDE 10 MG/ML (PF) SYRINGE
PREFILLED_SYRINGE | INTRAVENOUS | Status: AC
  Filled 2021-05-29: qty 10

## 2021-05-29 MED ORDER — POLYVINYL ALCOHOL 1.4 % OP SOLN
1.0000 [drp] | OPHTHALMIC | Status: DC | PRN
  Filled 2021-05-29: qty 15

## 2021-05-29 MED ORDER — MORPHINE SULFATE (PF) 2 MG/ML IV SOLN
1.0000 mg | INTRAVENOUS | Status: DC | PRN
  Administered 2021-05-29: 2 mg via INTRAVENOUS
  Filled 2021-05-29: qty 1

## 2021-05-29 MED ORDER — ONDANSETRON 4 MG PO TBDP: 4.0000 mg | ORAL_TABLET | Freq: Four times a day (QID) | ORAL | Status: DC | PRN

## 2021-05-29 MED ORDER — TRAMADOL HCL 50 MG PO TABS
50.0000 mg | ORAL_TABLET | Freq: Four times a day (QID) | ORAL | Status: DC | PRN
  Administered 2021-05-30: 50 mg via ORAL
  Filled 2021-05-29: qty 1

## 2021-05-29 MED ORDER — ORAL CARE MOUTH RINSE: 15.0000 mL | Freq: Once | OROMUCOSAL | Status: AC

## 2021-05-29 MED ORDER — DROPERIDOL 2.5 MG/ML IJ SOLN: 0.6250 mg | Freq: Once | INTRAMUSCULAR | Status: DC | PRN

## 2021-05-29 MED ORDER — EPHEDRINE SULFATE-NACL 50-0.9 MG/10ML-% IV SOSY
PREFILLED_SYRINGE | INTRAVENOUS | Status: DC | PRN
  Administered 2021-05-29: 5 mg via INTRAVENOUS

## 2021-05-29 MED ORDER — ENOXAPARIN SODIUM 40 MG/0.4ML IJ SOSY
40.0000 mg | PREFILLED_SYRINGE | INTRAMUSCULAR | Status: DC
  Administered 2021-05-30: 40 mg via SUBCUTANEOUS
  Filled 2021-05-29 (×2): qty 0.4

## 2021-05-29 MED ORDER — 0.9 % SODIUM CHLORIDE (POUR BTL) OPTIME
TOPICAL | Status: DC | PRN
  Administered 2021-05-29: 1000 mL

## 2021-05-29 MED ORDER — KETOROLAC TROMETHAMINE 30 MG/ML IJ SOLN
INTRAMUSCULAR | Status: AC
  Filled 2021-05-29: qty 1

## 2021-05-29 MED ORDER — CHLORHEXIDINE GLUCONATE 0.12 % MT SOLN
15.0000 mL | Freq: Once | OROMUCOSAL | Status: AC
  Administered 2021-05-29: 15 mL via OROMUCOSAL

## 2021-05-29 MED ORDER — MIDAZOLAM HCL 2 MG/2ML IJ SOLN
INTRAMUSCULAR | Status: AC
  Filled 2021-05-29: qty 2

## 2021-05-29 MED ORDER — LOSARTAN POTASSIUM 50 MG PO TABS
50.0000 mg | ORAL_TABLET | Freq: Every day | ORAL | Status: DC
  Administered 2021-05-29: 50 mg via ORAL
  Filled 2021-05-29: qty 1

## 2021-05-29 MED ORDER — METFORMIN HCL 500 MG PO TABS
500.0000 mg | ORAL_TABLET | Freq: Two times a day (BID) | ORAL | Status: DC
  Administered 2021-05-29 – 2021-05-30 (×2): 500 mg via ORAL
  Filled 2021-05-29 (×2): qty 1

## 2021-05-29 MED ORDER — FENTANYL CITRATE (PF) 100 MCG/2ML IJ SOLN
INTRAMUSCULAR | Status: AC
  Filled 2021-05-29: qty 2

## 2021-05-29 MED ORDER — DIPHENHYDRAMINE HCL 12.5 MG/5ML PO ELIX: 12.5000 mg | ORAL_SOLUTION | Freq: Four times a day (QID) | ORAL | Status: DC | PRN

## 2021-05-29 MED ORDER — WARFARIN - PHARMACIST DOSING INPATIENT: Freq: Every day | Status: DC

## 2021-05-29 MED ORDER — MAGNESIUM 250 MG PO TABS: 250.0000 mg | ORAL_TABLET | Freq: Every day | ORAL | Status: DC

## 2021-05-29 MED ORDER — MEPERIDINE HCL 50 MG/ML IJ SOLN: 6.2500 mg | INTRAMUSCULAR | Status: DC | PRN

## 2021-05-29 MED ORDER — ACETAMINOPHEN 500 MG PO TABS
1000.0000 mg | ORAL_TABLET | Freq: Three times a day (TID) | ORAL | Status: DC
  Administered 2021-05-29 – 2021-05-30 (×3): 1000 mg via ORAL
  Filled 2021-05-29 (×3): qty 2

## 2021-05-29 MED ORDER — ALBUTEROL SULFATE (2.5 MG/3ML) 0.083% IN NEBU: 3.0000 mL | INHALATION_SOLUTION | Freq: Four times a day (QID) | RESPIRATORY_TRACT | Status: DC | PRN

## 2021-05-29 MED ORDER — SIMETHICONE 80 MG PO CHEW: 40.0000 mg | CHEWABLE_TABLET | Freq: Four times a day (QID) | ORAL | Status: DC | PRN

## 2021-05-29 MED ORDER — DIPHENHYDRAMINE HCL 50 MG/ML IJ SOLN
INTRAMUSCULAR | Status: AC
  Filled 2021-05-29: qty 1

## 2021-05-29 MED ORDER — KETOROLAC TROMETHAMINE 15 MG/ML IJ SOLN
INTRAMUSCULAR | Status: DC | PRN
  Administered 2021-05-29: 15 mg via INTRAVENOUS

## 2021-05-29 MED ORDER — MIDAZOLAM HCL 2 MG/2ML IJ SOLN
INTRAMUSCULAR | Status: DC | PRN
  Administered 2021-05-29: 2 mg via INTRAVENOUS

## 2021-05-29 MED ORDER — EPHEDRINE 5 MG/ML INJ
INTRAVENOUS | Status: AC
  Filled 2021-05-29: qty 5

## 2021-05-29 MED ORDER — ROCURONIUM BROMIDE 10 MG/ML (PF) SYRINGE
PREFILLED_SYRINGE | INTRAVENOUS | Status: DC | PRN
  Administered 2021-05-29: 50 mg via INTRAVENOUS
  Administered 2021-05-29: 20 mg via INTRAVENOUS

## 2021-05-29 MED ORDER — PANTOPRAZOLE SODIUM 40 MG IV SOLR
40.0000 mg | Freq: Every day | INTRAVENOUS | Status: DC
  Administered 2021-05-29: 40 mg via INTRAVENOUS
  Filled 2021-05-29: qty 40

## 2021-05-29 MED ORDER — SENNA 8.6 MG PO TABS
1.0000 | ORAL_TABLET | Freq: Two times a day (BID) | ORAL | Status: DC
  Administered 2021-05-29 – 2021-05-30 (×3): 8.6 mg via ORAL
  Filled 2021-05-29 (×3): qty 1

## 2021-05-29 MED ORDER — SOTALOL HCL 120 MG PO TABS
120.0000 mg | ORAL_TABLET | Freq: Two times a day (BID) | ORAL | Status: DC
  Administered 2021-05-29 – 2021-05-30 (×2): 120 mg via ORAL
  Filled 2021-05-29 (×3): qty 1

## 2021-05-29 MED ORDER — ONDANSETRON HCL 4 MG/2ML IJ SOLN
INTRAMUSCULAR | Status: AC
  Filled 2021-05-29: qty 2

## 2021-05-29 MED ORDER — LIDOCAINE HCL 2 % IJ SOLN
INTRAMUSCULAR | Status: AC
  Filled 2021-05-29: qty 20

## 2021-05-29 MED ORDER — DIPHENHYDRAMINE HCL 50 MG/ML IJ SOLN: 12.5000 mg | Freq: Four times a day (QID) | INTRAMUSCULAR | Status: DC | PRN

## 2021-05-29 MED ORDER — HYDROMORPHONE HCL 1 MG/ML IJ SOLN: 0.2500 mg | INTRAMUSCULAR | Status: DC | PRN

## 2021-05-29 MED ORDER — LACTATED RINGERS IV SOLN: INTRAVENOUS | Status: DC

## 2021-05-29 MED ORDER — ONDANSETRON HCL 4 MG/2ML IJ SOLN
INTRAMUSCULAR | Status: DC | PRN
  Administered 2021-05-29: 4 mg via INTRAVENOUS

## 2021-05-29 MED ORDER — LIDOCAINE 2% (20 MG/ML) 5 ML SYRINGE
INTRAMUSCULAR | Status: DC | PRN
  Administered 2021-05-29: 80 mg via INTRAVENOUS

## 2021-05-29 MED ORDER — KETOROLAC TROMETHAMINE 15 MG/ML IJ SOLN
15.0000 mg | Freq: Three times a day (TID) | INTRAMUSCULAR | Status: DC
  Administered 2021-05-29 – 2021-05-30 (×4): 15 mg via INTRAVENOUS
  Filled 2021-05-29 (×4): qty 1

## 2021-05-29 MED ORDER — SUGAMMADEX SODIUM 200 MG/2ML IV SOLN
INTRAVENOUS | Status: DC | PRN
  Administered 2021-05-29: 200 mg via INTRAVENOUS

## 2021-05-29 MED ORDER — POTASSIUM CHLORIDE IN NACL 20-0.9 MEQ/L-% IV SOLN
INTRAVENOUS | Status: DC
  Filled 2021-05-29 (×2): qty 1000

## 2021-05-29 MED ORDER — BUPIVACAINE-EPINEPHRINE 0.5% -1:200000 IJ SOLN
INTRAMUSCULAR | Status: DC | PRN
  Administered 2021-05-29: 30 mL

## 2021-05-29 MED ORDER — SCOPOLAMINE 1 MG/3DAYS TD PT72
1.0000 | MEDICATED_PATCH | TRANSDERMAL | Status: DC
  Administered 2021-05-29: 1.5 mg via TRANSDERMAL
  Filled 2021-05-29: qty 1

## 2021-05-29 MED ORDER — DEXAMETHASONE SODIUM PHOSPHATE 10 MG/ML IJ SOLN
INTRAMUSCULAR | Status: AC
  Filled 2021-05-29: qty 1

## 2021-05-29 MED ORDER — CEFAZOLIN SODIUM-DEXTROSE 2-4 GM/100ML-% IV SOLN
2.0000 g | INTRAVENOUS | Status: AC
  Administered 2021-05-29: 2 g via INTRAVENOUS
  Filled 2021-05-29: qty 100

## 2021-05-29 MED ORDER — BUPIVACAINE LIPOSOME 1.3 % IJ SUSP
INTRAMUSCULAR | Status: AC
  Filled 2021-05-29: qty 20

## 2021-05-29 MED ORDER — MAGNESIUM OXIDE -MG SUPPLEMENT 400 (240 MG) MG PO TABS
200.0000 mg | ORAL_TABLET | Freq: Every day | ORAL | Status: DC
  Administered 2021-05-29 – 2021-05-30 (×2): 200 mg via ORAL
  Filled 2021-05-29 (×2): qty 1

## 2021-05-29 MED ORDER — PROPOFOL 500 MG/50ML IV EMUL
INTRAVENOUS | Status: AC
  Filled 2021-05-29: qty 50

## 2021-05-29 MED ORDER — ONDANSETRON HCL 4 MG/2ML IJ SOLN: 4.0000 mg | Freq: Four times a day (QID) | INTRAMUSCULAR | Status: DC | PRN

## 2021-05-29 MED ORDER — BUPIVACAINE-EPINEPHRINE (PF) 0.5% -1:200000 IJ SOLN
INTRAMUSCULAR | Status: AC
  Filled 2021-05-29: qty 30

## 2021-05-29 SURGICAL SUPPLY — 43 items
APPLIER CLIP LOGIC TI 5 (MISCELLANEOUS) IMPLANT
BAG COUNTER SPONGE SURGICOUNT (BAG) ×2 IMPLANT
BINDER ABDOMINAL 12 ML 46-62 (SOFTGOODS) ×2 IMPLANT
BLADE CLIPPER SURG (BLADE) IMPLANT
CABLE HIGH FREQUENCY MONO STRZ (ELECTRODE) ×2 IMPLANT
CANISTER SUCT 3000ML PPV (MISCELLANEOUS) IMPLANT
CHLORAPREP W/TINT 26 (MISCELLANEOUS) ×2 IMPLANT
COVER SURGICAL LIGHT HANDLE (MISCELLANEOUS) ×2 IMPLANT
DERMABOND ADVANCED (GAUZE/BANDAGES/DRESSINGS) ×1
DERMABOND ADVANCED .7 DNX12 (GAUZE/BANDAGES/DRESSINGS) ×1 IMPLANT
DEVICE SECURE STRAP 25 ABSORB (INSTRUMENTS) ×2 IMPLANT
DEVICE TROCAR PUNCTURE CLOSURE (ENDOMECHANICALS) ×2 IMPLANT
ELECT REM PT RETURN 15FT ADLT (MISCELLANEOUS) ×2 IMPLANT
GLOVE SURG ENC TEXT LTX SZ7.5 (GLOVE) ×2 IMPLANT
GLOVE SURG UNDER LTX SZ8 (GLOVE) ×2 IMPLANT
GOWN STRL REUS W/ TWL LRG LVL3 (GOWN DISPOSABLE) ×2 IMPLANT
GOWN STRL REUS W/TWL 2XL LVL3 (GOWN DISPOSABLE) ×2 IMPLANT
GOWN STRL REUS W/TWL LRG LVL3 (GOWN DISPOSABLE) ×4
GRASPER SUT TROCAR 14GX15 (MISCELLANEOUS) ×2 IMPLANT
KIT BASIN OR (CUSTOM PROCEDURE TRAY) ×2 IMPLANT
KIT TURNOVER KIT A (KITS) ×2 IMPLANT
MARKER SKIN DUAL TIP RULER LAB (MISCELLANEOUS) ×2 IMPLANT
MESH VENTRALIGHT ST 8IN CRC (Mesh General) ×2 IMPLANT
NEEDLE SPNL 22GX3.5 QUINCKE BK (NEEDLE) ×2 IMPLANT
NS IRRIG 1000ML POUR BTL (IV SOLUTION) ×2 IMPLANT
PAD ARMBOARD 7.5X6 YLW CONV (MISCELLANEOUS) ×4 IMPLANT
PENCIL SMOKE EVACUATOR (MISCELLANEOUS) ×2 IMPLANT
SCISSORS LAP 5X35 DISP (ENDOMECHANICALS) ×2 IMPLANT
SET IRRIG TUBING LAPAROSCOPIC (IRRIGATION / IRRIGATOR) IMPLANT
SET TUBE SMOKE EVAC HIGH FLOW (TUBING) ×2 IMPLANT
SHEARS HARMONIC ACE PLUS 36CM (ENDOMECHANICALS) ×2 IMPLANT
SLEEVE ENDOPATH XCEL 5M (ENDOMECHANICALS) ×2 IMPLANT
SUT MNCRL AB 4-0 PS2 18 (SUTURE) ×2 IMPLANT
SUT NOVA NAB DX-16 0-1 5-0 T12 (SUTURE) ×2 IMPLANT
SUT VIC AB 3-0 SH 18 (SUTURE) ×2 IMPLANT
TOWEL OR 17X26 10 PK STRL BLUE (TOWEL DISPOSABLE) ×2 IMPLANT
TOWEL OR NON WOVEN STRL DISP B (DISPOSABLE) ×2 IMPLANT
TRAY FOLEY MTR SLVR 16FR STAT (SET/KITS/TRAYS/PACK) ×2 IMPLANT
TRAY LAPAROSCOPIC (CUSTOM PROCEDURE TRAY) ×2 IMPLANT
TROCAR XCEL BLUNT TIP 100MML (ENDOMECHANICALS) IMPLANT
TROCAR XCEL NON-BLD 11X100MML (ENDOMECHANICALS) ×2 IMPLANT
TROCAR XCEL NON-BLD 5MMX100MML (ENDOMECHANICALS) ×2 IMPLANT
WATER STERILE IRR 1000ML POUR (IV SOLUTION) ×2 IMPLANT

## 2021-05-29 NOTE — Anesthesia Procedure Notes (Signed)
Procedure Name: Intubation Date/Time: 05/29/2021 7:45 AM Performed by: Vanessa [place], CRNA Pre-anesthesia Checklist: Emergency Drugs available, Suction available, Patient identified and Patient being monitored Patient Re-evaluated:Patient Re-evaluated prior to induction Oxygen Delivery Method: Circle system utilized Preoxygenation: Pre-oxygenation with 100% oxygen Induction Type: IV induction Ventilation: Mask ventilation without difficulty Laryngoscope Size: Glidescope and 3 Grade View: Grade I Tube type: Oral Tube size: 7.0 mm Number of attempts: 1 Airway Equipment and Method: Video-laryngoscopy Placement Confirmation: ETT inserted through vocal cords under direct vision, positive ETCO2 and breath sounds checked- equal and bilateral Secured at: 21 cm Tube secured with: Tape Dental Injury: Teeth and Oropharynx as per pre-operative assessment  Difficulty Due To: Difficult Airway- due to reduced neck mobility, Difficulty was anticipated and Difficult Airway- due to dentition

## 2021-05-29 NOTE — Op Note (Addendum)
Laparoscopic  Incarcerated Ventral Incisional Hernia Repair with mesh Procedure Note; laparoscopic bilateral TAP block  Indications: Symptomatic incarcerated ventral hernia  Pre-operative Diagnosis: incarcerated ventral incisional hernia  Post-operative Diagnosis: incarcerated fat containing ventral incisional hernia  Surgeon: Greer Pickerel MD FACS  Assistants: none  Anesthesia: General endotracheal anesthesia  Procedure Details  The patient was seen in the Holding Room. The risks, benefits, complications, treatment options, and expected outcomes were discussed with the patient. The possibilities of reaction to medication, pulmonary aspiration, perforation of viscus, bleeding, recurrent infection, the need for additional procedures, failure to diagnose a condition, and creating a complication requiring transfusion or operation were discussed with the patient. The patient concurred with the proposed plan, giving informed consent.  The site of surgery properly noted/marked. The patient was taken to the operating room, identified as Julia Rose and the procedure verified as laparoscopic ventral hernia repair with mesh. A Time Out was held and the above information confirmed.    The patient was placed supine.  After establishing general anesthesia, a Foley catheter was placed.  The abdomen was prepped with Chloraprep and draped in standard fashion.  A 5 mm Optiview was used the cannulate the peritoneal cavity in the left upper quadrant below the costal margin.  Pneumoperitoneum was obtained by insufflating CO2, maintaining a maximum pressure of 15 mmHg.  The 5 mm 30-degree laparoscopic was inserted.  There were no significant omental adhesions to the anterior abdominal wall in and around the hernia defect.  I inspected the Optiview entry site and there is no evidence of injury to surrounding structures.  The patient had pretty much her entire omentum incarcerated in her periumbilical fascial defect.   An 11-mm port was placed in the left anterior axillary line at the level of the umbilicus.  Another 5-mm port was placed in the left lower quadrant.  I was able to reduce the incarcerated omentum using 2 Prestige graspers.  Harmonic scalpel was used to obtain hemostasis from where there was some mild oozing from some of the reduced omentum.  I also used the harmonic scalpel to take down the falciform ligament.  There were no other fascial defects in the abdominal wall except for the periumbilical fascial defect.  I performed a bilateral laparoscopic tap block for postoperative pain relief.  We used a spinal needle to identify the extent of the hernia defect.  This covered an area of about 5 x5 cms.  We selected a 20 cm round piece of Bard ventral Lex ST mesh.  We placed 4 stay sutures of 0 Novofil around the edges of the mesh.  The mesh was then rolled up and inserted through the 11 mm port site.  The mesh was then unrolled.  The stay sutures were then pulled up through small stab incisions using the Endo-close device.  This deployed the mesh widely over the fascial defects.  The stay sutures were then tied down.  The Secure Strap device was then used to tack down the edges of the mesh at 1 cm intervals circumferentially.  We placed a few tacks inside the outer ring of tacks.  We inspected for hemostasis.  The fascial defect at the 11 mm port site was closed with a 0 Vicryl using the Endoclose device.  Pneumoperitoneum was then released as we removed the remainder of the trocars.  The port sites were closed with 4-0 Monocryl.  All of the incisions and stay suture sites were then sealed with Dermabond.  An abdominal binder  was placed around the patient's abdomen.  The patient was extubated and brought to the recovery room in stable condition.  All sponge, instrument, and needle counts were correct prior to closure and at the conclusion of the case.   Findings: Type of repair - mesh underlay (IPOM)    Name of  mesh - bard ventralightST  Size of mesh - 20cm round  Mesh overlap - >5 cm  Placement of mesh -  beneath fascia and into peritoneal cavity  Estimated Blood Loss:  Minimal         Complications:  None; patient tolerated the procedure well.         Disposition: PACU - hemodynamically stable.         Condition: stable  Mary Sella. Andrey Campanile, MD, FACS General, Bariatric, & Minimally Invasive Surgery [facility], [place]

## 2021-05-29 NOTE — Transfer of Care (Signed)
Immediate Anesthesia Transfer of Care Note  Patient: Julia Rose  Procedure(s) Performed: LAPAROSCOPIC INCISIONAL HERNIA REPAIR WITH MESH (Abdomen)  Patient Location: PACU  Anesthesia Type:General  Level of Consciousness: awake and patient cooperative  Airway & Oxygen Therapy: Patient Spontanous Breathing and Patient connected to face mask  Post-op Assessment: Report given to RN and Post -op Vital signs reviewed and stable  Post vital signs: Reviewed and stable  Last Vitals:  Vitals Value Taken Time  BP 163/101 05/29/21 0910  Temp    Pulse 55 05/29/21 0913  Resp 17 05/29/21 0913  SpO2 98 % 05/29/21 0913  Vitals shown include unvalidated device data.  Last Pain:  Vitals:   05/29/21 0602  TempSrc: Oral         Complications: No notable events documented.

## 2021-05-29 NOTE — Progress Notes (Signed)
ANTICOAGULATION CONSULT NOTE  Pharmacy Consult for Warfarin Indication: atrial fibrillation  Allergies  Allergen Reactions   Dilantin [Phenytoin] Hives and Other (See Comments)    Whelps all over for two weeks   Statins Other (See Comments)    High dose statin - dizziness    Patient Measurements:    Vital Signs: Temp: 97.5 F (36.4 C) (11/03 0910) Temp Source: Oral (11/03 0602) BP: 157/91 (11/03 0930) Pulse Rate: 57 (11/03 0930)  Labs: Recent Labs    05/29/21 0515  APTT 28  LABPROT 13.1  INR 1.0    CrCl cannot be calculated (Unknown ideal weight.).   Medical History: Past Medical History:  Diagnosis Date   Arthritis    "hands" (04/06/2017)   Asthma    mild with exertion   Atrial fibrillation with RVR (HCC)    Chronic back pain    "from the DDD"   DDD (degenerative disc disease), cervical    C-5-6 and C6-7 are partially herniated   DDD (degenerative disc disease), lumbar    L4-5 and L5-6  and S-1 are herniated discs   DDD (degenerative disc disease), thoracic    "not dx'd yet" (04/06/2017)   Hyperlipidemia    statin intolerant   Hypertension    Moderate aortic stenosis    Right bundle branch block    Seizures (HCC)    "when I was younger; none since the 58s" (04/06/2017)   Type II diabetes mellitus (HCC)     Medications:  PTA warfarin dosing: 15 mg x 1 followed by 10 mg x 3 then repeat  Assessment: 75 y/o F on warfarin for atrial fibrillation held for hernia repair to resume per pharmacy postop. No significant drug interactions except phenobarbital (PTA med) which will be resumed postop as well.   Goal of Therapy:  INR 2-3   Plan:  Warfarin 15 mg x 1 Daily INR Recommend continuing PTA dosing upon discharge though no recent INR in EPIC except preop after holding warfarin   Luisa Hart D 05/29/2021,9:46 AM

## 2021-05-29 NOTE — H&P (Signed)
CC: here for surgery  Requesting provider: n/a  HPI: Julia Rose is an 75 y.o. female who is here for lap repair of incisional hernia with mesh. She denies any changes since seen in clinic. Has some herniated neck and lumbar discs.   Julia Rose is a 75 y.o. female who is seen today as an office consultation at the request of Dr. Pasty Arch for evaluation of Hernia .   I initially saw her back in 2016 for an incisional hernia. She had undergone a diagnostic laparoscopy years ago and shortly after that procedure developed a bulge. At that time a CT scan in 2016 showed a 3 x 3 cm fascial defect. She was relatively asymptomatic at that time. She states that she believes her hernia has gotten a little bit larger. She states it is now causing "trouble ". She states that it is hard at times and is getting harder to reduce. She states for years it causes no issues but now she can feel movement in it. She has a bowel movement every few days which is her baseline. She is on Coumadin for paroxysmal atrial fibrillation.  Comorbidities include atrial fibrillation, aortic stenosis, hypertension, arthritis, diabetes mellitus type 2, and a very remote history of seizures. Her last seizure was 30 years ago. No tobacco use. Lives with her husband.  Review of Systems: A complete review of systems was obtained from the patient. I have reviewed this information and discussed as appropriate with the patient. See HPI as well for other ROS.  Past Medical History:  Diagnosis Date   Arthritis    "hands" (04/06/2017)   Asthma    mild with exertion   Atrial fibrillation with RVR (HCC)    Chronic back pain    "from the DDD"   DDD (degenerative disc disease), cervical    C-5-6 and C6-7 are partially herniated   DDD (degenerative disc disease), lumbar    L4-5 and L5-6  and S-1 are herniated discs   DDD (degenerative disc disease), thoracic    "not dx'd yet" (04/06/2017)   Hyperlipidemia    statin intolerant    Hypertension    Moderate aortic stenosis    Right bundle branch block    Seizures ([place])    "when I was younger; none since the 28s" (04/06/2017)   Type II diabetes mellitus ([place])     Past Surgical History:  Procedure Laterality Date   CARDIOVERSION N/A 12/17/2016   Procedure: CARDIOVERSION;  Surgeon: Nahser, Wonda Cheng, MD;  Location: [facility];  Service: Cardiovascular;  Laterality: N/A;   DIAGNOSTIC LAPAROSCOPY  1993   "paraovarian cyst removed"   [place]   "partial"    Family History  Problem Relation Age of Onset   Kidney disease Mother    Diabetes Mother    Dementia Mother    Hypertension Mother     Social:  reports that she has never smoked. She has never used smokeless tobacco. She reports that she does not drink alcohol and does not use drugs.  Allergies:  Allergies  Allergen Reactions   Dilantin [Phenytoin] Hives and Other (See Comments)    Whelps all over for two weeks   Statins Other (See Comments)    High dose statin - dizziness    Medications: I have reviewed the patient's current medications.   ROS - all of the below systems have been reviewed with the patient and positives are indicated with bold text General: chills,  fever or night sweats Eyes: blurry vision or double vision ENT: epistaxis or sore throat Allergy/Immunology: itchy/watery eyes or nasal congestion Hematologic/Lymphatic: bleeding problems, blood clots or swollen lymph nodes Endocrine: temperature intolerance or unexpected weight changes Breast: new or changing breast lumps or nipple discharge Resp: cough, shortness of breath, or wheezing CV: chest pain or dyspnea on exertion GI: as per HPI GU: dysuria, trouble voiding, or hematuria MSK: joint pain or joint stiffness Neuro: TIA or stroke symptoms Derm: pruritus and skin lesion changes Psych: anxiety and depression  PE Blood pressure (!) 148/74, pulse (!) 56, temperature 97.6 F (36.4 C),  temperature source Oral, resp. rate 18, SpO2 98 %. Constitutional: NAD; conversant; no deformities Eyes: Moist conjunctiva; no lid lag; anicteric; PERRL Neck: Trachea midline; no thyromegaly Lungs: Normal respiratory effort; no tactile fremitus CV: RRR; no palpable thrills; no pitting edema GI: Abd soft, bulge; no palpable hepatosplenomegaly MSK: Normal gait; no clubbing/cyanosis Psychiatric: Appropriate affect; alert and oriented x3 Lymphatic: No palpable cervical or axillary lymphadenopathy Skin:no rash/jaundice/lesions  Results for orders placed or performed during the hospital encounter of 05/29/21 (from the past 48 hour(s))  Protime-INR     Status: None   Collection Time: 05/29/21  5:15 AM  Result Value Ref Range   Prothrombin Time 13.1 11.4 - 15.2 seconds   INR 1.0 0.8 - 1.2    Comment: (NOTE) INR goal varies based on device and disease states. Performed at [facility], [address] [place], [place] [address]   APTT     Status: None   Collection Time: 05/29/21  5:15 AM  Result Value Ref Range   aPTT 28 24 - 36 seconds    Comment: Performed at [facility], [address] [place], [place] [address]  Glucose, capillary     Status: Abnormal   Collection Time: 05/29/21  5:57 AM  Result Value Ref Range   Glucose-Capillary 123 (H) 70 - 99 mg/dL    Comment: Glucose reference range applies only to samples taken after fasting for at least 8 hours.   Comment 1 Notify RN    Comment 2 Document in Chart     No results found.  Imaging: reviewed  A/P: [name] is an 75 y.o. female with  Incisional hernia, without obstruction or gangrene  Aortic stenosis, moderate  Non-insulin treated type 2 diabetes mellitus (CMS-HCC)  Essential hypertension  PAF (paroxysmal atrial fibrillation) (CMS-HCC)  Anticoagulated on Coumadin  Stoppped coumadin on 10/28 - no bridge per cards team To OR for lap incisional hernia repair with  mesh All questions asked/answered IV abx ERAS  Mary Sella. Andrey Campanile, MD, FACS General, Bariatric, & Minimally Invasive Surgery [facility], [place]

## 2021-05-29 NOTE — Anesthesia Postprocedure Evaluation (Signed)
Anesthesia Post Note  Patient: Julia Rose  Procedure(s) Performed: LAPAROSCOPIC INCISIONAL HERNIA REPAIR WITH MESH (Abdomen)     Patient location during evaluation: PACU Anesthesia Type: General Level of consciousness: sedated and patient cooperative Pain management: pain level controlled Vital Signs Assessment: post-procedure vital signs reviewed and stable Respiratory status: spontaneous breathing Cardiovascular status: stable Anesthetic complications: no   No notable events documented.  Last Vitals:  Vitals:   05/29/21 1425 05/29/21 1759  BP: 136/66 136/64  Pulse: 65 64  Resp: 18 18  Temp: 36.8 C 36.9 C  SpO2: 90% 93%    Last Pain:  Vitals:   05/29/21 1759  TempSrc: Oral  PainSc:                  Lewie Loron

## 2021-05-30 ENCOUNTER — Encounter (HOSPITAL_COMMUNITY): Payer: Self-pay | Admitting: General Surgery

## 2021-05-30 DIAGNOSIS — K436 Other and unspecified ventral hernia with obstruction, without gangrene: Secondary | ICD-10-CM | POA: Diagnosis not present

## 2021-05-30 LAB — BASIC METABOLIC PANEL
Anion gap: 7 (ref 5–15)
BUN: 22 mg/dL (ref 8–23)
CO2: 28 mmol/L (ref 22–32)
Calcium: 9 mg/dL (ref 8.9–10.3)
Chloride: 102 mmol/L (ref 98–111)
Creatinine, Ser: 0.9 mg/dL (ref 0.44–1.00)
GFR, Estimated: >60 mL/min (ref >60)
Glucose, Bld: 131 mg/dL — ABNORMAL HIGH (ref 70–99)
Potassium: 4.4 mmol/L (ref 3.5–5.1)
Sodium: 137 mmol/L (ref 135–145)

## 2021-05-30 LAB — CBC
HCT: 35.5 % — ABNORMAL LOW (ref 36.0–46.0)
Hemoglobin: 11.7 g/dL — ABNORMAL LOW (ref 12.0–15.0)
MCH: 31.3 pg (ref 26.0–34.0)
MCHC: 33 g/dL (ref 30.0–36.0)
MCV: 94.9 fL (ref 80.0–100.0)
Platelets: 177 10*3/uL (ref 150–400)
RBC: 3.74 MIL/uL — ABNORMAL LOW (ref 3.87–5.11)
RDW: 12.5 % (ref 11.5–15.5)
WBC: 12.3 10*3/uL — ABNORMAL HIGH (ref 4.0–10.5)
nRBC: 0 % (ref 0.0–0.2)

## 2021-05-30 LAB — PROTIME-INR
INR: 1.1 (ref 0.8–1.2)
Prothrombin Time: 14.1 seconds (ref 11.4–15.2)

## 2021-05-30 MED ORDER — ACETAMINOPHEN 500 MG PO TABS: 1000.0000 mg | ORAL_TABLET | Freq: Three times a day (TID) | ORAL | 0 refills | Status: AC

## 2021-05-30 MED ORDER — WARFARIN SODIUM 5 MG PO TABS
15.0000 mg | ORAL_TABLET | Freq: Once | ORAL | Status: AC
  Administered 2021-05-30: 15 mg via ORAL
  Filled 2021-05-30: qty 3

## 2021-05-30 MED ORDER — TRAMADOL HCL 50 MG PO TABS: 50.0000 mg | ORAL_TABLET | Freq: Four times a day (QID) | ORAL | 0 refills | Status: AC | PRN

## 2021-05-30 NOTE — Discharge Instructions (Signed)
RESUME YOUR HOME COUMADIN REGIMEN YOU WILL NEED A COUMADIN/INR LEVEL CHECK NEXT WEEK!!!  [facility] LAPAROSCOPIC SURGERY: POST OP INSTRUCTIONS Always review your discharge instruction sheet given to you by the facility where your surgery was performed. IF YOU HAVE DISABILITY OR FAMILY LEAVE FORMS, YOU MUST BRING THEM TO THE OFFICE FOR PROCESSING.   DO NOT GIVE THEM TO YOUR DOCTOR.  PAIN CONTROL  First take acetaminophen (Tylenol) AND/or ibuprofen (Advil) to control your pain after surgery.  Follow directions on package.  Taking acetaminophen (Tylenol) and/or ibuprofen (Advil) regularly after surgery will help to control your pain and lower the amount of prescription pain medication you may need.  You should not take more than 3,000 mg (3 grams) of acetaminophen (Tylenol) in 24 hours.  You should not take ibuprofen (Advil), aleve, motrin, naprosyn or other NSAIDS if you have a history of stomach ulcers or chronic kidney disease.  A prescription for pain medication may be given to you upon discharge.  Take your pain medication as prescribed, if you still have uncontrolled pain after taking acetaminophen (Tylenol) or ibuprofen (Advil). Use ice packs to help control pain. If you need a refill on your pain medication, please contact your pharmacy.  They will contact our office to request authorization. Prescriptions will not be filled after 5pm or on week-ends.  HOME MEDICATIONS Take your usually prescribed medications unless otherwise directed.  DIET You should follow a light diet the first few days after arrival home.  Be sure to include lots of fluids daily. Avoid fatty, fried foods.   CONSTIPATION It is common to experience some constipation after surgery and if you are taking pain medication.  Increasing fluid intake and taking a stool softener (such as Colace) will usually help or prevent this problem from occurring.  A mild laxative (Milk of Magnesia or Miralax)  should be taken according to package instructions if there are no bowel movements after 48 hours.  WOUND/INCISION CARE Most patients will experience some swelling and bruising in the area of the incisions.  Ice packs will help.  Swelling and bruising can take several days to resolve.  Unless discharge instructions indicate otherwise, follow guidelines below  STERI-STRIPS - you may remove your outer bandages 48 hours after surgery, and you may shower at that time.  You have steri-strips (small skin tapes) in place directly over the incision.  These strips should be left on the skin for 7-10 days.   DERMABOND/SKIN GLUE - you may shower in 24 hours.  The glue will flake off over the next 2-3 weeks. Any sutures or staples will be removed at the office during your follow-up visit.  ACTIVITIES You may resume regular (light) daily activities beginning the next day--such as daily self-care, walking, climbing stairs--gradually increasing activities as tolerated.  You may have sexual intercourse when it is comfortable.  Refrain from any heavy lifting or straining until approved by your doctor. You may drive when you are no longer taking prescription pain medication, you can comfortably wear a seatbelt, and you can safely maneuver your car and apply brakes.  FOLLOW-UP You should see your doctor in the office for a follow-up appointment approximately 2-3 weeks after your surgery.  You should have been given your post-op/follow-up appointment when your surgery was scheduled.  If you did not receive a post-op/follow-up appointment, make sure that you call for this appointment within a day or two after you arrive home to insure a convenient appointment time.  OTHER  INSTRUCTIONS YOU WILL HAVE SOME PAIN/DISCOMFORT WITH MOVING/SNEEZING/DISCOMFORT. IT WILL GET BETTER WITH TIME. WEAR ABDOMINAL BINDER  WHEN TO CALL YOUR DOCTOR: Fever over 101.0 Inability to urinate Continued bleeding from incision. Increased pain,  redness, or drainage from the incision. Increasing abdominal pain  The clinic staff is available to answer your questions during regular business hours.  Please don't hesitate to call and ask to speak to one of the nurses for clinical concerns.  If you have a medical emergency, go to the nearest emergency room or call 911.  A surgeon from [facility] is always on call at the hospital. [address], [place], [place], [place]  [address] ? [address], [place], [place]   [address] [phone] ? [phone] ? FAX [phone] Web site: [email or web address]

## 2021-05-30 NOTE — Discharge Summary (Signed)
Physician Discharge Summary  Julia Rose [id number] DOB: Oct 17, 1945 DOA: 05/29/2021  PCP: Kaleen Mask, MD  Admit date: 05/29/2021 Discharge date: 05/30/2021  Recommendations for Outpatient Follow-up:     Follow-up Information     [name], MD Follow up.   Specialty: General Surgery Contact information: [address] [place] [address] [phone]         Kaleen Mask, MD Follow up.   Specialty: Family Medicine Why: for coumadin/inr level check Contact information: [address] [place] [address] [phone]                Discharge Diagnoses:  Incisional incarcerated hernia, without obstruction or gangrene  Aortic stenosis, moderate  Non-insulin treated type 2 diabetes mellitus (CMS-HCC)  Essential hypertension  PAF (paroxysmal atrial fibrillation) (CMS-HCC)  Anticoagulated on Coumadin Surgical Procedure: Laparoscopic repair of incarcerated ventral incisional hernia with mesh  Discharge Condition: good Disposition: home  Diet recommendation: cardiac  There were no vitals filed for this visit.  [facility]   Hospital Course:  The patient was scheduled for elective repair of her symptomatic chronically incarcerated fat-containing incisional ventral hernia.  Please see operative note for additional details.  She had received cardiac clearance.  She did not require Lovenox bridge for her Coumadin.  She received perioperative prophylactic dosing of Lovenox.  She was restarted on Coumadin postoperative.  Pharmacy consulted.  On postop day 1 she was tolerating a diet without nausea or vomiting she was ambulating.  She had the typical expected abdominal wall discomfort.  She was having flatus.  Her pain was controlled.  Her vital signs were stable she had a small drop in her hemoglobin but her vital signs were stable.  I felt she was stable for discharge.  Patient will be discharged on her home Coumadin regimen.  She  was instructed to follow-up with her PCP on Monday for an INR level.  I discussed discharge instructions with the patient  BP (!) 148/62 (BP Location: Left Arm)   Pulse (!) 58   Temp 98.5 F (36.9 C) (Oral)   Resp 18   Ht 5' (1.524 m)   SpO2 96%   BMI 34.57 kg/m   Gen: alert, NAD, non-toxic appearing Pupils: equal, no scleral icterus Pulm: Lungs clear to auscultation, symmetric chest rise CV: regular rate and rhythm Abd: soft, mild approp tender, nondistended. Scant bruise/skin irriation at lower puncture site. No cellulitis. No incisional hernia Ext: no edema, no calf tenderness Skin: no rash, no jaundice  Discharge Instructions  Discharge Instructions     Call MD for:   Complete by: As directed    Temperature >101   Call MD for:  hives   Complete by: As directed    Call MD for:  persistant dizziness or light-headedness   Complete by: As directed    Call MD for:  persistant nausea and vomiting   Complete by: As directed    Call MD for:  redness, tenderness, or signs of infection (pain, swelling, redness, odor or green/yellow discharge around incision site)   Complete by: As directed    Call MD for:  severe uncontrolled pain   Complete by: As directed    Diet - low sodium heart healthy   Complete by: As directed    Discharge instructions   Complete by: As directed    See [facility] discharge instructions   Increase activity slowly   Complete by: As directed       Allergies  as of 05/30/2021       Reactions   Dilantin [phenytoin] Hives, Other (See Comments)   Whelps all over for two weeks   Statins Other (See Comments)   High dose statin - dizziness        Medication List     STOP taking these medications    acetaminophen 650 MG CR tablet Commonly known as: TYLENOL Replaced by: acetaminophen 500 MG tablet       TAKE these medications    acetaminophen 500 MG tablet Commonly known as: TYLENOL Take 2 tablets (1,000 mg total) by mouth every 8 (eight) hours  for 5 days. Replaces: acetaminophen 650 MG CR tablet   albuterol 108 (90 Base) MCG/ACT inhaler Commonly known as: VENTOLIN HFA Inhale 2 puffs into the lungs every 6 (six) hours as needed for wheezing or shortness of breath.   atorvastatin 20 MG tablet Commonly known as: LIPITOR TAKE 1 TABLET BY MOUTH DAILY   citalopram 20 MG tablet Commonly known as: CELEXA Take 10 mg by mouth daily.   ibuprofen 200 MG tablet Commonly known as: ADVIL Take 400 mg by mouth every 6 (six) hours as needed for moderate pain.   JUICE PLUS FIBRE PO Take 1 capsule by mouth in the morning and at bedtime. Fruit What changed: Another medication with the same name was removed. Continue taking this medication, and follow the directions you see here.   losartan 100 MG tablet Commonly known as: COZAAR Take 50 mg by mouth at bedtime.   Magnesium 250 MG Tabs Take 250 mg by mouth daily.   metFORMIN 1000 MG tablet Commonly known as: GLUCOPHAGE Take 500 mg by mouth 2 (two) times daily with a meal.   PHENobarbital 97.2 MG tablet Commonly known as: LUMINAL Take 97.2 mg by mouth at bedtime.   polyvinyl alcohol 1.4 % ophthalmic solution Commonly known as: LIQUIFILM TEARS Place 1 drop into both eyes as needed for dry eyes.   Simethicone 180 MG Caps Take 180 mg by mouth as needed (for gas).   sotalol 120 MG tablet Commonly known as: BETAPACE TAKE 1 TABLET BY MOUTH EVERY 12 HOURS   traMADol 50 MG tablet Commonly known as: Ultram Take 1 tablet (50 mg total) by mouth every 6 (six) hours as needed for up to 7 days.   warfarin 5 MG tablet Commonly known as: COUMADIN Take 1 tablet (5 mg total) by mouth daily. What changed:  how much to take when to take this additional instructions        Follow-up Information     [name], MD Follow up.   Specialty: General Surgery Contact information: [address] [place] [address] [phone]         Kaleen Mask, MD Follow  up.   Specialty: Family Medicine Why: for coumadin/inr level check Contact information: [address] [place] [address] [phone]                  The results of significant diagnostics from this hospitalization (including imaging, microbiology, ancillary and laboratory) are listed below for reference.    Significant Diagnostic Studies: No results found.  Microbiology: Recent Results (from the past 240 hour(s))  SARS Coronavirus 2 (TAT 6-24 hrs)     Status: None   Collection Time: 05/27/21 12:00 AM  Result Value Ref Range Status   SARS Coronavirus 2 RESULT: NEGATIVE  Final    Comment: RESULT: NEGATIVESARS-CoV-2 INTERPRETATION:A NEGATIVE  test result means that  SARS-CoV-2 RNA was not present in the specimen above the limit of detection of this test. This does not preclude a possible SARS-CoV-2 infection and should not be used as the  sole basis for patient management decisions. Negative results must be combined with clinical observations, patient history, and epidemiological information. Optimum specimen types and timing for peak viral levels during infections caused by SARS-CoV-2  have not been determined. Collection of multiple specimens or types of specimens may be necessary to detect virus. Improper specimen collection and handling, sequence variability under primers/probes, or organism present below the limit of detection may  lead to false negative results. Positive and negative predictive values of testing are highly dependent on prevalence. False negative test results are more likely when prevalence of disease is high.The expected result is NEGATIVE.Fact S heet for  Healthcare Providers: [employer]gl Sheet for Patients: https://poole-freeman.org/ Reference Range - Negative      Labs: Basic Metabolic Panel: Recent Labs  Lab 05/30/21 0411  NA 137  K 4.4  CL 102  CO2 28  GLUCOSE 131*  BUN 22   CREATININE 0.90  CALCIUM 9.0   Liver Function Tests: No results for input(s): AST, ALT, ALKPHOS, BILITOT, PROT, ALBUMIN in the last 168 hours. No results for input(s): LIPASE, AMYLASE in the last 168 hours. No results for input(s): AMMONIA in the last 168 hours. CBC: Recent Labs  Lab 05/30/21 0411  WBC 12.3*  HGB 11.7*  HCT 35.5*  MCV 94.9  PLT 177   Cardiac Enzymes: No results for input(s): CKTOTAL, CKMB, CKMBINDEX, TROPONINI in the last 168 hours. BNP: BNP (last 3 results) No results for input(s): BNP in the last 8760 hours.  ProBNP (last 3 results) No results for input(s): PROBNP in the last 8760 hours.  CBG: Recent Labs  Lab 05/29/21 0557 05/29/21 0939 05/29/21 1138 05/29/21 1632  GLUCAP 123* 164* 143* 141*    Active Problems:   S/P laparoscopic hernia repair   Time coordinating discharge: 20 min  Signed:  Atilano Ina, MD [facility], [place] [phone] 05/30/2021, 6:12 PM

## 2021-05-30 NOTE — Progress Notes (Signed)
ANTICOAGULATION CONSULT NOTE  Pharmacy Consult for Warfarin Indication: atrial fibrillation  Allergies  Allergen Reactions   Dilantin [Phenytoin] Hives and Other (See Comments)    Whelps all over for two weeks   Statins Other (See Comments)    High dose statin - dizziness    Patient Measurements: Height: 5' (152.4 cm) IBW/kg (Calculated) : 45.5  Vital Signs: Temp: 98.2 F (36.8 C) (11/04 0555) Temp Source: Oral (11/04 0555) BP: 111/54 (11/04 0555) Pulse Rate: 58 (11/04 0555)  Labs: Recent Labs    05/29/21 0515 05/30/21 0411  HGB  --  11.7*  HCT  --  35.5*  PLT  --  177  APTT 28  --   LABPROT 13.1 14.1  INR 1.0 1.1  CREATININE  --  0.90     CrCl cannot be calculated (Unknown ideal weight.).  Medications:  PTA warfarin dosing: 15 mg x 1 followed by 10 mg x 3 then repeat cycle (No historical INR available in [facility] to confirm adequate dosing plan.)  Assessment: 75 y/o F on warfarin for atrial fibrillation held for hernia repair to resume per pharmacy postop. No significant drug interactions except phenobarbital (PTA med) which will be resumed postop as well.   Today, 05/30/2021: INR 1.1 CBC: Hgb decreased to 11.7 (baseline 13.9 05/14/21), and Plt WNL. No bleeding or complications reported Diet:  regular Drug-drug interactions: phenobarbital (PTA med) resumed postop.  Ketorolac increases risk for bleeding/.  Remains on prophylactic Enoxaparin 40mg  [place] Q24h.   Goal of Therapy:  INR 2-3   Plan:  Warfarin 15 mg x 1 dose at 16:00 Daily INR Recommend continuing PTA dosing upon discharge, recheck INR no later than Monday 11/7  13/7 PharmD, BCPS Clinical Pharmacist [facility] main pharmacy [phone] 05/30/2021 8:18 AM

## 2021-06-06 ENCOUNTER — Emergency Department (HOSPITAL_BASED_OUTPATIENT_CLINIC_OR_DEPARTMENT_OTHER): Payer: Medicare Other

## 2021-06-06 ENCOUNTER — Emergency Department (HOSPITAL_COMMUNITY)
Admission: EM | Admit: 2021-06-06 | Discharge: 2021-06-06 | Disposition: A | Discharge status: Home / Self Care | Payer: Medicare Other | Attending: Emergency Medicine | Admitting: Emergency Medicine

## 2021-06-06 ENCOUNTER — Other Ambulatory Visit: Payer: Self-pay

## 2021-06-06 DIAGNOSIS — R2243 Localized swelling, mass and lump, lower limb, bilateral: Secondary | ICD-10-CM | POA: Insufficient documentation

## 2021-06-06 DIAGNOSIS — Z79899 Other long term (current) drug therapy: Secondary | ICD-10-CM | POA: Insufficient documentation

## 2021-06-06 DIAGNOSIS — M7989 Other specified soft tissue disorders: Secondary | ICD-10-CM | POA: Diagnosis present

## 2021-06-06 DIAGNOSIS — I10 Essential (primary) hypertension: Secondary | ICD-10-CM | POA: Insufficient documentation

## 2021-06-06 DIAGNOSIS — J45909 Unspecified asthma, uncomplicated: Secondary | ICD-10-CM | POA: Diagnosis not present

## 2021-06-06 DIAGNOSIS — Z7901 Long term (current) use of anticoagulants: Secondary | ICD-10-CM | POA: Insufficient documentation

## 2021-06-06 DIAGNOSIS — E119 Type 2 diabetes mellitus without complications: Secondary | ICD-10-CM | POA: Diagnosis not present

## 2021-06-06 DIAGNOSIS — R600 Localized edema: Secondary | ICD-10-CM

## 2021-06-06 DIAGNOSIS — Z7984 Long term (current) use of oral hypoglycemic drugs: Secondary | ICD-10-CM | POA: Insufficient documentation

## 2021-06-06 DIAGNOSIS — R609 Edema, unspecified: Secondary | ICD-10-CM | POA: Diagnosis not present

## 2021-06-06 NOTE — ED Provider Notes (Signed)
[facility] DEPT Provider Note   CSN: [id number] Arrival date & time: 06/06/21  1626     History Chief Complaint  Patient presents with   Leg Swelling   Abnormal labs     Julia Rose is a 75 y.o. female.  Patient c/o bilateral leg swelling since her recent hernia surgery. Symptoms acute onset, mild, persistent, constant, bilateral/symmetric.  Went to pcp today who wanted patient to come to ED to get vascular u/s r/o dvt. Patient denies chest pain or sob. No orthopnea or pnd. No hx dvt or pe. Hx afib - coumadin was held for recent surgery but now pt back on it. Pt notes surgery went well, no complications - is eating/drinking, having bms.   The history is provided by the patient and medical records.      Past Medical History:  Diagnosis Date   Arthritis    "hands" (04/06/2017)   Asthma    mild with exertion   Atrial fibrillation with RVR (HCC)    Chronic back pain    "from the DDD"   DDD (degenerative disc disease), cervical    C-5-6 and C6-7 are partially herniated   DDD (degenerative disc disease), lumbar    L4-5 and L5-6  and S-1 are herniated discs   DDD (degenerative disc disease), thoracic    "not dx'd yet" (04/06/2017)   Hyperlipidemia    statin intolerant   Hypertension    Moderate aortic stenosis    Right bundle branch block    Seizures ([place])    "when I was younger; none since the 80s" (04/06/2017)   Type II diabetes mellitus ([place])     Patient Active Problem List   Diagnosis Date Noted   S/P laparoscopic hernia repair 05/29/2021   Chronic anticoagulation 10/05/2018   DJD (degenerative joint disease), lumbar 10/05/2018   Aortic stenosis, moderate 08/28/2015   PAF (paroxysmal atrial fibrillation) ([place]) 07/09/2015   Right bundle branch block 07/09/2015   Essential hypertension 07/09/2015   Dyslipidemia, goal LDL below 70 07/09/2015   Non-insulin treated type 2 diabetes mellitus ([place]) 07/09/2015   Chest pain 07/09/2015     Past Surgical History:  Procedure Laterality Date   CARDIOVERSION N/A 12/17/2016   Procedure: CARDIOVERSION;  Surgeon: Acie Fredrickson Wonda Cheng, MD;  Location: [facility] ENDOSCOPY;  Service: Cardiovascular;  Laterality: N/A;   [place]   "paraovarian cyst removed"   INCISIONAL HERNIA REPAIR N/A 05/29/2021   Procedure: LAPAROSCOPIC INCISIONAL HERNIA REPAIR WITH MESH;  Surgeon: Greer Pickerel, MD;  Location: [facility] ORS;  Service: General;  Laterality: N/A;   [place]   "partial"     OB History   No obstetric history on file.     Family History  Problem Relation Age of Onset   Kidney disease Mother    Diabetes Mother    Dementia Mother    Hypertension Mother     Social History   Tobacco Use   Smoking status: Never   Smokeless tobacco: Never  Vaping Use   Vaping Use: Never used  Substance Use Topics   Alcohol use: No   Drug use: No    Home Medications Prior to Admission medications   Medication Sig Start Date End Date Taking? Authorizing Provider  albuterol (PROVENTIL HFA;VENTOLIN HFA) 108 (90 Base) MCG/ACT inhaler Inhale 2 puffs into the lungs every 6 (six) hours as needed for wheezing or shortness of breath.    [provider]  atorvastatin (LIPITOR) 20 MG tablet TAKE 1 TABLET BY MOUTH DAILY 05/12/21   Lorretta Harp, MD  citalopram (CELEXA) 20 MG tablet Take 10 mg by mouth daily. 04/11/20   [provider]  ibuprofen (ADVIL) 200 MG tablet Take 400 mg by mouth every 6 (six) hours as needed for moderate pain.    [provider]  losartan (COZAAR) 100 MG tablet Take 50 mg by mouth at bedtime. 04/26/15   [provider]  Magnesium 250 MG TABS Take 250 mg by mouth daily.    [provider]  metFORMIN (GLUCOPHAGE) 1000 MG tablet Take 500 mg by mouth 2 (two) times daily with a meal.    [provider]  Nutritional Supplements (JUICE PLUS FIBRE PO) Take 1 capsule by mouth in the  morning and at bedtime. Fruit    [provider]  PHENobarbital (LUMINAL) 97.2 MG tablet Take 97.2 mg by mouth at bedtime.  06/13/15   [provider]  polyvinyl alcohol (LIQUIFILM TEARS) 1.4 % ophthalmic solution Place 1 drop into both eyes as needed for dry eyes.    [provider]  Simethicone 180 MG CAPS Take 180 mg by mouth as needed (for gas).     [provider]  sotalol (BETAPACE) 120 MG tablet TAKE 1 TABLET BY MOUTH EVERY 12 HOURS 07/05/20   Lorretta Harp, MD  traMADol (ULTRAM) 50 MG tablet Take 1 tablet (50 mg total) by mouth every 6 (six) hours as needed for up to 7 days. 05/30/21 06/06/21  Greer Pickerel, MD  warfarin (COUMADIN) 5 MG tablet Take 1 tablet (5 mg total) by mouth daily. Patient taking differently: Take 7.5-10 mg by mouth See admin instructions. Take 15 mg by mouth daily for one day then take 10 mg by mouth daily for 3 days, then start cycle over 08/28/15   Lorretta Harp, MD    Allergies    Dilantin [phenytoin] and Statins  Review of Systems   Review of Systems  Constitutional:  Negative for chills and fever.  HENT:  Negative for sore throat.   Eyes:  Negative for redness.  Respiratory:  Negative for shortness of breath.   Cardiovascular:  Negative for chest pain.  Gastrointestinal:  Negative for vomiting.  Genitourinary:  Negative for flank pain.  Musculoskeletal:  Negative for back pain.  Skin:  Negative for rash.  Neurological:  Negative for headaches.  Hematological:        No abnormal bruising or bleeding.   Psychiatric/Behavioral:  Negative for confusion.    Physical Exam Updated Vital Signs BP (!) 165/91 (BP Location: Left Arm)   Pulse (!) 103   Temp 98.9 F (37.2 C)   Resp 20   SpO2 96%   Physical Exam Vitals and nursing note reviewed.  Constitutional:      Appearance: Normal appearance. She is well-developed.  HENT:     Head: Atraumatic.     Nose: Nose normal.     Mouth/Throat:     Mouth: Mucous  membranes are moist.  Eyes:     General: No scleral icterus.    Conjunctiva/sclera: Conjunctivae normal.  Neck:     Trachea: No tracheal deviation.  Cardiovascular:     Rate and Rhythm: Normal rate and regular rhythm.     Pulses: Normal pulses.     Heart sounds: Normal heart sounds. No murmur heard.   No friction rub. No gallop.  Pulmonary:     Effort: Pulmonary effort is normal. No respiratory  distress.     Breath sounds: Normal breath sounds.  Abdominal:     General: There is no distension.     Palpations: Abdomen is soft.  Genitourinary:    Comments: No cva tenderness.  Musculoskeletal:     Cervical back: Normal range of motion and neck supple. No rigidity. No muscular tenderness.     Comments: Mild swelling bilateral feet, ankle, lower legs.   Skin:    General: Skin is warm and dry.     Findings: No rash.  Neurological:     Mental Status: She is alert.     Comments: Alert, speech normal.   Psychiatric:        Mood and Affect: Mood normal.    ED Results / Procedures / Treatments   Labs (all labs ordered are listed, but only abnormal results are displayed) Labs Reviewed - No data to display  EKG None  Radiology VAS [place] LOWER EXTREMITY VENOUS (DVT) (7a-7p)  Result Date: 06/06/2021  Lower Venous DVT Study Patient Name:  Julia Rose  Date of Exam:   06/06/2021 Medical Rec #: [id number]       Accession #:    [id number] Date of Birth: [birth year]       Patient Gender: F Patient Age:   75 years Exam Location:  [facility] Procedure:      VAS [place] LOWER EXTREMITY VENOUS (DVT) Referring Phys: Cathren Laine --------------------------------------------------------------------------------  Indications: Edema.  Risk Factors: Surgery (hernia repair) on 05/29/2021. Anticoagulation: Patient takes Coumadin for Afib. Comparison Study: No previous exams Performing Technologist: Jody Hill RVT, RDMS  Examination Guidelines: A complete evaluation includes B-mode imaging, spectral  Doppler, color Doppler, and power Doppler as needed of all accessible portions of each vessel. Bilateral testing is considered an integral part of a complete examination. Limited examinations for reoccurring indications may be performed as noted. The reflux portion of the exam is performed with the patient in reverse Trendelenburg.  +---------+---------------+---------+-----------+----------+--------------+ RIGHT    CompressibilityPhasicitySpontaneityPropertiesThrombus Aging +---------+---------------+---------+-----------+----------+--------------+ CFV      Full           Yes      Yes                                 +---------+---------------+---------+-----------+----------+--------------+ SFJ      Full                                                        +---------+---------------+---------+-----------+----------+--------------+ FV Prox  Full           Yes      Yes                                 +---------+---------------+---------+-----------+----------+--------------+ FV Mid   Full           Yes      Yes                                 +---------+---------------+---------+-----------+----------+--------------+ FV DistalFull           Yes      Yes                                 +---------+---------------+---------+-----------+----------+--------------+  PFV      Full                                                        +---------+---------------+---------+-----------+----------+--------------+ POP      Full           Yes      Yes                                 +---------+---------------+---------+-----------+----------+--------------+ PTV      Full                                                        +---------+---------------+---------+-----------+----------+--------------+ PERO     Full                                                        +---------+---------------+---------+-----------+----------+--------------+    +---------+---------------+---------+-----------+----------+--------------+ LEFT     CompressibilityPhasicitySpontaneityPropertiesThrombus Aging +---------+---------------+---------+-----------+----------+--------------+ CFV      Full           Yes      Yes                                 +---------+---------------+---------+-----------+----------+--------------+ SFJ      Full                                                        +---------+---------------+---------+-----------+----------+--------------+ FV Prox  Full           Yes      Yes                                 +---------+---------------+---------+-----------+----------+--------------+ FV Mid   Full           Yes      Yes                                 +---------+---------------+---------+-----------+----------+--------------+ FV DistalFull           Yes      Yes                                 +---------+---------------+---------+-----------+----------+--------------+ PFV      Full                                                        +---------+---------------+---------+-----------+----------+--------------+  POP      Full           Yes      Yes                                 +---------+---------------+---------+-----------+----------+--------------+ PTV      Full                                                        +---------+---------------+---------+-----------+----------+--------------+ PERO     Full                                                        +---------+---------------+---------+-----------+----------+--------------+    Summary: BILATERAL: - No evidence of deep vein thrombosis seen in the lower extremities, bilaterally. - No evidence of superficial venous thrombosis in the lower extremities, bilaterally. - RIGHT: - No cystic structure found in the popliteal fossa.  LEFT: - A cystic structure is found in the popliteal fossa.  *See table(s) above for measurements  and observations.    Preliminary     Procedures Procedures   Medications Ordered in ED Medications - No data to display  ED Course  I have reviewed the triage vital signs and the nursing notes.  Pertinent labs & imaging results that were available during my care of the patient were reviewed by me and considered in my medical decision making (see chart for details).    MDM Rules/Calculators/A&P                          Vascular u/s ordered.   Reviewed nursing notes and prior charts for additional history.   Vascular u/s reviewed/interpreted by me - no dvt. Possible baker cyst on left.   Recheck pt, no other c/o. States feels breathing well, at baseline. Discussed results.   Pt currently appears stable for d/c.      Final Clinical Impression(s) / ED Diagnoses Final diagnoses:  None    Rx / DC Orders ED Discharge Orders     None        Lajean Saver, MD 06/06/21 2019

## 2021-06-06 NOTE — Progress Notes (Signed)
BLE venous duplex has been completed.  Preliminary results given to Abby, RN.  Results can be found under chart review under CV PROC. 06/06/2021 6:44 PM Tyliah Schlereth RVT, RDMS

## 2021-06-06 NOTE — ED Triage Notes (Signed)
Pt arrives POV after being sent from her PCO d/t concern for blood clots in her legs. Pt states she has had leg swelling since 11/3 (surgery day) and states her labs were abnormal at her PCP so they sent her here.

## 2021-06-06 NOTE — Discharge Instructions (Addendum)
It was our pleasure to provide your ER care today - we hope that you feel better.  Your vascular study was read as showing no blood clots. Incidental note was made of small cyst/Bakers cyst on left.   Elevate legs to help with swelling, and minimize extra salt intake.   Follow up with your primary care doctor closely in the coming week - including for follow up of recent labs, and to make sure your coumadin level is good.   Return to ER if worse, new symptoms, high fevers, chest pain, trouble breathing, new/worsening abdominal pain, or other concern.

## 2021-07-16 ENCOUNTER — Other Ambulatory Visit: Payer: Self-pay | Admitting: Cardiovascular Disease

## 2021-08-11 ENCOUNTER — Other Ambulatory Visit: Payer: Self-pay | Admitting: Cardiovascular Disease

## 2021-08-14 ENCOUNTER — Other Ambulatory Visit: Payer: Self-pay | Admitting: Family Medicine

## 2021-08-14 DIAGNOSIS — Z1231 Encounter for screening mammogram for malignant neoplasm of breast: Secondary | ICD-10-CM

## 2021-08-29 ENCOUNTER — Telehealth: Payer: Self-pay | Admitting: Cardiovascular Disease

## 2021-08-29 DIAGNOSIS — I350 Nonrheumatic aortic (valve) stenosis: Secondary | ICD-10-CM

## 2021-08-29 NOTE — Telephone Encounter (Signed)
-  Pt state she was supposed to have a repeat ECHO this year but haven't heard anything. -Per chart review, Dr. Allyson Sabal indicated on 09/20/20 pt need to repeat ECHO in 1 year. -Nurse placed order and message sent to scheduling  -Pt verbalized understanding.    Runell Gess, MD  09/20/2020  6:40 AM EST     Nl LV systolic FXN with mild-moderate AS. Repeat 12 months

## 2021-08-29 NOTE — Telephone Encounter (Signed)
Patient called in to say she is supposed to have echo done but there is not ordered in. Please advise

## 2021-09-09 ENCOUNTER — Ambulatory Visit
Admission: RE | Admit: 2021-09-09 | Discharge: 2021-09-09 | Disposition: A | Discharge status: Home / Self Care | Payer: Medicare Other | Source: Ambulatory Visit | Attending: Family Medicine | Admitting: Family Medicine

## 2021-09-09 ENCOUNTER — Ambulatory Visit (HOSPITAL_COMMUNITY): Payer: Medicare Other | Attending: Cardiology

## 2021-09-09 ENCOUNTER — Other Ambulatory Visit: Payer: Self-pay

## 2021-09-09 DIAGNOSIS — I350 Nonrheumatic aortic (valve) stenosis: Secondary | ICD-10-CM | POA: Diagnosis present

## 2021-09-09 DIAGNOSIS — Z1231 Encounter for screening mammogram for malignant neoplasm of breast: Secondary | ICD-10-CM

## 2021-09-09 LAB — ECHOCARDIOGRAM COMPLETE
AR max vel: 0.73 cm2
AV Area VTI: 0.75 cm2
AV Area mean vel: 0.65 cm2
AV Mean grad: 13.4 mmHg
AV Peak grad: 21.4 mmHg
Ao pk vel: 2.31 m/s
S' Lateral: 2.3 cm

## 2021-09-15 ENCOUNTER — Other Ambulatory Visit: Payer: Self-pay | Admitting: Cardiovascular Disease

## 2021-10-22 ENCOUNTER — Ambulatory Visit: Payer: Medicare Other | Admitting: Cardiovascular Disease

## 2021-10-22 ENCOUNTER — Encounter: Payer: Self-pay | Admitting: Cardiovascular Disease

## 2021-10-22 ENCOUNTER — Other Ambulatory Visit: Payer: Self-pay

## 2021-10-22 DIAGNOSIS — I480 Paroxysmal atrial fibrillation: Secondary | ICD-10-CM | POA: Diagnosis not present

## 2021-10-22 DIAGNOSIS — I10 Essential (primary) hypertension: Secondary | ICD-10-CM | POA: Diagnosis not present

## 2021-10-22 DIAGNOSIS — E785 Hyperlipidemia, unspecified: Secondary | ICD-10-CM | POA: Diagnosis not present

## 2021-10-22 DIAGNOSIS — I4510 Unspecified right bundle-branch block: Secondary | ICD-10-CM | POA: Diagnosis not present

## 2021-10-22 DIAGNOSIS — I350 Nonrheumatic aortic (valve) stenosis: Secondary | ICD-10-CM

## 2021-10-22 MED ORDER — AMLODIPINE BESYLATE 2.5 MG PO TABS: 2.5000 mg | ORAL_TABLET | Freq: Every day | ORAL | 3 refills | Status: DC

## 2021-10-22 NOTE — Assessment & Plan Note (Signed)
History of moderate aortic stenosis with 2D echo performed 09/09/2021 revealing normal LV systolic function with moderate LVH and mild to moderate aortic stenosis with a valve area of 0.73 cm? and peak gradient of 21 mmHg.  Patient is asymptomatic.  We will continue to follow this on annual basis. ?

## 2021-10-22 NOTE — Assessment & Plan Note (Signed)
History of dyslipidemia on statin therapy with lipid profile performed 06/19/2020 revealing a total cholesterol 150, LDL 73 and HDL 38.  We will recheck a lipid liver profile this morning. ?

## 2021-10-22 NOTE — Progress Notes (Signed)
? ? ? ?10/22/2021 ?Julia Rose   ?1946/06/22  ?[id number] ? ?Primary Physician Kaleen Mask, MD ?Primary Cardiologist: Runell Gess MD Nicholes Calamity, [place] ? ?HPI:  Julia Rose is a 76 y.o.  moderately overweight married Caucasian female mother of 4 children, grandmother of a grandchildren whose husband [name] is also a patient of mine. She was referred by Dr. Jeannetta Nap for evaluation of recently recognized atrial fibrillation. I last saw her in the office 04/19/2020 her cardiac risk factor profile is notable for treated hypertension, diabetes. She has hyperlipidemia intolerant to statin drugs. She's never had a heart stroke. She occasionally gets chest tightness. 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 She was recently found to be in A. Fib during a routine doctor's visit and was referred here for further evaluation. 8. Event monitor showed A. Fib with heart rates as high as 170. 2-D echo revealed normal LV systolic function with mild to moderate aortic stenosis and a Myoview stress test was low risk and nonischemic. Carotid Doppler showed no evidence of ICA stenosis. She was placed on oral anticoagulation. Today she is in atrial fibrillation with a controlled ventricular response on Coumadin anticoagulation. She underwent inpatient sotalol loading in the fall of last year and saw Rudi Coco and the A. fib clinic last month at which time she was in sinus bradycardia. She is back in A. fib. I was unaware of this. Recent 2-D echocardiogram performed 08/31/17 revealed progression of her aortic stenosis with a valve area of 1.08 cm? and a peak gradient of 36 mmHg. She did tell me that recently one of her brothers died of ischemic heart disease post valve replacement and bypass grafting and another brother had bypass grafting as well. ?  ?Since I saw her a year and a half ago she has remained stable.  She currently is in sinus rhythm on sotalol and warfarin which Dr. Jeannetta Nap  follows.  Her most recent 2D echo performed 09/09/2021 revealed normal LV systolic function with at least moderate if not more aortic stenosis with a valve area of 0.73 cm.  The peak gradient of 21 mmHg. ? ? ?Current Meds  ?Medication Sig  ? albuterol (PROVENTIL HFA;VENTOLIN HFA) 108 (90 Base) MCG/ACT inhaler Inhale 2 puffs into the lungs every 6 (six) hours as needed for wheezing or shortness of breath.  ? atorvastatin (LIPITOR) 20 MG tablet TAKE 1 TABLET BY MOUTH DAILY  ? citalopram (CELEXA) 20 MG tablet Take 10 mg by mouth daily.  ? ibuprofen (ADVIL) 200 MG tablet Take 400 mg by mouth every 6 (six) hours as needed for moderate pain.  ? losartan (COZAAR) 100 MG tablet Take 50 mg by mouth at bedtime.  ? Magnesium 250 MG TABS Take 250 mg by mouth daily.  ? metFORMIN (GLUCOPHAGE) 1000 MG tablet Take 500 mg by mouth 2 (two) times daily with a meal.  ? Nutritional Supplements (JUICE PLUS FIBRE PO) Take 1 capsule by mouth in the morning and at bedtime. Fruit  ? PHENobarbital (LUMINAL) 97.2 MG tablet Take 97.2 mg by mouth at bedtime.   ? polyvinyl alcohol (LIQUIFILM TEARS) 1.4 % ophthalmic solution Place 1 drop into both eyes as needed for dry eyes.  ? Simethicone 180 MG CAPS Take 180 mg by mouth as needed (for gas).   ? sotalol (BETAPACE) 120 MG tablet Take 1 tablet (120 mg total) by mouth every 12 (twelve) hours. Keep scheduled appointment for further refills  ? warfarin (COUMADIN) 5 MG  tablet Take 1 tablet (5 mg total) by mouth daily. (Patient taking differently: Take 7.5-10 mg by mouth See admin instructions. Take 15 mg by mouth daily for one day then take 10 mg by mouth daily for 3 days, then start cycle over)  ?  ? ?Allergies  ?Allergen Reactions  ? Dilantin [Phenytoin] Hives and Other (See Comments)  ?  Whelps all over for two weeks  ? Statins Other (See Comments)  ?  High dose statin - dizziness  ? ? ?Social History  ? ?Socioeconomic History  ? Marital status: Married  ?  Spouse name: Not on file  ? Number of  children: Not on file  ? Years of education: Not on file  ? Highest education level: Not on file  ?Occupational History  ? Not on file  ?Tobacco Use  ? Smoking status: Never  ? Smokeless tobacco: Never  ?Vaping Use  ? Vaping Use: Never used  ?Substance and Sexual Activity  ? Alcohol use: No  ? Drug use: No  ? Sexual activity: Not Currently  ?Other Topics Concern  ? Not on file  ?Social History Narrative  ? Not on file  ? ?Social Determinants of Health  ? ?Financial Resource Strain: Not on file  ?Food Insecurity: Not on file  ?Transportation Needs: Not on file  ?Physical Activity: Not on file  ?Stress: Not on file  ?Social Connections: Not on file  ?Intimate Partner Violence: Not on file  ?  ? ?Review of Systems: ?General: negative for chills, fever, night sweats or weight changes.  ?Cardiovascular: negative for chest pain, dyspnea on exertion, edema, orthopnea, palpitations, paroxysmal nocturnal dyspnea or shortness of breath ?Dermatological: negative for rash ?Respiratory: negative for cough or wheezing ?Urologic: negative for hematuria ?Abdominal: negative for nausea, vomiting, diarrhea, bright red blood per rectum, melena, or hematemesis ?Neurologic: negative for visual changes, syncope, or dizziness ?All other systems reviewed and are otherwise negative except as noted above. ? ? ? ?Blood pressure (!) 172/84, pulse (!) 59, height 5' (1.524 m), weight 187 lb 6.4 oz (85 kg), SpO2 96 %.  ?General appearance: alert and no distress ?Neck: no adenopathy, no JVD, supple, symmetrical, trachea midline, thyroid not enlarged, symmetric, no tenderness/mass/nodules, and bilateral carotid bruits versus transmitted murmur ?Lungs: clear to auscultation bilaterally ?Heart: 2/6 outflow tract murmur consistent with aortic stenosis ?Extremities: extremities normal, atraumatic, no cyanosis or edema ?Pulses: 2+ and symmetric ?Skin: Skin color, texture, turgor normal. No rashes or lesions ?Neurologic: Grossly normal ? ?EKG sinus  bradycardia 59 with right bundle branch block.  I personally reviewed this EKG. ? ?ASSESSMENT AND PLAN:  ? ?PAF (paroxysmal atrial fibrillation) (HCC) ?History of PAF maintaining sinus rhythm on sotalol and warfarin anticoagulation followed by Dr. Jeannetta NapElkins. ? ?Right bundle branch block ?Chronic ? ?Essential hypertension ?History of essential hypertension blood pressure measured today at 172/84.  She is on losartan.  She does admit to dietary indiscretion with regards to salt.  I am going to add low-dose amlodipine and will have her keep a 30-day blood pressure log and follow-up with Pharm.D. after that to make further adjustments and/or modifications. ? ?Dyslipidemia, goal LDL below 70 ?History of dyslipidemia on statin therapy with lipid profile performed 06/19/2020 revealing a total cholesterol 150, LDL 73 and HDL 38.  We will recheck a lipid liver profile this morning. ? ?Aortic stenosis, moderate ?History of moderate aortic stenosis with 2D echo performed 09/09/2021 revealing normal LV systolic function with moderate LVH and mild to moderate aortic stenosis with  a valve area of 0.73 cm? and peak gradient of 21 mmHg.  Patient is asymptomatic.  We will continue to follow this on annual basis. ? ? ? ? ?Runell Gess MD FACP,FACC,FAHA, FSCAI ?10/22/2021 ?10:36 AM ?

## 2021-10-22 NOTE — Assessment & Plan Note (Signed)
History of essential hypertension blood pressure measured today at 172/84.  She is on losartan.  She does admit to dietary indiscretion with regards to salt.  I am going to add low-dose amlodipine and will have her keep a 30-day blood pressure log and follow-up with Pharm.D. after that to make further adjustments and/or modifications. ?

## 2021-10-22 NOTE — Patient Instructions (Signed)
Medication Instructions:  ? ?-Start Amlodipine (norvasc) 2.5mg  once daily. ? ?*If you need a refill on your cardiac medications before your next appointment, please call your pharmacy* ? ? ?Lab Work: ?Your physician recommends that you labs drawn today: lipid/liver profile ? ?If you have labs (blood work) drawn today and your tests are completely normal, you will receive your results only by: ?MyChart Message (if you have MyChart) OR ?A paper copy in the mail ?If you have any lab test that is abnormal or we need to change your treatment, we will call you to review the results. ? ? ?Testing/Procedures: ?Your physician has requested that you have an echocardiogram. Echocardiography is a painless test that uses sound waves to create images of your heart. It provides your doctor with information about the size and shape of your heart and how well your heart?s chambers and valves are working. This procedure takes approximately one hour. There are no restrictions for this procedure. To be done in Feb 2024. This procedure is done at [address] ? ? ? ?Follow-Up: ?At [facility], you and your health needs are our priority.  As part of our continuing mission to provide you with exceptional heart care, we have created designated Provider Care Teams.  These Care Teams include your primary Cardiologist (physician) and Advanced Practice Providers (APPs -  Physician Assistants and Nurse Practitioners) who all work together to provide you with the care you need, when you need it. ? ?We recommend signing up for the patient portal called "MyChart".  Sign up information is provided on this After Visit Summary.  MyChart is used to connect with patients for Virtual Visits (Telemedicine).  Patients are able to view lab/test results, encounter notes, upcoming appointments, etc.  Non-urgent messages can be sent to your provider as well.   ?To learn more about what you can do with MyChart, go to [email or web address].    ? ?Your next appointment:   ?12 month(s) ? ?The format for your next appointment:   ?In Person ? ?Provider:   ?Nanetta Batty, MD ? ? ?Other Instructions ?Dr. Allyson Sabal has requested that you schedule an appointment with one of our clinical pharmacists for a blood pressure check appointment within the next 4 weeks.  ?If you monitor your blood pressure (BP) at home, please bring your BP cuff and your BP readings with you to this appointment ? ?HOW TO TAKE YOUR BLOOD PRESSURE: ?Rest 5 minutes before taking your blood pressure. ?Don?t smoke or drink caffeinated beverages for at least 30 minutes before. ?Take your blood pressure before (not after) you eat. ?Sit comfortably with your back supported and both feet on the floor (don?t cross your legs). ?Elevate your arm to heart level on a table or a desk. ?Use the proper sized cuff. It should fit smoothly and snugly around your bare upper arm. There should be enough room to slip a fingertip under the cuff. The bottom edge of the cuff should be 1 inch above the crease of the elbow. ?Ideally, take 3 measurements at one sitting and record the average. ? ? ?

## 2021-10-22 NOTE — Assessment & Plan Note (Signed)
Chronic. 

## 2021-10-22 NOTE — Assessment & Plan Note (Signed)
History of PAF maintaining sinus rhythm on sotalol and warfarin anticoagulation followed by Dr. Jeannetta Nap. ?

## 2021-10-23 LAB — LIPID PANEL
Chol/HDL Ratio: 4 ratio (ref 0.0–4.4)
Cholesterol, Total: 182 mg/dL (ref 100–199)
HDL: 46 mg/dL (ref >39)
LDL Chol Calc (NIH): 101 mg/dL — ABNORMAL HIGH (ref 0–99)
Triglycerides: 204 mg/dL — ABNORMAL HIGH (ref 0–149)
VLDL Cholesterol Cal: 35 mg/dL (ref 5–40)

## 2021-10-23 LAB — HEPATIC FUNCTION PANEL
ALT: 25 IU/L (ref 0–32)
AST: 18 IU/L (ref 0–40)
Albumin: 4.5 g/dL (ref 3.7–4.7)
Alkaline Phosphatase: 83 IU/L (ref 44–121)
Bilirubin Total: 0.4 mg/dL (ref 0.0–1.2)
Bilirubin, Direct: 0.1 mg/dL (ref 0.00–0.40)
Total Protein: 6.8 g/dL (ref 6.0–8.5)

## 2021-11-17 ENCOUNTER — Other Ambulatory Visit: Payer: Self-pay | Admitting: Cardiovascular Disease

## 2021-11-18 ENCOUNTER — Encounter: Payer: Self-pay | Admitting: Pharmacist Clinician (PhC)/ Clinical Pharmacy Specialist

## 2021-11-18 ENCOUNTER — Ambulatory Visit (INDEPENDENT_AMBULATORY_CARE_PROVIDER_SITE_OTHER): Payer: Medicare Other | Admitting: Pharmacist Clinician (PhC)/ Clinical Pharmacy Specialist

## 2021-11-18 DIAGNOSIS — I10 Essential (primary) hypertension: Secondary | ICD-10-CM | POA: Diagnosis not present

## 2021-11-18 NOTE — Patient Instructions (Signed)
Continue to monitor BP at home 2-3 times per week and keep track of the readings.  If you notice a pattern of readings > 130/80 please reach out to the office.   ? ?Check your blood pressure at home daily and keep record of the readings. ? ?Take your BP meds as follows: ? Continue with all current medications ? ?Bring all of your meds, your BP cuff and your record of home blood pressures to your next appointment.  Exercise as you?re able, try to walk approximately 30 minutes per day.  Keep salt intake to a minimum, especially watch canned and prepared boxed foods.  Eat more fresh fruits and vegetables and fewer canned items.  Avoid eating in fast food restaurants.  ? ? HOW TO TAKE YOUR BLOOD PRESSURE: ?Rest 5 minutes before taking your blood pressure. ? Don?t smoke or drink caffeinated beverages for at least 30 minutes before. ?Take your blood pressure before (not after) you eat. ?Sit comfortably with your back supported and both feet on the floor (don?t cross your legs). ?Elevate your arm to heart level on a table or a desk. ?Use the proper sized cuff. It should fit smoothly and snugly around your bare upper arm. There should be enough room to slip a fingertip under the cuff. The bottom edge of the cuff should be 1 inch above the crease of the elbow. ?Ideally, take 3 measurements at one sitting and record the average. ? ? ?

## 2021-11-18 NOTE — Assessment & Plan Note (Signed)
Patient with essential hypertension, currently doing well with combination of losartan and amlodipine.  Blood pressure cuff measured 6/6 points higher than office readings, so based on that, home BP average looks good.   Advised patient to continue with medications and monitor home pressure 2-3 times per week.  She should reach out to the office should she notice readings trending to > 130/80.  Otherwise she can continue with current regimen.   ?

## 2021-11-18 NOTE — Progress Notes (Signed)
? ? ? ?11/18/2021 ?Darnelle Bos ?1946/01/27 ?[id number] ? ? ?HPI:  Julia Rose is a 76 y.o. female patient of Dr Gwenlyn Found, with a [place] below who presents today for hypertension clinic evaluation.  She was seen by Dr. Gwenlyn Found last month, at which time her BP was noted to be 172/84, but dropped to 142/82  on repeat. (HR 59).  Patient admitted to dietary indiscretion in regards to salt.  Dr. Gwenlyn Found added amlodipine 2.5 mg daily, asked that she keep a BP log and return in a month.   ? ?Today she returns for follow up. She notes occasional SOB with exertion, but admits to being rather sedentary overall.  No significant LEE since starting amlodipine.  She reports home BP readings have been mostly WNL.   ? ?Past Medical History: ?AF Found on routine eval at PCP, CHADS2VASc 5 on warfarin (no DOAC, on phenobarbital); on sotalol  ?hyperlipidemia 3/23 LDL 101 - On atorvastatin 20  ?DM2 10/22 A1c 6.4;  on metformin  ?  ? ?Blood Pressure Goal:  130/80 ? ?Current Medications: amlodipine 2.5 mg qd am,  losartan 50 mg qd pm ? ?Family Hx: one brother recently deceased after valve replacement/CABG; another brother with CABG ; father had hypertension, mother as she got older; several half siblings (both sides), 3 children living, 1 son died from allergic reaction to lisinopril;  ? ?Social Hx: no tobacco, only occasional wine (bottle per year); only occasional soda or tea ? ?Diet: prefers to eat at home, but fridge died and waiting for repair/replacement; likes meat and 2 veggies with dinner (vegetables fresh, frozen and canned) some snacking, but not usually (for dinner at this week); avoids potatoes, bread ? ?Exercise: none, arthritis in knees, hips and back (herniated discs in lower back and neck) ? ?Home BP readings: 23 home readings average 128/78, (range 117-172/62-103) ? ?Intolerances: statins - dizziness, phenytoin - hives ? ?Labs: 05/30/21: Na 137, K 4.4,  ? ? ?Wt Readings from Last 3 Encounters:  ?11/18/21 191 lb 6.4 oz (86.8 kg)   ?10/22/21 187 lb 6.4 oz (85 kg)  ?05/14/21 177 lb (80.3 kg)  ? ?BP Readings from Last 3 Encounters:  ?11/18/21 132/84  ?10/22/21 (!) 142/82  ?06/06/21 (!) 154/90  ? ?Pulse Readings from Last 3 Encounters:  ?11/18/21 63  ?10/22/21 (!) 59  ?06/06/21 84  ? ? ?Current Outpatient Medications  ?Medication Sig Dispense Refill  ? albuterol (PROVENTIL HFA;VENTOLIN HFA) 108 (90 Base) MCG/ACT inhaler Inhale 2 puffs into the lungs every 6 (six) hours as needed for wheezing or shortness of breath.    ? amLODipine (NORVASC) 2.5 MG tablet Take 1 tablet (2.5 mg total) by mouth daily. 90 tablet 3  ? atorvastatin (LIPITOR) 20 MG tablet TAKE 1 TABLET BY MOUTH DAILY 90 tablet 3  ? citalopram (CELEXA) 20 MG tablet Take 10 mg by mouth daily.    ? ibuprofen (ADVIL) 200 MG tablet Take 400 mg by mouth every 6 (six) hours as needed for moderate pain.    ? losartan (COZAAR) 100 MG tablet Take 50 mg by mouth at bedtime.  0  ? Magnesium 250 MG TABS Take 250 mg by mouth daily.    ? metFORMIN (GLUCOPHAGE) 1000 MG tablet Take 500 mg by mouth 2 (two) times daily with a meal.    ? Nutritional Supplements (JUICE PLUS FIBRE PO) Take 1 capsule by mouth in the morning and at bedtime. Fruit    ? PHENobarbital (LUMINAL) 97.2 MG tablet Take 97.2 mg  by mouth at bedtime.   4  ? polyvinyl alcohol (LIQUIFILM TEARS) 1.4 % ophthalmic solution Place 1 drop into both eyes as needed for dry eyes.    ? Simethicone 180 MG CAPS Take 180 mg by mouth as needed (for gas).     ? sotalol (BETAPACE) 120 MG tablet TAKE 1 TABLET BY MOUTH EVERY 12 HOURS 120 tablet 3  ? warfarin (COUMADIN) 5 MG tablet Take 1 tablet (5 mg total) by mouth daily. (Patient taking differently: Take 7.5-10 mg by mouth See admin instructions. Take 15 mg by mouth daily for one day then take 10 mg by mouth daily for 3 days, then start cycle over) 30 tablet 0  ? ?No current facility-administered medications for this visit.  ? ? ?Allergies  ?Allergen Reactions  ? Dilantin [Phenytoin] Hives and Other (See  Comments)  ?  Whelps all over for two weeks  ? Statins Other (See Comments)  ?  High dose statin - dizziness  ? ? ?Past Medical History:  ?Diagnosis Date  ? Arthritis   ? "hands" (04/06/2017)  ? Asthma   ? mild with exertion  ? Atrial fibrillation with RVR ([place])   ? Chronic back pain   ? "from the DDD"  ? DDD (degenerative disc disease), cervical   ? C-5-6 and C6-7 are partially herniated  ? DDD (degenerative disc disease), lumbar   ? L4-5 and L5-6  and S-1 are herniated discs  ? DDD (degenerative disc disease), thoracic   ? "not dx'd yet" (04/06/2017)  ? Hyperlipidemia   ? statin intolerant  ? Hypertension   ? Moderate aortic stenosis   ? Right bundle branch block   ? Seizures ([place])   ? "when I was younger; none since the 28s" (04/06/2017)  ? Type II diabetes mellitus ([place])   ? ? ?Blood pressure 132/84, pulse 63, resp. rate 14, height 5' (1.524 m), weight 191 lb 6.4 oz (86.8 kg), SpO2 97 %. ? ?Essential hypertension ?Patient with essential hypertension, currently doing well with combination of losartan and amlodipine.  Blood pressure cuff measured 6/6 points higher than office readings, so based on that, home BP average looks good.   Advised patient to continue with medications and monitor home pressure 2-3 times per week.  She should reach out to the office should she notice readings trending to > 130/80.  Otherwise she can continue with current regimen.   ? ? ?Tommy Medal PharmD [facility] ?[address] ?[place],  [address] ?[phone] ?

## 2021-12-23 ENCOUNTER — Other Ambulatory Visit: Payer: Self-pay

## 2021-12-23 DIAGNOSIS — E785 Hyperlipidemia, unspecified: Secondary | ICD-10-CM

## 2022-07-23 ENCOUNTER — Other Ambulatory Visit: Payer: Self-pay | Admitting: Cardiovascular Disease

## 2022-07-31 ENCOUNTER — Other Ambulatory Visit: Payer: Self-pay | Admitting: Family Medicine

## 2022-07-31 DIAGNOSIS — Z1231 Encounter for screening mammogram for malignant neoplasm of breast: Secondary | ICD-10-CM

## 2022-09-03 ENCOUNTER — Ambulatory Visit (HOSPITAL_COMMUNITY): Payer: Medicare Other | Attending: Cardiovascular Disease

## 2022-09-03 DIAGNOSIS — I10 Essential (primary) hypertension: Secondary | ICD-10-CM | POA: Insufficient documentation

## 2022-09-03 DIAGNOSIS — E785 Hyperlipidemia, unspecified: Secondary | ICD-10-CM | POA: Diagnosis present

## 2022-09-03 DIAGNOSIS — I480 Paroxysmal atrial fibrillation: Secondary | ICD-10-CM | POA: Insufficient documentation

## 2022-09-03 DIAGNOSIS — I4510 Unspecified right bundle-branch block: Secondary | ICD-10-CM | POA: Insufficient documentation

## 2022-09-03 DIAGNOSIS — I350 Nonrheumatic aortic (valve) stenosis: Secondary | ICD-10-CM | POA: Diagnosis present

## 2022-09-03 LAB — ECHOCARDIOGRAM COMPLETE
AR max vel: 0.71 cm2
AV Area VTI: 0.77 cm2
AV Area mean vel: 0.72 cm2
AV Mean grad: 18 mmHg
AV Peak grad: 32.3 mmHg
Ao pk vel: 2.84 m/s
Area-P 1/2: 4.39 cm2
P 1/2 time: 811 msec
S' Lateral: 2.8 cm

## 2022-09-11 ENCOUNTER — Other Ambulatory Visit: Payer: Self-pay

## 2022-09-11 DIAGNOSIS — E785 Hyperlipidemia, unspecified: Secondary | ICD-10-CM

## 2022-09-11 DIAGNOSIS — I350 Nonrheumatic aortic (valve) stenosis: Secondary | ICD-10-CM

## 2022-09-11 DIAGNOSIS — I10 Essential (primary) hypertension: Secondary | ICD-10-CM

## 2022-09-18 ENCOUNTER — Ambulatory Visit
Admission: RE | Admit: 2022-09-18 | Discharge: 2022-09-18 | Disposition: A | Discharge status: Home / Self Care | Payer: Medicare Other | Source: Ambulatory Visit | Attending: Family Medicine | Admitting: Family Medicine

## 2022-09-18 DIAGNOSIS — Z1231 Encounter for screening mammogram for malignant neoplasm of breast: Secondary | ICD-10-CM

## 2022-10-15 ENCOUNTER — Telehealth: Payer: Self-pay | Admitting: Cardiovascular Disease

## 2022-10-15 NOTE — Telephone Encounter (Signed)
Call to patient daughter  and she has patient get on phone to discuss.  Patient states since December she has used apple watch and it states she is in A-Fib 86-80% of the time with heart rate at 80-90 bpm average.  She states that over the last 3 months she has had an episode 1X week that is SOB, heaviness in chest, and can feel heart palpitations and skipping beats.  She states this week she has had 3 episodes. So is concerned.  She states the SOB when lying down for bed, only last a few minutes and then she falls asleep. She sleeps on stomach or side.  She then will wake up in the middle of the night to go to the bathroom and has issues with SOB.  She states it is labored at that time as well as cold hands that then turn to cold body with shivers.  She states going back to bed but can never get warm or get her breathing back to normal.  She does have some SOB with walking, which walks with cane. She states this has been ongoing not new.  BP 151/99 HR 69 (20 minutes after BP medication.  BP 133/83  HR 67 about 45 minutes after meds.  She feels "OK" at this time but is concerned based on her watch, that she should be seen sooner, but prefers to see Dr Gwenlyn Found and not a PA. Please advise

## 2022-10-15 NOTE — Telephone Encounter (Signed)
Patient c/o Palpitations:  High priority if patient c/o lightheadedness, shortness of breath, or chest pain  How long have you had palpitations/irregular HR/ Afib? Are you having the symptoms now? Apple watch is saying that the pt has been in afib 80% of the time since December, Dorian Pod stated that the pt's apple watch stated that from march 11th -15th the pt was in afib 76% of the time.  Are you currently experiencing lightheadedness, SOB or CP? No  Do you have a history of afib (atrial fibrillation) or irregular heart rhythm? Yes  Have you checked your BP or HR? (document readings if available): No  Are you experiencing any other symptoms? Difficulty breathing when the pt goes to lay down and when the pt is moving throughout the day. The SOB has became more frequently than what it was. Pt did say that she had some mild heaviness in her chest but she is not feeling that pain right now at this moment. Dorian Pod did state that she will be with the pt until 12 PM today.

## 2022-10-16 NOTE — Telephone Encounter (Signed)
Left voicemail for patient to return call to office. 

## 2022-10-16 NOTE — Telephone Encounter (Signed)
Patient has appointment scheduled with Dr. Gwenlyn Found in April.

## 2022-10-28 ENCOUNTER — Other Ambulatory Visit: Payer: Self-pay | Admitting: Cardiovascular Disease

## 2022-11-17 ENCOUNTER — Encounter: Payer: Self-pay | Admitting: Cardiovascular Disease

## 2022-11-17 ENCOUNTER — Telehealth: Payer: Self-pay | Admitting: Pharmacist

## 2022-11-17 ENCOUNTER — Ambulatory Visit: Payer: Medicare Other | Attending: Cardiovascular Disease | Admitting: Cardiovascular Disease

## 2022-11-17 VITALS — BP 142/82 | HR 77 | Ht 60.0 in | Wt 196.3 lb

## 2022-11-17 DIAGNOSIS — E785 Hyperlipidemia, unspecified: Secondary | ICD-10-CM

## 2022-11-17 DIAGNOSIS — I480 Paroxysmal atrial fibrillation: Secondary | ICD-10-CM | POA: Diagnosis not present

## 2022-11-17 DIAGNOSIS — I4510 Unspecified right bundle-branch block: Secondary | ICD-10-CM

## 2022-11-17 DIAGNOSIS — I10 Essential (primary) hypertension: Secondary | ICD-10-CM | POA: Diagnosis not present

## 2022-11-17 DIAGNOSIS — I350 Nonrheumatic aortic (valve) stenosis: Secondary | ICD-10-CM

## 2022-11-17 NOTE — Telephone Encounter (Signed)
Discussed with patient possible switching from warfarin to a DOAC. Previously was unaffordable. Printed Eliquis patient assistance paperwork and discussed with patient. Pt will bring back tax returns and pharmacy out of pocket expensives

## 2022-11-17 NOTE — Assessment & Plan Note (Addendum)
History of persistent A-fib rate controlled on Coumadin anticoagulation.  We did talk about transitioning to a DOAC which she is receptive to.  She remains on sotalol.  I think that her fatigue and shortness of breath are somewhat related to her A-fib which she has not more than she is in sinus rhythm.  I am referring her back to the A-fib clinic for further evaluation.

## 2022-11-17 NOTE — Assessment & Plan Note (Signed)
History of moderate aortic stenosis by 2D echo recently performed 09/03/2022.  LV function was normal.  Her aortic valve measured 0.77 cm which is unchanged although her peak gradient has increased to 32 mmHg from 21.  Will continue to follow this on annual basis.

## 2022-11-17 NOTE — Assessment & Plan Note (Signed)
Chronic. 

## 2022-11-17 NOTE — Progress Notes (Signed)
11/17/2022 Julia Rose   02/06/1946  [id number]  Primary Physician Julia Mask, MD Primary Cardiologist: Julia Gess MD Julia Rose, [place]  HPI:  Julia Rose is a 77 y.o.   moderately overweight married Caucasian female mother of 4 children, grandmother of a grandchildren whose husband Julia Rose is also a patient of mine. She was referred by Dr. Jeannetta Nap for evaluation of recently recognized atrial fibrillation. I last saw her in the office 10/22/2021.  Her cardiac risk factor profile is notable for treated hypertension, diabetes. She has hyperlipidemia intolerant to statin drugs. She's never had a heart stroke. She occasionally gets chest tightness. 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 She was recently found to be in A. Fib during a routine doctor's visit and was referred here for further evaluation. 8. Event monitor showed A. Fib with heart rates as high as 170. 2-D echo revealed normal LV systolic function with mild to moderate aortic stenosis and a Myoview stress test was low risk and nonischemic. Carotid Doppler showed no evidence of ICA stenosis. She was placed on oral anticoagulation. Today she is in atrial fibrillation with a controlled ventricular response on Coumadin anticoagulation. She underwent inpatient sotalol loading in the fall of last year and saw Rudi Coco and the A. fib clinic last month at which time she was in sinus bradycardia. She is back in A. fib. I was unaware of this. Recent 2-D echocardiogram performed 08/31/17 revealed progression of her aortic stenosis with a valve area of 1.08 cm and a peak gradient of 36 mmHg. She did tell me that recently one of her brothers died of ischemic heart disease post valve replacement and bypass grafting and another brother had bypass grafting as well.   Since I saw her a year and a half ago she has remained stable.  She currently is in atrial fibrillation with controlled ventricular  response.  She is more fatigued and dyspneic on exertion.  She remains on Coumadin anticoagulation followed by her PCP.  Her most recent 2D echo performed 09/03/2022 revealed normal EF with moderate aortic stenosis and a valve area of 0.77 cm with a peak gradient of 32 mmHg, up from 21 mm a year ago.   Current Meds  Medication Sig   albuterol (PROVENTIL HFA;VENTOLIN HFA) 108 (90 Base) MCG/ACT inhaler Inhale 2 puffs into the lungs every 6 (six) hours as needed for wheezing or shortness of breath.   amLODipine (NORVASC) 2.5 MG tablet TAKE 1 TABLET BY MOUTH DAILY   atorvastatin (LIPITOR) 20 MG tablet TAKE 1 TABLET BY MOUTH DAILY   citalopram (CELEXA) 20 MG tablet Take 10 mg by mouth daily.   ibuprofen (ADVIL) 200 MG tablet Take 400 mg by mouth every 6 (six) hours as needed for moderate pain.   losartan (COZAAR) 100 MG tablet Take 50 mg by mouth at bedtime.   Magnesium 250 MG TABS Take 250 mg by mouth daily.   metFORMIN (GLUCOPHAGE) 1000 MG tablet Take 500 mg by mouth 2 (two) times daily with a meal.   Nutritional Supplements (JUICE PLUS FIBRE PO) Take 1 capsule by mouth in the morning and at bedtime. Fruit   PHENobarbital (LUMINAL) 97.2 MG tablet Take 97.2 mg by mouth at bedtime.    polyvinyl alcohol (LIQUIFILM TEARS) 1.4 % ophthalmic solution Place 1 drop into both eyes as needed for dry eyes.   Simethicone 180 MG CAPS Take 180 mg by mouth as needed (for gas).  sotalol (BETAPACE) 120 MG tablet TAKE 1 TABLET BY MOUTH EVERY 12 HOURS   warfarin (COUMADIN) 5 MG tablet Take 1 tablet (5 mg total) by mouth daily. (Patient taking differently: Take 7.5-10 mg by mouth See admin instructions. Take 15 mg by mouth daily for one day then take 10 mg by mouth daily for 3 days, then start cycle over)     Allergies  Allergen Reactions   Dilantin [Phenytoin] Hives and Other (See Comments)    Whelps all over for two weeks   Statins Other (See Comments)    High dose statin - dizziness    Social History    Socioeconomic History   Marital status: Married    Spouse name: Not on file   Number of children: Not on file   Years of education: Not on file   Highest education level: Not on file  Occupational History   Not on file  Tobacco Use   Smoking status: Never   Smokeless tobacco: Never  Vaping Use   Vaping Use: Never used  Substance and Sexual Activity   Alcohol use: No   Drug use: No   Sexual activity: Not Currently  Other Topics Concern   Not on file  Social History Narrative   Not on file   Social Determinants of Health   Financial Resource Strain: Not on file  Food Insecurity: Not on file  Transportation Needs: Not on file  Physical Activity: Not on file  Stress: Not on file  Social Connections: Not on file  Intimate Partner Violence: Not on file     Review of Systems: General: negative for chills, fever, night sweats or weight changes.  Cardiovascular: negative for chest pain, dyspnea on exertion, edema, orthopnea, palpitations, paroxysmal nocturnal dyspnea or shortness of breath Dermatological: negative for rash Respiratory: negative for cough or wheezing Urologic: negative for hematuria Abdominal: negative for nausea, vomiting, diarrhea, bright red blood per rectum, melena, or hematemesis Neurologic: negative for visual changes, syncope, or dizziness All other systems reviewed and are otherwise negative except as noted above.    Blood pressure (!) 142/82, pulse 77, height 5' (1.524 m), weight 196 lb 4.8 oz (89 kg), SpO2 96 %.  General appearance: alert and no distress Neck: no adenopathy, no carotid bruit, no JVD, supple, symmetrical, trachea midline, and thyroid not enlarged, symmetric, no tenderness/mass/nodules Lungs: clear to auscultation bilaterally Heart: irregularly irregular rhythm and 2/6 outflow tract murmur consistent with aortic stenosis Extremities: Trace to 1+ lower extremity edema Pulses: 2+ and symmetric Skin: Skin color, texture, turgor  normal. No rashes or lesions Neurologic: Grossly normal  EKG atrial fibrillation with a ventricular sponsor 77, right bundle branch block.  I personally reviewed this EKG.  ASSESSMENT AND PLAN:   PAF (paroxysmal atrial fibrillation) (HCC) History of persistent A-fib rate controlled on Coumadin anticoagulation.  We did talk about transitioning to a DOAC which she is receptive to.  She remains on sotalol.  I think that her fatigue and shortness of breath are somewhat related to her A-fib which she has not more than she is in sinus rhythm.  I am referring her back to the A-fib clinic for further evaluation.  Right bundle branch block Chronic  Essential hypertension History of essential hypertension blood pressure measured today at 142/82.  She is on amlodipine, losartan.  Aortic stenosis, moderate History of moderate aortic stenosis by 2D echo recently performed 09/03/2022.  LV function was normal.  Her aortic valve measured 0.77 cm which is unchanged although her  peak gradient has increased to 32 mmHg from 21.  Will continue to follow this on annual basis.     Julia Gess MD FACP,FACC,FAHA, [facility] 11/17/2022 12:43 PM

## 2022-11-17 NOTE — Patient Instructions (Signed)
Medication Instructions:  Your physician recommends that you continue on your current medications as directed. Please refer to the Current Medication list given to you today.  *If you need a refill on your cardiac medications before your next appointment, please call your pharmacy*   Testing/Procedures: Your physician has requested that you have an echocardiogram. Echocardiography is a painless test that uses sound waves to create images of your heart. It provides your doctor with information about the size and shape of your heart and how well your heart's chambers and valves are working. This procedure takes approximately one hour. There are no restrictions for this procedure. Please do NOT wear cologne, perfume, aftershave, or lotions (deodorant is allowed). Please arrive 15 minutes prior to your appointment time. This procedure will take place at [address] [place] **To do in February 2025**    Follow-Up: At [facility], you and your health needs are our priority.  As part of our continuing mission to provide you with exceptional heart care, we have created designated Provider Care Teams.  These Care Teams include your primary Cardiologist (physician) and Advanced Practice Providers (APPs -  Physician Assistants and Nurse Practitioners) who all work together to provide you with the care you need, when you need it.  We recommend signing up for the patient portal called "MyChart".  Sign up information is provided on this After Visit Summary.  MyChart is used to connect with patients for Virtual Visits (Telemedicine).  Patients are able to view lab/test results, encounter notes, upcoming appointments, etc.  Non-urgent messages can be sent to your provider as well.   To learn more about what you can do with MyChart, go to [email or web address].    Your next appointment:   3 month(s)  Provider:   Micah Flesher, PA-C, Marjie Skiff, PA-C, Juanda Crumble, PA-C, Joni Reining, DNP, ANP, Azalee Course, PA-C, or Bernadene Person, NP      Then, Nanetta Batty, MD will plan to see you again in 6 month(s).

## 2022-11-17 NOTE — Assessment & Plan Note (Signed)
History of essential hypertension blood pressure measured today at 142/82.  She is on amlodipine, losartan.

## 2022-11-20 ENCOUNTER — Other Ambulatory Visit: Payer: Self-pay | Admitting: Cardiovascular Disease

## 2022-11-25 ENCOUNTER — Encounter: Payer: Self-pay | Admitting: Cardiovascular Disease

## 2022-11-25 NOTE — Telephone Encounter (Signed)
She will need to bring in last years tax return and a print out of her out of pocket expenses from the pharmacy she uses

## 2022-11-25 NOTE — Telephone Encounter (Signed)
Daughter wants call back to confirm what documentation patient will need to complete her application for medication assistance with Eliquis.

## 2022-11-26 NOTE — Telephone Encounter (Signed)
Spoke with patient and she is aware of the documentation she needs to bring to the office for patient assistance.   LVM for daughter to return call to office.

## 2022-11-30 ENCOUNTER — Telehealth: Payer: Self-pay | Admitting: Cardiovascular Disease

## 2022-11-30 NOTE — Telephone Encounter (Signed)
Patient daughter dropped off Julia Rose Patient Assistance paperwork for [employer]. Paperwork put in Dr.Berry box on 11/30/22.

## 2022-12-01 ENCOUNTER — Ambulatory Visit (HOSPITAL_COMMUNITY)
Admission: RE | Admit: 2022-12-01 | Discharge: 2022-12-01 | Disposition: A | Discharge status: Home / Self Care | Payer: Medicare Other | Source: Ambulatory Visit | Attending: Internal Medicine | Admitting: Internal Medicine

## 2022-12-01 VITALS — BP 150/98 | HR 92 | Ht 60.0 in | Wt 196.4 lb

## 2022-12-01 DIAGNOSIS — I4819 Other persistent atrial fibrillation: Secondary | ICD-10-CM | POA: Diagnosis present

## 2022-12-01 DIAGNOSIS — Z7901 Long term (current) use of anticoagulants: Secondary | ICD-10-CM | POA: Insufficient documentation

## 2022-12-01 DIAGNOSIS — I10 Essential (primary) hypertension: Secondary | ICD-10-CM | POA: Diagnosis not present

## 2022-12-01 DIAGNOSIS — I480 Paroxysmal atrial fibrillation: Secondary | ICD-10-CM | POA: Diagnosis not present

## 2022-12-01 DIAGNOSIS — D6869 Other thrombophilia: Secondary | ICD-10-CM

## 2022-12-01 NOTE — Progress Notes (Signed)
Patient ID: Julia Rose, female   DOB: 07-08-1946, 77 y.o.   MRN: [id number]     Primary Care Physician: Kaleen Mask, MD Referring Physician: Dr. Jarrett Soho is a 77 y.o. 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 She was referred by Dr. Allyson Sabal for further evaluation 07/12/15.  The afib was paroxysmal at that time. She had been started on edoxaban 30 mg daily for a chadsvasc score of 4. She was aware of fatigue, shortness of breath and chest heaviness when in afib. 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   Returned to afib clinic 09/2016,  after seeing Dr. Allyson Sabal, and pt feeling she has been in persistent afib since  fall. For the most part she is minimally symptomatic. She walks with her daughter when the weather is nice 30 mins to an hour and can do so with minimal symptoms. She had a repeat ECHO with normal EF and moderate aortic stenosis which she was told was  stable. She was asked to be seen here to discuss antiarrythmic therapy. 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 Continues warfarin for a CHA2DS2VASc score of at least 3. She deferred any change in therapy due to up coming trip.   Returned to afib clinic 10/28/16, and wishes to further discuss restoring SR. When she went on her trip, she noticed how short of breath with activity she had become since the trip last year. People in her group were always waiting for her to catch up with them.She reports that when she makes the bed, she has to rest half way in between. Financial concerns would definitely play into what antiarrythmic she would be able to afford. After discussion of available antiarrythmic's, she was not ready to start AAD but was interested in pursuing cardioversion alone to see if she could return to SR and if her symptoms would improve in SR. She also has moderate aortic stenosis that may be playing a part in he symptoms, but by echo  Dr. Allyson Sabal told her this is stable.. She is on warfarin and has had 3 therapeutic warfarin levels checked  with PCP, 5/2 - 2.4, 5/10 - 2.1, 5/16 - 2.3 and INR pending this am with pre procedure labs. Pt aware of risk vrs benefit of procedure and wants to proceed.  F/u afib clinic 5/30 f/u cardioversion. The cardioversion was successful but unfortunately she did return to afib after 3 days. It was long enough for her to notice that she had more energy and less exertional dyspnea. This has made her more interested in pursing SR using an antiarrythmic which she was against before.  F/u 9/20 from hospitalization for sotalol, fortunately, pt did convert with drug and did not require cardioversion. She does have less shortness of breath in SR and more energy. She has done some outdoor walking and deep cleaning at her house which would not have been possible in the past year in afib. She is happy re results.  F/u in afib clinic, 1/15- She continues in SR and continues to experience less shortness of breath. She does have ongoing fatigue, but improved in SR.Marland Kitchen She has refused to have sleep study in the past.   F/u in afib clinic 09/21/17, at the request of Dr. Allyson Sabal, from finding afib at his recent office visit. Today pt is back in SR. She states that she had more stress lately as her  brother had open heart surgery in [facility] and she went up to stay with him. She has had her echo repeated recently and fairly stable with  mod aortic stenosis, diastolic dysfunction.  F/u afib clinic, 5/8, she reports very little breakthrough afib and it is short lived. Happy with sotalol management. Echo for moderate AS is pending 08/2018.  F/u in fib clinic, 11/8. She is in SR and feels well. No issues with meds. Has had to stop walking for arthritic changes in her back and hips. She is interested in the [facility]  [employer] 3 month supervised exercise program.  On follow up today, 12/01/22. She is currently in rate controlled Afib. Seen by Dr.  Allyson Sabal on 11/17/22 and noted to be in rate controlled Afib at that time. She has an Scientist, physiological she wears to monitor her rhythm since the beginning of the year. She did not bring it in today because it was charging. She notes that last week she was in Afib 100% of the time according to the watch; she was in Afib 65% of the time 2 weeks ago. She feels tired. No missed doses of sotalol or coumadin.   Today, she  with minimal  symptoms of palpitations, shortness of breath, no chest pain, orthopnea, PND, lower extremity edema, dizziness, presyncope, syncope, or neurologic sequela. The patient is tolerating medications without difficulties and is otherwise without complaint today.   Past Medical History:  Diagnosis Date   Arthritis    "hands" (04/06/2017)   Asthma    mild with exertion   Atrial fibrillation with RVR (HCC)    Chronic back pain    "from the DDD"   DDD (degenerative disc disease), cervical    C-5-6 and C6-7 are partially herniated   DDD (degenerative disc disease), lumbar    L4-5 and L5-6  and S-1 are herniated discs   DDD (degenerative disc disease), thoracic    "not dx'd yet" (04/06/2017)   Hyperlipidemia    statin intolerant   Hypertension    Moderate aortic stenosis    Right bundle branch block    Seizures (HCC)    "when I was younger; none since the 66s" (04/06/2017)   Type II diabetes mellitus (HCC)    Past Surgical History:  Procedure Laterality Date   CARDIOVERSION N/A 12/17/2016   Procedure: CARDIOVERSION;  Surgeon: Nahser, Deloris Ping, MD;  Location: [facility] ENDOSCOPY;  Service: Cardiovascular;  Laterality: N/A;   DIAGNOSTIC LAPAROSCOPY  1993   "paraovarian cyst removed"   INCISIONAL HERNIA REPAIR N/A 05/29/2021   Procedure: LAPAROSCOPIC INCISIONAL HERNIA REPAIR WITH MESH;  Surgeon: [name], MD;  Location: [facility] ORS;  Service: General;  Laterality: N/A;   TONSILLECTOMY     VAGINAL HYSTERECTOMY  1999   "partial"    Current Outpatient Medications  Medication Sig  Dispense Refill   acetaminophen (TYLENOL 8 HOUR ARTHRITIS PAIN) 650 MG CR tablet Take 650 mg by mouth as needed for pain.     albuterol (PROVENTIL HFA;VENTOLIN HFA) 108 (90 Base) MCG/ACT inhaler Inhale 2 puffs into the lungs every 6 (six) hours as needed for wheezing or shortness of breath.     amLODipine (NORVASC) 2.5 MG tablet TAKE 1 TABLET BY MOUTH DAILY 90 tablet 3   atorvastatin (LIPITOR) 20 MG tablet TAKE 1 TABLET BY MOUTH DAILY 90 tablet 1   citalopram (CELEXA) 20 MG tablet Take 10 mg by mouth daily.     ibuprofen (ADVIL) 200 MG tablet Take 400 mg by mouth every 6 (six)  hours as needed for moderate pain.     losartan (COZAAR) 100 MG tablet Take 50 mg by mouth at bedtime.  0   Magnesium 250 MG TABS Take 250 mg by mouth daily.     metFORMIN (GLUCOPHAGE) 1000 MG tablet Take 500 mg by mouth 2 (two) times daily with a meal.     Nutritional Supplements (JUICE PLUS FIBRE PO) Take 1 capsule by mouth in the morning and at bedtime. Fruit     PHENobarbital (LUMINAL) 97.2 MG tablet Take 97.2 mg by mouth at bedtime.   4   sotalol (BETAPACE) 120 MG tablet TAKE 1 TABLET BY MOUTH EVERY 12 HOURS 120 tablet 3   warfarin (COUMADIN) 5 MG tablet Take 1 tablet (5 mg total) by mouth daily. (Patient taking differently: Take 7.5-10 mg by mouth See admin instructions. Take 15 mg by mouth daily for one day then take 10 mg by mouth daily for 3 days, then start cycle over) 30 tablet 0   polyvinyl alcohol (LIQUIFILM TEARS) 1.4 % ophthalmic solution Place 1 drop into both eyes as needed for dry eyes.     Simethicone 180 MG CAPS Take 180 mg by mouth as needed (for gas).      No current facility-administered medications for this encounter.    Allergies  Allergen Reactions   Dilantin [Phenytoin] Hives and Other (See Comments)    Whelps all over for two weeks   Statins Other (See Comments)    High dose statin - dizziness    Social History   Socioeconomic History   Marital status: Married    Spouse name: Not on  file   Number of children: Not on file   Years of education: Not on file   Highest education level: Not on file  Occupational History   Not on file  Tobacco Use   Smoking status: Never   Smokeless tobacco: Never  Vaping Use   Vaping Use: Never used  Substance and Sexual Activity   Alcohol use: No   Drug use: No   Sexual activity: Not Currently  Other Topics Concern   Not on file  Social History Narrative   Not on file   Social Determinants of Health   Financial Resource Strain: Not on file  Food Insecurity: Not on file  Transportation Needs: Not on file  Physical Activity: Not on file  Stress: Not on file  Social Connections: Not on file  Intimate Partner Violence: Not on file    Family History  Problem Relation Age of Onset   Kidney disease Mother    Diabetes Mother    Dementia Mother    Hypertension Mother     ROS- All systems are reviewed and negative except as per the HPI above  Physical Exam: Vitals:   12/01/22 1117  BP: (!) 150/98  Pulse: 92  Weight: 89.1 kg  Height: 5' (1.524 m)    GEN- The patient is well appearing, alert and oriented x 3 today.   Head- normocephalic, atraumatic Eyes-  Sclera clear, conjunctiva pink Ears- hearing intact Oropharynx- clear Neck- supple, no JVP Lymph- no cervical lymphadenopathy Lungs- Clear to ausculation bilaterally, normal work of breathing Heart- Irregular rate and rhythm, 2/6 systolic murmurs, rubs or gallops, PMI not laterally displaced GI- soft, NT, ND, + BS Extremities- no clubbing, cyanosis, or edema MS- no significant deformity or atrophy Skin- no rash or lesion Psych- euthymic mood, full affect Neuro- strength and sensation are intact   EKG-  Vent. rate 92  BPM PR interval * ms QRS duration 134 ms QT/QTcB 414/511 ms P-R-T axes * 79 19 Atrial fibrillation Right bundle branch block Abnormal ECG When compared with ECG of 03-Jun-2018 09:33, Atrial fibrillation is now present Confirmed by Donato Schultz ([id number]) on 12/01/2022 12:27:47 PM  Echo-09/04/22    1. Left ventricular ejection fraction, by estimation, is 60 to 65%. The  left ventricle has normal function. The left ventricle has no regional  wall motion abnormalities. Left ventricular diastolic parameters were  normal. The average left ventricular  global longitudinal strain is -18.3 %. The global longitudinal strain is  normal.   2. Right ventricular systolic function is moderately reduced. The right  ventricular size is moderately enlarged. There is mildly elevated  pulmonary artery systolic pressure.   3. The mitral valve is abnormal. Mild mitral valve regurgitation. No  evidence of mitral stenosis.   4. Tricuspid valve regurgitation is mild to moderate.   5. The aortic valve is tricuspid. There is moderate calcification of the  aortic valve. There is moderate thickening of the aortic valve. Aortic  valve regurgitation is mild. Moderate aortic valve stenosis.   6. The inferior vena cava is normal in size with greater than 50%  respiratory variability, suggesting right atrial pressure of 3 mmHg.     Assessment and Plan: 1 Persistent afib   She is currently in rate controlled Afib.  After discussion with patient, we will proceed with scheduling DCCV after 4 therapeutic INR checks.   Continue sotalol 120 mg BID.  If she goes back into Afib after cardioversion, can consider AAD therapy such as Tikosyn or amiodarone. Would have to consider change from Celexa to Effexor or Cymbalta which do not affect QT interval. Can also consider ablation procedure.    2. Chadsvasc score of at least 4 Continue warfarin - she has sent a request for patient assistance with Eliquis and waiting to hear back.   She will call clinic once has 2 therapeutic INRs in order to proceed with scheduling DCCV.  3. HTN Elevated today, advised to monitor at home.   F/u 1-2 weeks after DCCV.   Lake Bells, PA-C Afib Clinic [facility] [address] [place], [place] [address] [phone]

## 2022-12-01 NOTE — Patient Instructions (Signed)
Continue weekly INR checks for a total for 4  After next week's check call Ronita Hargreaves to have cardioversion setup [phone]

## 2022-12-09 MED ORDER — APIXABAN 5 MG PO TABS: 5.0000 mg | ORAL_TABLET | Freq: Two times a day (BID) | ORAL | 3 refills | Status: DC

## 2022-12-09 NOTE — Telephone Encounter (Signed)
Pt assistance completed and faxed to [facility]. Confirmation of successful fax received.

## 2022-12-09 NOTE — Addendum Note (Signed)
Addended by: Bernita Buffy on: 12/09/2022 08:05 AM   Modules accepted: Orders

## 2022-12-14 ENCOUNTER — Other Ambulatory Visit (HOSPITAL_COMMUNITY): Payer: Self-pay | Admitting: *Deleted

## 2022-12-14 DIAGNOSIS — I4819 Other persistent atrial fibrillation: Secondary | ICD-10-CM

## 2022-12-25 ENCOUNTER — Telehealth: Payer: Self-pay | Admitting: Cardiovascular Disease

## 2022-12-25 NOTE — Telephone Encounter (Signed)
Pt calling she states she received a notice from her insurance that they did not cover her last appt with Dr. Allyson Sabal and she would like to know if maybe there was an incorrect code put in.    She is also concerned that they will not cover her cardioversion next week and would to know if that has been approved by them or not.

## 2022-12-25 NOTE — Progress Notes (Addendum)
Spoke with  patient,  Procedure scheduled for 1230, Please arrive at the hospital at 1100, NPO after midnight on  Sunday, May take meds with sips of water in the AM, please have transportation for home post procedure, and someone to stay with pt for approximately 24 hours after  Pt states she has been taking coumadin regularly and no missed doses, informed we will check labs Monday

## 2022-12-28 ENCOUNTER — Ambulatory Visit (HOSPITAL_COMMUNITY)
Admission: RE | Admit: 2022-12-28 | Discharge: 2022-12-28 | Disposition: A | Discharge status: Home / Self Care | Payer: Medicare Other | Attending: Internal Medicine | Admitting: Internal Medicine

## 2022-12-28 ENCOUNTER — Encounter (HOSPITAL_COMMUNITY)
Admission: RE | Disposition: A | Discharge status: Home / Self Care | Payer: Self-pay | Source: Home / Self Care | Attending: Internal Medicine

## 2022-12-28 ENCOUNTER — Ambulatory Visit (HOSPITAL_COMMUNITY)
Admission: RE | Admit: 2022-12-28 | Discharge: 2022-12-28 | Disposition: A | Discharge status: Home / Self Care | Payer: Medicare Other | Source: Ambulatory Visit | Attending: Internal Medicine | Admitting: Internal Medicine

## 2022-12-28 DIAGNOSIS — Z538 Procedure and treatment not carried out for other reasons: Secondary | ICD-10-CM | POA: Diagnosis not present

## 2022-12-28 DIAGNOSIS — I480 Paroxysmal atrial fibrillation: Secondary | ICD-10-CM

## 2022-12-28 DIAGNOSIS — I4819 Other persistent atrial fibrillation: Secondary | ICD-10-CM | POA: Diagnosis present

## 2022-12-28 DIAGNOSIS — D6869 Other thrombophilia: Secondary | ICD-10-CM

## 2022-12-28 LAB — BASIC METABOLIC PANEL
Anion gap: 13 (ref 5–15)
BUN: 9 mg/dL (ref 8–23)
CO2: 24 mmol/L (ref 22–32)
Calcium: 9.3 mg/dL (ref 8.9–10.3)
Chloride: 102 mmol/L (ref 98–111)
Creatinine, Ser: 0.77 mg/dL (ref 0.44–1.00)
GFR, Estimated: >60 mL/min (ref >60)
Glucose, Bld: 246 mg/dL — ABNORMAL HIGH (ref 70–99)
Potassium: 4.3 mmol/L (ref 3.5–5.1)
Sodium: 139 mmol/L (ref 135–145)

## 2022-12-28 LAB — CBC
HCT: 43.7 % (ref 36.0–46.0)
Hemoglobin: 14.5 g/dL (ref 12.0–15.0)
MCH: 30.8 pg (ref 26.0–34.0)
MCHC: 33.2 g/dL (ref 30.0–36.0)
MCV: 92.8 fL (ref 80.0–100.0)
Platelets: 239 10*3/uL (ref 150–400)
RBC: 4.71 MIL/uL (ref 3.87–5.11)
RDW: 12.5 % (ref 11.5–15.5)
WBC: 8.1 10*3/uL (ref 4.0–10.5)
nRBC: 0 % (ref 0.0–0.2)

## 2022-12-28 SURGERY — INVASIVE LAB ABORTED CASE

## 2022-12-28 MED ORDER — SODIUM CHLORIDE 0.9 % IV SOLN: INTRAVENOUS | Status: DC

## 2022-12-28 NOTE — Progress Notes (Signed)
Patient arrived no complaints.  Patient bedside monitor showed SR.  EKG done to confirm.  Dr Izora Ribas in to see patients.  Procedure not performed.

## 2022-12-28 NOTE — H&P (Signed)
Patient presented in normal sinus rhythm.  Cardioversion cancelled.  Julia Lam, MD FASE [facility] Cardiologist [facility]  [address] [place], [address] [place], [place] [address] [phone]  11:25 AM

## 2022-12-31 ENCOUNTER — Ambulatory Visit: Payer: Medicare Other | Admitting: Cardiovascular Disease

## 2022-12-31 ENCOUNTER — Telehealth: Payer: Self-pay

## 2022-12-31 NOTE — Telephone Encounter (Signed)
   Pre-operative Risk Assessment    Patient Name: Julia Rose  DOB: 08/27/1945 MRN: [id number]     Request for Surgical Clearance    Procedure:   Surgical Extraction of one (1) tooth  Date of Surgery:  Clearance TBD                                 Surgeon:  Alanson Puls, DDS Surgeon's Group or Practice Name:  [facility] Phone number:  [phone] Fax number:  [phone]   Type of Clearance Requested:   - Medical  - Pharmacy:  Hold Warfarin (Coumadin) Requesting office is asking to hold for 5 days; will pt need SBE   Type of Anesthesia:  Not Indicated   Additional requests/questions:  Does this patient need antibiotics?  Signed, Zada Finders   12/31/2022, 8:33 AM

## 2022-12-31 NOTE — Telephone Encounter (Signed)
    Primary Cardiologist: Nanetta Batty, MD  Chart reviewed as part of pre-operative protocol coverage. Simple dental extractions are considered low risk procedures per guidelines and generally do not require any specific cardiac clearance. It is also generally accepted that for simple extractions and dental cleanings, there is no need to interrupt blood thinner therapy.   SBE prophylaxis is not required for the patient. She does not have any artifical heart valves.  I will route this recommendation to the requesting party via Epic fax function and remove from pre-op pool.  Please call with questions.  Sharlene Dory, PA-C 12/31/2022, 12:34 PM

## 2023-01-07 ENCOUNTER — Ambulatory Visit (HOSPITAL_COMMUNITY)
Admission: RE | Admit: 2023-01-07 | Discharge: 2023-01-07 | Disposition: A | Discharge status: Home / Self Care | Payer: Medicare Other | Source: Ambulatory Visit | Attending: Internal Medicine | Admitting: Internal Medicine

## 2023-01-07 ENCOUNTER — Encounter (HOSPITAL_COMMUNITY): Payer: Self-pay | Admitting: Internal Medicine

## 2023-01-07 VITALS — BP 142/84 | HR 81 | Ht 60.0 in | Wt 195.0 lb

## 2023-01-07 DIAGNOSIS — I10 Essential (primary) hypertension: Secondary | ICD-10-CM | POA: Insufficient documentation

## 2023-01-07 DIAGNOSIS — I4892 Unspecified atrial flutter: Secondary | ICD-10-CM | POA: Insufficient documentation

## 2023-01-07 DIAGNOSIS — D6869 Other thrombophilia: Secondary | ICD-10-CM

## 2023-01-07 DIAGNOSIS — Z79899 Other long term (current) drug therapy: Secondary | ICD-10-CM | POA: Insufficient documentation

## 2023-01-07 DIAGNOSIS — I4819 Other persistent atrial fibrillation: Secondary | ICD-10-CM | POA: Diagnosis present

## 2023-01-07 NOTE — Progress Notes (Signed)
Patient ID: Julia Rose, female   DOB: 07-08-1946, 77 y.o.   MRN: [id number]     Primary Care Physician: Kaleen Mask, MD Referring Physician: Dr. Jarrett Soho is a 77 y.o. 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 She was referred by Dr. Allyson Sabal for further evaluation 07/12/15.  The afib was paroxysmal at that time. She had been started on edoxaban 30 mg daily for a chadsvasc score of 4. She was aware of fatigue, shortness of breath and chest heaviness when in afib. 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   Returned to afib clinic 09/2016,  after seeing Dr. Allyson Sabal, and pt feeling she has been in persistent afib since  fall. For the most part she is minimally symptomatic. She walks with her daughter when the weather is nice 30 mins to an hour and can do so with minimal symptoms. She had a repeat ECHO with normal EF and moderate aortic stenosis which she was told was  stable. She was asked to be seen here to discuss antiarrythmic therapy. 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 Continues warfarin for a CHA2DS2VASc score of at least 3. She deferred any change in therapy due to up coming trip.   Returned to afib clinic 10/28/16, and wishes to further discuss restoring SR. When she went on her trip, she noticed how short of breath with activity she had become since the trip last year. People in her group were always waiting for her to catch up with them.She reports that when she makes the bed, she has to rest half way in between. Financial concerns would definitely play into what antiarrythmic she would be able to afford. After discussion of available antiarrythmic's, she was not ready to start AAD but was interested in pursuing cardioversion alone to see if she could return to SR and if her symptoms would improve in SR. She also has moderate aortic stenosis that may be playing a part in he symptoms, but by echo  Dr. Allyson Sabal told her this is stable.. She is on warfarin and has had 3 therapeutic warfarin levels checked  with PCP, 5/2 - 2.4, 5/10 - 2.1, 5/16 - 2.3 and INR pending this am with pre procedure labs. Pt aware of risk vrs benefit of procedure and wants to proceed.  F/u afib clinic 5/30 f/u cardioversion. The cardioversion was successful but unfortunately she did return to afib after 3 days. It was long enough for her to notice that she had more energy and less exertional dyspnea. This has made her more interested in pursing SR using an antiarrythmic which she was against before.  F/u 9/20 from hospitalization for sotalol, fortunately, pt did convert with drug and did not require cardioversion. She does have less shortness of breath in SR and more energy. She has done some outdoor walking and deep cleaning at her house which would not have been possible in the past year in afib. She is happy re results.  F/u in afib clinic, 1/15- She continues in SR and continues to experience less shortness of breath. She does have ongoing fatigue, but improved in SR.Marland Kitchen She has refused to have sleep study in the past.   F/u in afib clinic 09/21/17, at the request of Dr. Allyson Sabal, from finding afib at his recent office visit. Today pt is back in SR. She states that she had more stress lately as her  brother had open heart surgery in [facility] and she went up to stay with him. She has had her echo repeated recently and fairly stable with  mod aortic stenosis, diastolic dysfunction.  F/u afib clinic, 5/8, she reports very little breakthrough afib and it is short lived. Happy with sotalol management. Echo for moderate AS is pending 08/2018.  F/u in fib clinic, 11/8. She is in SR and feels well. No issues with meds. Has had to stop walking for arthritic changes in her back and hips. She is interested in the [facility]  [employer] 3 month supervised exercise program.  On follow up today, 12/01/22. She is currently in rate controlled Afib. Seen by Dr.  Allyson Sabal on 11/17/22 and noted to be in rate controlled Afib at that time. She has an Scientist, physiological she wears to monitor her rhythm since the beginning of the year. She did not bring it in today because it was charging. She notes that last week she was in Afib 100% of the time according to the watch; she was in Afib 65% of the time 2 weeks ago. She feels tired. No missed doses of sotalol or coumadin.   On follow up 01/07/23, she is currently in rate controlled atrial flutter. She was scheduled for DCCV on 6/3 but cancelled due to arriving in [place]. Since that day, she has gone in and out of abnormal rhythm. She shows me on her phone Apple watch data over the past few months noting periodically 100% Afib burden. She had 100% burden for the majority of May and right before scheduled cardioversion was back in normal rhythm. Currently, she feels asymptomatic at this time.     Today, she  with minimal  symptoms of palpitations, shortness of breath, no chest pain, orthopnea, PND, lower extremity edema, dizziness, presyncope, syncope, or neurologic sequela. The patient is tolerating medications without difficulties and is otherwise without complaint today.   Past Medical History:  Diagnosis Date   Arthritis    "hands" (04/06/2017)   Asthma    mild with exertion   Atrial fibrillation with RVR (HCC)    Chronic back pain    "from the DDD"   DDD (degenerative disc disease), cervical    C-5-6 and C6-7 are partially herniated   DDD (degenerative disc disease), lumbar    L4-5 and L5-6  and S-1 are herniated discs   DDD (degenerative disc disease), thoracic    "not dx'd yet" (04/06/2017)   Hyperlipidemia    statin intolerant   Hypertension    Moderate aortic stenosis    Right bundle branch block    Seizures (HCC)    "when I was younger; none since the 91s" (04/06/2017)   Type II diabetes mellitus (HCC)    Past Surgical History:  Procedure Laterality Date   CARDIOVERSION N/A 12/17/2016   Procedure:  CARDIOVERSION;  Surgeon: Nahser, Deloris Ping, MD;  Location: [facility] ENDOSCOPY;  Service: Cardiovascular;  Laterality: N/A;   DIAGNOSTIC LAPAROSCOPY  1993   "paraovarian cyst removed"   INCISIONAL HERNIA REPAIR N/A 05/29/2021   Procedure: LAPAROSCOPIC INCISIONAL HERNIA REPAIR WITH MESH;  Surgeon: [name], MD;  Location: [facility] ORS;  Service: General;  Laterality: N/A;   TONSILLECTOMY     VAGINAL HYSTERECTOMY  1999   "partial"    Current Outpatient Medications  Medication Sig Dispense Refill   acetaminophen (TYLENOL 8 HOUR ARTHRITIS PAIN) 650 MG CR tablet Take 650 mg by mouth every 8 (eight) hours as needed for pain.  albuterol (PROVENTIL HFA;VENTOLIN HFA) 108 (90 Base) MCG/ACT inhaler Inhale 2 puffs into the lungs every 6 (six) hours as needed for wheezing or shortness of breath.     amLODipine (NORVASC) 2.5 MG tablet TAKE 1 TABLET BY MOUTH DAILY (Patient taking differently: Take 2.5 mg by mouth at bedtime.) 90 tablet 3   atorvastatin (LIPITOR) 20 MG tablet TAKE 1 TABLET BY MOUTH DAILY (Patient taking differently: Take 20 mg by mouth at bedtime.) 90 tablet 1   citalopram (CELEXA) 20 MG tablet Take 10 mg by mouth in the morning.     ibuprofen (ADVIL) 200 MG tablet Take 400 mg by mouth every 6 (six) hours as needed for moderate pain.     losartan (COZAAR) 100 MG tablet Take 25 mg by mouth at bedtime.  0   Magnesium 250 MG TABS Take 250 mg by mouth in the morning.     metFORMIN (GLUCOPHAGE) 1000 MG tablet Take 500 mg by mouth 2 (two) times daily with a meal.     Nutritional Supplements (JUICE PLUS FIBRE PO) Take 3 capsules by mouth in the morning and at bedtime. Fruit, berry and veg.     PHENObarbital (LUMINAL) 100 MG tablet Take 100 mg by mouth at bedtime.  4   polyvinyl alcohol (LIQUIFILM TEARS) 1.4 % ophthalmic solution Place 1 drop into both eyes as needed for dry eyes.     Simethicone 180 MG CAPS Take 180 mg by mouth as needed (for gas).      sotalol (BETAPACE) 120 MG tablet TAKE 1 TABLET BY  MOUTH EVERY 12 HOURS 120 tablet 3   warfarin (COUMADIN) 5 MG tablet Take 1 tablet (5 mg total) by mouth daily. (Patient taking differently: Take 7.5-10 mg by mouth See admin instructions. Take 7.5 mg & 10 mg alternating every other day) 30 tablet 0   No current facility-administered medications for this encounter.    Allergies  Allergen Reactions   Dilantin [Phenytoin] Hives and Other (See Comments)    Whelps all over for two weeks   Statins Other (See Comments)    High dose statin - dizziness    Social History   Socioeconomic History   Marital status: Married    Spouse name: Not on file   Number of children: Not on file   Years of education: Not on file   Highest education level: Not on file  Occupational History   Not on file  Tobacco Use   Smoking status: Never   Smokeless tobacco: Never  Vaping Use   Vaping Use: Never used  Substance and Sexual Activity   Alcohol use: No   Drug use: No   Sexual activity: Not Currently  Other Topics Concern   Not on file  Social History Narrative   Not on file   Social Determinants of Health   Financial Resource Strain: Not on file  Food Insecurity: Not on file  Transportation Needs: Not on file  Physical Activity: Not on file  Stress: Not on file  Social Connections: Not on file  Intimate Partner Violence: Not on file    Family History  Problem Relation Age of Onset   Kidney disease Mother    Diabetes Mother    Dementia Mother    Hypertension Mother     ROS- All systems are reviewed and negative except as per the HPI above  Physical Exam: Vitals:   01/07/23 1125  BP: (!) 142/84  Pulse: 81  Weight: 88.5 kg  Height: 5' (1.524 m)  GEN- The patient is well appearing, alert and oriented x 3 today.   Head- normocephalic, atraumatic Eyes-  Sclera clear, conjunctiva pink Ears- hearing intact Lungs- Clear to ausculation bilaterally, normal work of breathing Heart- Irregular rate and rhythm, no murmurs, rubs or  gallops, PMI not laterally displaced Extremities- no clubbing, cyanosis, or edema MS- no significant deformity or atrophy Skin- no rash or lesion Psych- euthymic mood, full affect Neuro- strength and sensation are intact  EKG-  Vent. rate 81 BPM PR interval * ms QRS duration 130 ms QT/QTcB 424/492 ms P-R-T axes * 87 49 Atrial flutter with variable A-V block Right bundle branch block Abnormal ECG When compared with ECG of 28-Dec-2022 11:21, PREVIOUS ECG IS PRESENT  Echo-09/04/22    1. Left ventricular ejection fraction, by estimation, is 60 to 65%. The  left ventricle has normal function. The left ventricle has no regional  wall motion abnormalities. Left ventricular diastolic parameters were  normal. The average left ventricular  global longitudinal strain is -18.3 %. The global longitudinal strain is  normal.   2. Right ventricular systolic function is moderately reduced. The right  ventricular size is moderately enlarged. There is mildly elevated  pulmonary artery systolic pressure.   3. The mitral valve is abnormal. Mild mitral valve regurgitation. No  evidence of mitral stenosis.   4. Tricuspid valve regurgitation is mild to moderate.   5. The aortic valve is tricuspid. There is moderate calcification of the  aortic valve. There is moderate thickening of the aortic valve. Aortic  valve regurgitation is mild. Moderate aortic valve stenosis.   6. The inferior vena cava is normal in size with greater than 50%  respiratory variability, suggesting right atrial pressure of 3 mmHg.     Assessment and Plan: Persistent afib / atrial flutter  She is currently in rate controlled atrial flutter today.   After discussion with patient, we are in agreement that sotalol at this point appear to be a failure in controlling her arrhythmia. We discussed options such as Tikosyn or amiodarone. Would have to consider change from Celexa to Effexor or Cymbalta which do not affect QT interval.  Can also consider ablation procedure but she mentioned being hesitant about invasive procedures. She is leaning towards Tikosyn admission but would like to discuss with her daughters and will call us back with decision.   2. Chadsvasc score of at least 4 Continue warfarin - she has sent a request for patient assistance with Eliquis and waiting to hear back.   3. HTN Elevated today, advised to monitor at home.   Patient will call with decision.   Lake Bells, PA-C Afib Clinic [facility] [address] [place], [place] [address] [phone]

## 2023-01-07 NOTE — Patient Instructions (Signed)
Atrial Flutter  Amiodarone   Call with decision [phone] Kenny[place])

## 2023-01-12 ENCOUNTER — Encounter (HOSPITAL_COMMUNITY): Payer: Self-pay

## 2023-01-12 ENCOUNTER — Telehealth (HOSPITAL_COMMUNITY): Payer: Self-pay | Admitting: *Deleted

## 2023-01-12 DIAGNOSIS — I4819 Other persistent atrial fibrillation: Secondary | ICD-10-CM

## 2023-01-12 DIAGNOSIS — I480 Paroxysmal atrial fibrillation: Secondary | ICD-10-CM

## 2023-01-12 NOTE — Telephone Encounter (Signed)
Pt would like to discuss afib ablation as next step. Referral placed.

## 2023-01-18 ENCOUNTER — Other Ambulatory Visit: Payer: Self-pay | Admitting: Family Medicine

## 2023-01-18 DIAGNOSIS — R2689 Other abnormalities of gait and mobility: Secondary | ICD-10-CM

## 2023-01-26 ENCOUNTER — Ambulatory Visit
Admission: RE | Admit: 2023-01-26 | Discharge: 2023-01-26 | Disposition: A | Discharge status: Home / Self Care | Payer: Medicare Other | Source: Ambulatory Visit | Attending: Family Medicine | Admitting: Family Medicine

## 2023-01-26 DIAGNOSIS — R2689 Other abnormalities of gait and mobility: Secondary | ICD-10-CM

## 2023-02-17 ENCOUNTER — Ambulatory Visit: Payer: Medicare Other | Admitting: Physician Assistant

## 2023-02-17 NOTE — Progress Notes (Signed)
This encounter was created in error - please disregard.

## 2023-02-22 ENCOUNTER — Ambulatory Visit: Payer: Medicare Other | Attending: Cardiovascular Disease | Admitting: Cardiovascular Disease

## 2023-02-22 ENCOUNTER — Encounter: Payer: Self-pay | Admitting: Cardiovascular Disease

## 2023-02-22 VITALS — BP 118/62 | HR 71 | Ht 60.0 in | Wt 189.0 lb

## 2023-02-22 DIAGNOSIS — I4819 Other persistent atrial fibrillation: Secondary | ICD-10-CM | POA: Diagnosis not present

## 2023-02-22 NOTE — Patient Instructions (Signed)
Medication Instructions:  STOP Sotalol  Tikosyn Load - we will contact you to set this up as it requires hospitalization, in the meantime contact your pharmacy/insurance to check on cost  *If you need a refill on your cardiac medications before your next appointment, please call your pharmacy*   Testing/Procedures: Atrial Fibrillation Ablation - we will contact you to set this up  Your physician has recommended that you have an ablation. Catheter ablation is a medical procedure used to treat some cardiac arrhythmias (irregular heartbeats). During catheter ablation, a long, thin, flexible tube is put into a blood vessel in your groin (upper thigh), or neck. This tube is called an ablation catheter. It is then guided to your heart through the blood vessel. Radio frequency waves destroy small areas of heart tissue where abnormal heartbeats may cause an arrhythmia to start. Please see the instruction sheet given to you today.   Follow-Up: At [facility], you and your health needs are our priority.  As part of our continuing mission to provide you with exceptional heart care, we have created designated Provider Care Teams.  These Care Teams include your primary Cardiologist (physician) and Advanced Practice Providers (APPs -  Physician Assistants and Nurse Practitioners) who all work together to provide you with the care you need, when you need it.  We recommend signing up for the patient portal called "MyChart".  Sign up information is provided on this After Visit Summary.  MyChart is used to connect with patients for Virtual Visits (Telemedicine).  Patients are able to view lab/test results, encounter notes, upcoming appointments, etc.  Non-urgent messages can be sent to your provider as well.   To learn more about what you can do with MyChart, go to [email or web address].    Your next appointment:   Prior to ablation  Provider:   York Pellant, MD

## 2023-02-22 NOTE — Progress Notes (Signed)
Electrophysiology Office Note:    Date:  02/22/2023   ID:  Julia Rose, DOB 24-Jan-1946, MRN [id number]  PCP:  Kaleen Mask, MD   [facility] Providers Cardiologist:  Nanetta Batty, MD Electrophysiologist:  Maurice Small, MD     Referring MD: Eustace Pen, PA-C   History of Present Illness:    Julia Rose is a 77 y.o. female with a medical history significant for atrial flutter, atrial fibrillation, moderate aortic stenosis referred for arrhythmia management.     She was originally referred to EP by Dr. Gery Pray in December 2016.  Her atrial fibrillation was paroxysmal at that time, she complained of fatigue, shortness of breath, and chest heaviness.  In 2018, her atrial fibrillation was classified as persistent, and she continued to have shortness of breath and fatigue with exertion.    She underwent cardioversion but had recurrence of atrial fibrillation within 3 days.  She was then managed with sotalol.  She cardioverted to sinus rhythm during the drug load.  In follow-up over the subsequent years, she continued to have paroxysmal atrial fibrillation.  In May 2024, she had progressed to AF persistent despite sotalol.  In June 2024, she was noted to be in a rate controlled atrial flutter.  At this point, she has been persistently in either atrial fibrillation or flutter for the past few months          EKGs/Labs/Other Studies Reviewed Today:    Echocardiogram:  TTE September 03, 2022 EF 60 to 65%.  RV is moderately enlarged with mild elevated pulmonary artery systolic pressure.  There is mild mitral regurgitation.  The left and right atria are normal in size   Monitors:   Stress testing:   Advanced imaging:   Cardiac catherization   EKG:   EKG Interpretation Date/Time:  Monday February 22 2023 11:49:53 EDT Ventricular Rate:  71 PR Interval:    QRS Duration:  140 QT Interval:  454 QTC Calculation: 493 R Axis:   81  Text  Interpretation: Atrial flutter with variable A-V block Right bundle branch block When compared with ECG of 07-Jan-2023 11:33, No significant change was found Confirmed by York Pellant [phone]) on 02/22/2023 11:56:15 AM     Physical Exam:    VS:  BP 118/62   Pulse 71   Ht 5' (1.524 m)   Wt 189 lb (85.7 kg)   SpO2 95%   BMI 36.91 kg/m     Wt Readings from Last 3 Encounters:  02/22/23 189 lb (85.7 kg)  01/07/23 195 lb (88.5 kg)  12/01/22 196 lb 6.4 oz (89.1 kg)     GEN:  Well nourished, well developed in no acute distress CARDIAC: iRRR, no murmurs, rubs, gallops RESPIRATORY:  Normal work of breathing MUSCULOSKELETAL: no edema    ASSESSMENT & PLAN:    Persistent atrial fibrillation and flutter Diagnosed in 2016, managed with sotalol She has continued to have paroxysms over the past several years, now persistently in AF on sotalol Will discontinue sotalol We discussed management options.  Because she is very symptomatic, she would like to pursue rhythm control We discussed the ablation procedure, and she would like to make arrangements for this I would like to see her lose weight since she has a significant amount of central obesity In the interim, we will plan for Tikosyn load -- we discussed risks of this medication, need for careful monitoring and hospitalization for initial dosing.  She will investigate cost   We  discussed the indication, rationale, logistics, anticipated benefits, and potential risks of the ablation procedure including but not limited to -- bleed at the groin access site, chest pain, damage to nearby organs such as the diaphragm, lungs, or esophagus, need for a drainage tube, or prolonged hospitalization. I explained that the risk for stroke, heart attack, need for open chest surgery, or even death is very low but not zero. she  expressed understanding and wishes to proceed.   Secondary hypercoagulable state Continue warfarin  Obesity Discussed the  importance of weight loss for maintenance of sinus rhythm. Will make an effort to lose weight prior to her ablation    Signed, Maurice Small, MD  02/22/2023 5:49 PM    [facility]

## 2023-03-01 ENCOUNTER — Telehealth: Payer: Self-pay | Admitting: Pharmacist

## 2023-03-01 NOTE — Telephone Encounter (Signed)
Medication list reviewed in anticipation of upcoming Tikosyn initiation. Patient is taking 1 contraindicated (citalopram) medication and 1 QTc prolonging medications (albuterol). Patient is also on metformin which can increase the concentration of Tikosyn, increasing the risk for QTC prolongation.  Patient will need to be transitioned to a safer antidepressant, such as an SNRI.  The interaction with metformin and albuterol should just be monitored closely.   Patient is anticoagulated on warfarin. Patient will need to have 3 consecutive weekly therapeutic INRs and a therapeutic INR on day of admission.  Patient will need to be counseled to avoid use of Benadryl while on Tikosyn and in the 2-3 days prior to Tikosyn initiation.

## 2023-03-04 NOTE — Addendum Note (Signed)
Addended by: Shona Simpson on: 03/04/2023 04:21 PM   Modules accepted: Orders

## 2023-03-04 NOTE — Telephone Encounter (Signed)
Pt will start weekly INR checks through her PCP and will have levels faxed to our office. She will have INR morning of admission performed in our office.   Citalopram was stopped 2 weeks ago and she is not replacing the medication at this time. She will contact our office if she needs to start another agent.

## 2023-03-15 ENCOUNTER — Other Ambulatory Visit (HOSPITAL_COMMUNITY): Payer: Self-pay | Admitting: *Deleted

## 2023-03-15 DIAGNOSIS — I4819 Other persistent atrial fibrillation: Secondary | ICD-10-CM

## 2023-04-02 ENCOUNTER — Encounter (HOSPITAL_COMMUNITY): Payer: Self-pay

## 2023-04-06 ENCOUNTER — Ambulatory Visit (HOSPITAL_BASED_OUTPATIENT_CLINIC_OR_DEPARTMENT_OTHER)
Admission: RE | Admit: 2023-04-06 | Discharge: 2023-04-06 | Disposition: A | Discharge status: Home / Self Care | Payer: Medicare Other | Source: Ambulatory Visit | Attending: Physician Assistant | Admitting: Physician Assistant

## 2023-04-06 ENCOUNTER — Other Ambulatory Visit: Payer: Self-pay

## 2023-04-06 ENCOUNTER — Other Ambulatory Visit (HOSPITAL_COMMUNITY): Payer: Self-pay

## 2023-04-06 ENCOUNTER — Inpatient Hospital Stay (HOSPITAL_COMMUNITY)
Admission: AD | Admit: 2023-04-06 | Discharge: 2023-04-09 | DRG: 309 | Disposition: A | Discharge status: Home / Self Care | Payer: Medicare Other | Source: Ambulatory Visit | Attending: Cardiovascular Disease | Admitting: Cardiovascular Disease

## 2023-04-06 ENCOUNTER — Encounter (HOSPITAL_COMMUNITY): Payer: Self-pay | Admitting: Cardiovascular Disease

## 2023-04-06 VITALS — BP 150/84 | HR 73 | Ht 60.0 in | Wt 180.4 lb

## 2023-04-06 DIAGNOSIS — I4892 Unspecified atrial flutter: Secondary | ICD-10-CM | POA: Insufficient documentation

## 2023-04-06 DIAGNOSIS — Z7984 Long term (current) use of oral hypoglycemic drugs: Secondary | ICD-10-CM | POA: Diagnosis not present

## 2023-04-06 DIAGNOSIS — Z79899 Other long term (current) drug therapy: Secondary | ICD-10-CM

## 2023-04-06 DIAGNOSIS — I484 Atypical atrial flutter: Secondary | ICD-10-CM | POA: Diagnosis present

## 2023-04-06 DIAGNOSIS — I10 Essential (primary) hypertension: Secondary | ICD-10-CM | POA: Insufficient documentation

## 2023-04-06 DIAGNOSIS — R9431 Abnormal electrocardiogram [ECG] [EKG]: Secondary | ICD-10-CM | POA: Insufficient documentation

## 2023-04-06 DIAGNOSIS — E119 Type 2 diabetes mellitus without complications: Secondary | ICD-10-CM | POA: Diagnosis present

## 2023-04-06 DIAGNOSIS — I4510 Unspecified right bundle-branch block: Secondary | ICD-10-CM | POA: Diagnosis present

## 2023-04-06 DIAGNOSIS — I4819 Other persistent atrial fibrillation: Secondary | ICD-10-CM

## 2023-04-06 DIAGNOSIS — Z23 Encounter for immunization: Secondary | ICD-10-CM

## 2023-04-06 DIAGNOSIS — Z6835 Body mass index (BMI) 35.0-35.9, adult: Secondary | ICD-10-CM

## 2023-04-06 DIAGNOSIS — E785 Hyperlipidemia, unspecified: Secondary | ICD-10-CM | POA: Diagnosis present

## 2023-04-06 DIAGNOSIS — E669 Obesity, unspecified: Secondary | ICD-10-CM | POA: Insufficient documentation

## 2023-04-06 DIAGNOSIS — Z7901 Long term (current) use of anticoagulants: Secondary | ICD-10-CM

## 2023-04-06 DIAGNOSIS — J45909 Unspecified asthma, uncomplicated: Secondary | ICD-10-CM | POA: Diagnosis present

## 2023-04-06 DIAGNOSIS — Z5986 Financial insecurity: Secondary | ICD-10-CM

## 2023-04-06 DIAGNOSIS — I350 Nonrheumatic aortic (valve) stenosis: Secondary | ICD-10-CM | POA: Insufficient documentation

## 2023-04-06 DIAGNOSIS — D6869 Other thrombophilia: Secondary | ICD-10-CM | POA: Diagnosis present

## 2023-04-06 DIAGNOSIS — Z888 Allergy status to other drugs, medicaments and biological substances status: Secondary | ICD-10-CM

## 2023-04-06 LAB — BASIC METABOLIC PANEL
Anion gap: 12 (ref 5–15)
BUN: 16 mg/dL (ref 8–23)
CO2: 23 mmol/L (ref 22–32)
Calcium: 9.2 mg/dL (ref 8.9–10.3)
Chloride: 103 mmol/L (ref 98–111)
Creatinine, Ser: 0.9 mg/dL (ref 0.44–1.00)
GFR, Estimated: >60 mL/min (ref >60)
Glucose, Bld: 114 mg/dL — ABNORMAL HIGH (ref 70–99)
Potassium: 4 mmol/L (ref 3.5–5.1)
Sodium: 138 mmol/L (ref 135–145)

## 2023-04-06 LAB — MAGNESIUM: Magnesium: 1.8 mg/dL (ref 1.7–2.4)

## 2023-04-06 LAB — PROTIME-INR
INR: 2.4 — ABNORMAL HIGH (ref 0.8–1.2)
Prothrombin Time: 26.3 s — ABNORMAL HIGH (ref 11.4–15.2)

## 2023-04-06 MED ORDER — WARFARIN SODIUM 7.5 MG PO TABS
7.5000 mg | ORAL_TABLET | Freq: Every day | ORAL | Status: AC
  Administered 2023-04-06 – 2023-04-07 (×2): 7.5 mg via ORAL
  Filled 2023-04-06 (×2): qty 1

## 2023-04-06 MED ORDER — SODIUM CHLORIDE 0.9% FLUSH
3.0000 mL | Freq: Two times a day (BID) | INTRAVENOUS | Status: DC
  Administered 2023-04-06 – 2023-04-08 (×6): 3 mL via INTRAVENOUS

## 2023-04-06 MED ORDER — WARFARIN - PHARMACIST DOSING INPATIENT: Freq: Every day | Status: DC

## 2023-04-06 MED ORDER — MAGNESIUM OXIDE -MG SUPPLEMENT 400 (240 MG) MG PO TABS
200.0000 mg | ORAL_TABLET | Freq: Every morning | ORAL | Status: DC
  Administered 2023-04-07 – 2023-04-09 (×3): 200 mg via ORAL
  Filled 2023-04-06 (×3): qty 1

## 2023-04-06 MED ORDER — MAGNESIUM SULFATE 4 GM/100ML IV SOLN
4.0000 g | Freq: Once | INTRAVENOUS | Status: AC
  Administered 2023-04-06: 4 g via INTRAVENOUS
  Filled 2023-04-06: qty 100

## 2023-04-06 MED ORDER — SODIUM CHLORIDE 0.9% FLUSH: 3.0000 mL | INTRAVENOUS | Status: DC | PRN

## 2023-04-06 MED ORDER — METFORMIN HCL 500 MG PO TABS
1000.0000 mg | ORAL_TABLET | Freq: Every day | ORAL | Status: DC
  Administered 2023-04-07 – 2023-04-09 (×3): 1000 mg via ORAL
  Filled 2023-04-06 (×3): qty 2

## 2023-04-06 MED ORDER — WARFARIN SODIUM 5 MG PO TABS: 10.0000 mg | ORAL_TABLET | Freq: Once | ORAL | Status: DC

## 2023-04-06 MED ORDER — ATORVASTATIN CALCIUM 10 MG PO TABS
20.0000 mg | ORAL_TABLET | Freq: Every day | ORAL | Status: DC
  Administered 2023-04-06 – 2023-04-08 (×3): 20 mg via ORAL
  Filled 2023-04-06 (×3): qty 2

## 2023-04-06 MED ORDER — ALBUTEROL SULFATE (2.5 MG/3ML) 0.083% IN NEBU: 3.0000 mL | INHALATION_SOLUTION | Freq: Four times a day (QID) | RESPIRATORY_TRACT | Status: DC | PRN

## 2023-04-06 MED ORDER — LOSARTAN POTASSIUM 25 MG PO TABS
25.0000 mg | ORAL_TABLET | Freq: Every day | ORAL | Status: DC
  Administered 2023-04-06 – 2023-04-08 (×3): 25 mg via ORAL
  Filled 2023-04-06 (×3): qty 1

## 2023-04-06 MED ORDER — ACETAMINOPHEN 325 MG PO TABS
650.0000 mg | ORAL_TABLET | Freq: Four times a day (QID) | ORAL | Status: DC | PRN
  Administered 2023-04-08 (×2): 650 mg via ORAL
  Filled 2023-04-06 (×2): qty 2

## 2023-04-06 MED ORDER — POLYVINYL ALCOHOL 1.4 % OP SOLN: 1.0000 [drp] | OPHTHALMIC | Status: DC | PRN

## 2023-04-06 MED ORDER — INFLUENZA VAC A&B SURF ANT ADJ 0.5 ML IM SUSY
0.5000 mL | PREFILLED_SYRINGE | INTRAMUSCULAR | Status: AC
  Administered 2023-04-09: 0.5 mL via INTRAMUSCULAR
  Filled 2023-04-06: qty 0.5

## 2023-04-06 MED ORDER — JUICE PLUS FIBRE PO LIQD: Freq: Two times a day (BID) | ORAL | Status: DC

## 2023-04-06 MED ORDER — SODIUM CHLORIDE 0.9 % IV SOLN
250.0000 mL | INTRAVENOUS | Status: DC | PRN
  Administered 2023-04-08: 250 mL via INTRAVENOUS

## 2023-04-06 MED ORDER — METFORMIN HCL 500 MG PO TABS
500.0000 mg | ORAL_TABLET | Freq: Every evening | ORAL | Status: DC
  Administered 2023-04-06 – 2023-04-08 (×3): 500 mg via ORAL
  Filled 2023-04-06 (×4): qty 1

## 2023-04-06 MED ORDER — DOFETILIDE 500 MCG PO CAPS
500.0000 ug | ORAL_CAPSULE | Freq: Two times a day (BID) | ORAL | Status: DC
  Administered 2023-04-06 – 2023-04-07 (×2): 500 ug via ORAL
  Filled 2023-04-06 (×2): qty 1

## 2023-04-06 MED ORDER — AMLODIPINE BESYLATE 2.5 MG PO TABS
2.5000 mg | ORAL_TABLET | Freq: Every day | ORAL | Status: DC
  Administered 2023-04-06 – 2023-04-08 (×3): 2.5 mg via ORAL
  Filled 2023-04-06 (×3): qty 1

## 2023-04-06 MED ORDER — OXYBUTYNIN CHLORIDE ER 5 MG PO TB24
5.0000 mg | ORAL_TABLET | Freq: Every day | ORAL | Status: DC
  Administered 2023-04-06 – 2023-04-08 (×3): 5 mg via ORAL
  Filled 2023-04-06 (×4): qty 1

## 2023-04-06 MED ORDER — SEMAGLUTIDE 3 MG PO TABS: 1.0000 | ORAL_TABLET | Freq: Every morning | ORAL | Status: DC

## 2023-04-06 NOTE — TOC Benefit Eligibility Note (Signed)
Patient Product/process development scientist completed.    The patient is insured through [facility]. Patient has Medicare and is not eligible for a copay card, but may be able to apply for patient assistance, if available.    Ran test claim for dofetilide (Tikosyn) 500 mcg capsules and the current 30 day co-pay is $46.66.   This test claim was processed through [facility]- copay amounts may vary at other pharmacies due to pharmacy/plan contracts, or as the patient moves through the different stages of their insurance plan.     Roland Earl, CPHT Pharmacy Technician III Certified Patient Advocate [facility] Pharm[facility] Patient Advocate Team Direct Number: [phone]  Fax: [phone]

## 2023-04-06 NOTE — Progress Notes (Signed)
ANTICOAGULATION CONSULT NOTE - Initial Consult  Pharmacy Consult for warfarin Indication: atrial fibrillation  Allergies  Allergen Reactions   Dilantin [Phenytoin] Hives and Other (See Comments)    Whelps all over for two weeks   Statins Other (See Comments)    High dose statin - dizziness    Patient Measurements:    Vital Signs: BP: 150/84 (09/10 0920) Pulse Rate: 73 (09/10 0920)  Labs: Recent Labs    04/06/23 0922 04/06/23 1022  LABPROT  --  26.3*  INR  --  2.4*  CREATININE 0.90  --     Estimated Creatinine Clearance: 49.6 mL/min (by C-G formula based on SCr of 0.9 mg/dL).  Medical History: Past Medical History:  Diagnosis Date   Arthritis    "hands" (04/06/2017)   Asthma    mild with exertion   Atrial fibrillation with RVR (HCC)    Chronic back pain    "from the DDD"   DDD (degenerative disc disease), cervical    C-5-6 and C6-7 are partially herniated   DDD (degenerative disc disease), lumbar    L4-5 and L5-6  and S-1 are herniated discs   DDD (degenerative disc disease), thoracic    "not dx'd yet" (04/06/2017)   Hyperlipidemia    statin intolerant   Hypertension    Moderate aortic stenosis    Right bundle branch block    Seizures (HCC)    "when I was younger; none since the 66s" (04/06/2017)   Type II diabetes mellitus (HCC)      Assessment: 77 yo female presents for Tikosyn initiation.  Anticoagulated with warfarin PTA - 7.5 mg Tu, Wed then 10 mg Thurs (repeats every 3 days).  INR 2.4 therapeutic.    Goal of Therapy:  INR 2-3 Monitor platelets by anticoagulation protocol: Yes   Plan:  Restart home regimen - 7.5 mg Tu/Wed, 10 mg Thurs Daily INR Monitor for s/sx of bleeding  Trixie Rude, PharmD Clinical Pharmacist 04/06/2023  2:30 PM

## 2023-04-06 NOTE — Progress Notes (Signed)
Pharmacy: Dofetilide (Tikosyn) - Initial Consult Assessment and Electrolyte Replacement  Pharmacy consulted to assist in monitoring and replacing electrolytes in this 77 y.o. female admitted on 04/06/2023 undergoing dofetilide initiation. First dofetilide dose: 04/06/2023 @2000   Assessment:  Patient Exclusion Criteria: If any screening criteria checked as "Yes", then  patient  should NOT receive dofetilide until criteria item is corrected.  If "Yes" please indicate correction plan.  YES  NO Patient  Exclusion Criteria Correction Plan   []   [x]   Baseline QTc interval is greater than or equal to 440 msec. IF above YES box checked dofetilide contraindicated unless patient has ICD; then may proceed if QTc 500-550 msec or with known ventricular conduction abnormalities may proceed with QTc 550-600 msec. QTcB = 434 ms per clinic note    []   [x]   Patient is known or suspected to have a digoxin level greater than 2 ng/ml: No results found for: "DIGOXIN"     []   [x]   Creatinine clearance less than 20 ml/min (calculated using Cockcroft-Gault, actual body weight and serum creatinine): Estimated Creatinine Clearance: 49.6 mL/min (by C-G formula based on SCr of 0.9 mg/dL). CrCl 67 mL/min (actual BW)    []   [x]  Patient has received drugs known to prolong the QT intervals within the last 48 hours (phenothiazines, tricyclics or tetracyclic antidepressants, erythromycin, H-1 antihistamines, cisapride, fluoroquinolones, azithromycin, ondansetron).   Updated information on QT prolonging agents is available to be searched on the following database:QT prolonging agents     []   [x]   Patient received a dose of hydrochlorothiazide (Oretic) alone or in any combination including triamterene (Dyazide, Maxzide) in the last 48 hours.    []   [x]  Patient received a medication known to increase dofetilide plasma concentrations prior to initial dofetilide dose:  Trimethoprim (Primsol, Proloprim) in the last 36  hours Verapamil (Calan, Verelan) in the last 36 hours or a sustained release dose in the last 72 hours Megestrol (Megace) in the last 5 days  Cimetidine (Tagamet) in the last 6 hours Ketoconazole (Nizoral) in the last 24 hours Itraconazole (Sporanox) in the last 48 hours  Prochlorperazine (Compazine) in the last 36 hours     []   [x]   Patient is known to have a history of torsades de pointes; congenital or acquired long QT syndromes.    []   [x]   Patient has received a Class 1 antiarrhythmic with less than 2 half-lives since last dose. (Disopyramide, Quinidine, Procainamide, Lidocaine, Mexiletine, Flecainide, Propafenone)    []   [x]   Patient has received amiodarone therapy in the past 3 months or amiodarone level is greater than 0.3 ng/ml.    Labs:    Component Value Date/Time   K 4.0 04/06/2023 0922   MG 1.8 04/06/2023 4696     Plan: Select One Calculated CrCl  Dose q12h  [x]  > 60 ml/min 500 mcg  []  40-60 ml/min 250 mcg  []  20-40 ml/min 125 mcg   [x]   Physician selected initial dose within range recommended for patients level of renal function - will monitor for response.  []   Physician selected initial dose outside of range recommended for patients level of renal function - will discuss if the dose should be altered at this time.   Patient has been appropriately anticoagulated with warfarin for the past 3 weeks.  Confirmed no recent benadryl use and has been off of Celexa since end of July.  Potassium: K >/= 4: Appropriate to initiate Tikosyn, no replacement needed    Magnesium: Mg 1.8-2: Give  Mg 4 gm IV x1 to prevent Mg from dropping below 1.8 - do not need to recheck Mg. Appropriate to initiate Tikosyn   Thank you for allowing pharmacy to participate in this patient's care   Trixie Rude, PharmD Clinical Pharmacist 04/06/2023  2:27 PM

## 2023-04-06 NOTE — Progress Notes (Signed)
Primary Care Physician: Kaleen Mask, MD Referring Physician: Dr. Allyson Sabal Primary EP: Dr Nelly Laurence    Julia Rose is a 77 y.o. 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 She was referred by Dr. Allyson Sabal for further evaluation 07/12/15.  The afib was paroxysmal at that time. She had been started on edoxaban 30 mg daily for a chadsvasc score of 4. She was aware of fatigue, shortness of breath and chest heaviness when in afib. 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   Returned to afib clinic 09/2016,  after seeing Dr. Allyson Sabal, and pt feeling she has been in persistent afib since  fall. For the most part she is minimally symptomatic. She walks with her daughter when the weather is nice 30 mins to an hour and can do so with minimal symptoms. She had a repeat ECHO with normal EF and moderate aortic stenosis which she was told was  stable. She was asked to be seen here to discuss antiarrythmic therapy. 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 Continues warfarin for a CHA2DS2VASc score of at least 3. She deferred any change in therapy due to up coming trip.   Returned to afib clinic 10/28/16, and wishes to further discuss restoring SR. When she went on her trip, she noticed how short of breath with activity she had become since the trip last year. People in her group were always waiting for her to catch up with them.She reports that when she makes the bed, she has to rest half way in between. Financial concerns would definitely play into what antiarrythmic she would be able to afford. After discussion of available antiarrythmic's, she was not ready to start AAD but was interested in pursuing cardioversion alone to see if she could return to SR and if her symptoms would improve in SR. She also has moderate aortic stenosis that may be playing a part in he symptoms, but by echo Dr. Allyson Sabal told her this is stable.. She is on warfarin  and has had 3 therapeutic warfarin levels checked  with PCP, 5/2 - 2.4, 5/10 - 2.1, 5/16 - 2.3 and INR pending this am with pre procedure labs. Pt aware of risk vrs benefit of procedure and wants to proceed.  F/u afib clinic 5/30 f/u cardioversion. The cardioversion was successful but unfortunately she did return to afib after 3 days. It was long enough for her to notice that she had more energy and less exertional dyspnea. This has made her more interested in pursing SR using an antiarrythmic which she was against before.  F/u 9/20 from hospitalization for sotalol, fortunately, pt did convert with drug and did not require cardioversion. She does have less shortness of breath in SR and more energy. She has done some outdoor walking and deep cleaning at her house which would not have been possible in the past year in afib. She is happy re results.  F/u in afib clinic, 1/15- She continues in SR and continues to experience less shortness of breath. She does have ongoing fatigue, but improved in SR.Marland Kitchen She has refused to have sleep study in the past.   F/u in afib clinic 09/21/17, at the request of Dr. Allyson Sabal, from finding afib at his recent office visit. Today pt is back in SR. She states that she had more stress lately as her brother had open heart surgery in [place] and she went up to  stay with him. She has had her echo repeated recently and fairly stable with  mod aortic stenosis, diastolic dysfunction.  F/u afib clinic, 5/8, she reports very little breakthrough afib and it is short lived. Happy with sotalol management. Echo for moderate AS is pending 08/2018.  F/u in fib clinic, 11/8. She is in SR and feels well. No issues with meds. Has had to stop walking for arthritic changes in her back and hips. She is interested in the [facility]  [employer] 3 month supervised exercise program.  On follow up today, 12/01/22. She is currently in rate controlled Afib. Seen by Dr. Allyson Sabal on 11/17/22 and noted to be in rate controlled  Afib at that time. She has an Scientist, physiological she wears to monitor her rhythm since the beginning of the year. She did not bring it in today because it was charging. She notes that last week she was in Afib 100% of the time according to the watch; she was in Afib 65% of the time 2 weeks ago. She feels tired. No missed doses of sotalol or coumadin.   On follow up 01/07/23, she is currently in rate controlled atrial flutter. She was scheduled for DCCV on 6/3 but cancelled due to arriving in [place]. Since that day, she has gone in and out of abnormal rhythm. She shows me on her phone Apple watch data over the past few months noting periodically 100% Afib burden. She had 100% burden for the majority of May and right before scheduled cardioversion was back in normal rhythm. Currently, she feels asymptomatic at this time.     Follow up in the AF clinic 04/06/23. Patient presents today for dofetilide admission. She has discontinued citalopram. She remains in rate controlled atypical atrial flutter today with symptoms of fatigue.   Today, she denies symptoms of palpitations, shortness of breath, no chest pain, orthopnea, PND, lower extremity edema, dizziness, presyncope, syncope, or neurologic sequela. The patient is tolerating medications without difficulties and is otherwise without complaint today.   Past Medical History:  Diagnosis Date   Arthritis    "hands" (04/06/2017)   Asthma    mild with exertion   Atrial fibrillation with RVR (HCC)    Chronic back pain    "from the DDD"   DDD (degenerative disc disease), cervical    C-5-6 and C6-7 are partially herniated   DDD (degenerative disc disease), lumbar    L4-5 and L5-6  and S-1 are herniated discs   DDD (degenerative disc disease), thoracic    "not dx'd yet" (04/06/2017)   Hyperlipidemia    statin intolerant   Hypertension    Moderate aortic stenosis    Right bundle branch block    Seizures (HCC)    "when I was younger; none since the 35s"  (04/06/2017)   Type II diabetes mellitus (HCC)     Current Outpatient Medications  Medication Sig Dispense Refill   acetaminophen (TYLENOL 8 HOUR ARTHRITIS PAIN) 650 MG CR tablet Take 650 mg by mouth as needed for pain.     albuterol (PROVENTIL HFA;VENTOLIN HFA) 108 (90 Base) MCG/ACT inhaler Inhale 2 puffs into the lungs every 6 (six) hours as needed for wheezing or shortness of breath.     amLODipine (NORVASC) 2.5 MG tablet TAKE 1 TABLET BY MOUTH DAILY (Patient taking differently: Take 2.5 mg by mouth at bedtime.) 90 tablet 3   atorvastatin (LIPITOR) 20 MG tablet TAKE 1 TABLET BY MOUTH DAILY (Patient taking differently: Take 20 mg by mouth at  bedtime.) 90 tablet 1   ibuprofen (ADVIL) 200 MG tablet Take 400 mg by mouth as needed for moderate pain.     losartan (COZAAR) 100 MG tablet Take 25 mg by mouth at bedtime.  0   Magnesium 250 MG TABS Take 250 mg by mouth in the morning.     metFORMIN (GLUCOPHAGE) 1000 MG tablet Taking 1000mg  in the am and 500mg  in the evening with meals     Nutritional Supplements (JUICE PLUS FIBRE PO) Take 1 capsule by mouth in the morning and at bedtime. Fruit, berry and veg.     oxybutynin (DITROPAN-XL) 5 MG 24 hr tablet Take 5 mg by mouth at bedtime.     PHENObarbital (LUMINAL) 100 MG tablet Take 100 mg by mouth at bedtime.  4   polyvinyl alcohol (LIQUIFILM TEARS) 1.4 % ophthalmic solution Place 1 drop into both eyes as needed for dry eyes.     RYBELSUS 3 MG TABS Take 1 tablet by mouth every morning.     Simethicone 180 MG CAPS Take 180 mg by mouth as needed (for gas).      warfarin (COUMADIN) 5 MG tablet Take 1 tablet (5 mg total) by mouth daily. (Patient taking differently: Taking 7.5mg  on Tuesday, Wednesday, and 10 mg on Thursday) 30 tablet 0   No current facility-administered medications for this encounter.    ROS- All systems are reviewed and negative except as per the HPI above  Physical Exam: Vitals:   04/06/23 0920  BP: (!) 150/84  Pulse: 73  Weight:  81.8 kg  Height: 5' (1.524 m)     GEN: Well nourished, well developed in no acute distress NECK: No JVD; No carotid bruits CARDIAC: Irregularly irregular rate and rhythm, no rubs, gallops, 2/6 systolic murmur  RESPIRATORY:  Clear to auscultation without rales, wheezing or rhonchi  ABDOMEN: Soft, non-tender, non-distended EXTREMITIES:  No edema; No deformity    EKG today demonstrates Atypical atrial flutter with variable block, RBBB Vent. rate 73 BPM PR interval * ms QRS duration 136 ms QT/QTcB 394/434 ms   Echo-09/04/22  1. Left ventricular ejection fraction, by estimation, is 60 to 65%. The  left ventricle has normal function. The left ventricle has no regional  wall motion abnormalities. Left ventricular diastolic parameters were  normal. The average left ventricular  global longitudinal strain is -18.3 %. The global longitudinal strain is  normal.   2. Right ventricular systolic function is moderately reduced. The right  ventricular size is moderately enlarged. There is mildly elevated  pulmonary artery systolic pressure.   3. The mitral valve is abnormal. Mild mitral valve regurgitation. No  evidence of mitral stenosis.   4. Tricuspid valve regurgitation is mild to moderate.   5. The aortic valve is tricuspid. There is moderate calcification of the  aortic valve. There is moderate thickening of the aortic valve. Aortic  valve regurgitation is mild. Moderate aortic valve stenosis.   6. The inferior vena cava is normal in size with greater than 50%  respiratory variability, suggesting right atrial pressure of 3 mmHg.    CHA2DS2-VASc Score = 4  The patient's score is based upon: CHF History: 0 HTN History: 1 Diabetes History: 0 Stroke History: 0 Vascular Disease History: 0 Age Score: 2 Gender Score: 1       ASSESSMENT AND PLAN: Persistent Atrial Fibrillation/atrial flutter The patient's CHA2DS2-VASc score is 4, indicating a 4.8% annual risk of stroke.    Previously failed sotalol. Patient presents for dofetilide admission. Continue  warfarin, she has had 3 weekly therapeutic INRs at PCP office (8/19 3.7, 8/26 2.4, 9/3 2.1). Check INR today. No recent benadryl use PharmD has screened medications, she has discontinued citalopram.  Labs today show creatinine at 0.9, K+ 4.0 and mag 1.8, CrCl calculated at 67 mL/min Plan for ablation in the future, continue weight loss efforts until then.  Secondary Hypercoagulable State (ICD10:  D68.69) The patient is at significant risk for stroke/thromboembolism based upon her CHA2DS2-VASc Score of 4.  Continue Warfarin (Coumadin).   Obesity Body mass index is 35.23 kg/m.  Encouraged lifestyle modification  HTN Elevated today, reevaluate once back in SR.  VHD Moderate AS Followed by Dr Allyson Sabal with serial echos   To be admitted later today once a bed becomes available.    Jorja Loa PA-C [facility] [address] [place], [place] [address] [phone]

## 2023-04-06 NOTE — H&P (Signed)
Electrophysiology H&P  Note    Primary Care Physician: Kaleen Mask, MD Referring Physician: Dr. Allyson Sabal Primary EP: Dr Nelly Laurence    Julia Rose is a 77 y.o. 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 She was referred by Dr. Allyson Sabal for further evaluation 07/12/15.  The afib was paroxysmal at that time. She had been started on edoxaban 30 mg daily for a chadsvasc score of 4. She was aware of fatigue, shortness of breath and chest heaviness when in afib. 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   Returned to afib clinic 09/2016,  after seeing Dr. Allyson Sabal, and pt feeling she has been in persistent afib since  fall. For the most part she is minimally symptomatic. She walks with her daughter when the weather is nice 30 mins to an hour and can do so with minimal symptoms. She had a repeat ECHO with normal EF and moderate aortic stenosis which she was told was  stable. She was asked to be seen here to discuss antiarrythmic therapy. 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 Continues warfarin for a CHA2DS2VASc score of at least 3. She deferred any change in therapy due to up coming trip.   Returned to afib clinic 10/28/16, and wishes to further discuss restoring SR. When she went on her trip, she noticed how short of breath with activity she had become since the trip last year. People in her group were always waiting for her to catch up with them.She reports that when she makes the bed, she has to rest half way in between. Financial concerns would definitely play into what antiarrythmic she would be able to afford. After discussion of available antiarrythmic's, she was not ready to start AAD but was interested in pursuing cardioversion alone to see if she could return to SR and if her symptoms would improve in SR. She also has moderate aortic stenosis that may be playing a part in he symptoms, but by echo Dr. Allyson Sabal told her this  is stable.. She is on warfarin and has had 3 therapeutic warfarin levels checked  with PCP, 5/2 - 2.4, 5/10 - 2.1, 5/16 - 2.3 and INR pending this am with pre procedure labs. Pt aware of risk vrs benefit of procedure and wants to proceed.  F/u afib clinic 5/30 f/u cardioversion. The cardioversion was successful but unfortunately she did return to afib after 3 days. It was long enough for her to notice that she had more energy and less exertional dyspnea. This has made her more interested in pursing SR using an antiarrythmic which she was against before.  F/u 9/20 from hospitalization for sotalol, fortunately, pt did convert with drug and did not require cardioversion. She does have less shortness of breath in SR and more energy. She has done some outdoor walking and deep cleaning at her house which would not have been possible in the past year in afib. She is happy re results.  F/u in afib clinic, 1/15- She continues in SR and continues to experience less shortness of breath. She does have ongoing fatigue, but improved in SR.Marland Kitchen She has refused to have sleep study in the past.   F/u in afib clinic 09/21/17, at the request of Dr. Allyson Sabal, from finding afib at his recent office visit. Today pt is back in SR. She states that she had more stress lately as her brother had open heart surgery in  [place] and she went up to stay with him. She has had her echo repeated recently and fairly stable with  mod aortic stenosis, diastolic dysfunction.  F/u afib clinic, 5/8, she reports very little breakthrough afib and it is short lived. Happy with sotalol management. Echo for moderate AS is pending 08/2018.  F/u in fib clinic, 11/8. She is in SR and feels well. No issues with meds. Has had to stop walking for arthritic changes in her back and hips. She is interested in the [facility]  [employer] 3 month supervised exercise program.  On follow up today, 12/01/22. She is currently in rate controlled Afib. Seen by Dr. Allyson Sabal on 11/17/22 and  noted to be in rate controlled Afib at that time. She has an Scientist, physiological she wears to monitor her rhythm since the beginning of the year. She did not bring it in today because it was charging. She notes that last week she was in Afib 100% of the time according to the watch; she was in Afib 65% of the time 2 weeks ago. She feels tired. No missed doses of sotalol or coumadin.   On follow up 01/07/23, she is currently in rate controlled atrial flutter. She was scheduled for DCCV on 6/3 but cancelled due to arriving in NSR. Since that day, she has gone in and out of abnormal rhythm. She shows me on her phone Apple watch data over the past few months noting periodically 100% Afib burden. She had 100% burden for the majority of May and right before scheduled cardioversion was back in normal rhythm. Currently, she feels asymptomatic at this time.     Follow up in the AF clinic 04/06/23. Patient presents today for dofetilide admission. She has discontinued citalopram. She remains in rate controlled atypical atrial flutter today with symptoms of fatigue.   Today, she denies symptoms of palpitations, shortness of breath, no chest pain, orthopnea, PND, lower extremity edema, dizziness, presyncope, syncope, or neurologic sequela. The patient is tolerating medications without difficulties and is otherwise without complaint today.   Past Medical History:  Diagnosis Date   Arthritis    "hands" (04/06/2017)   Asthma    mild with exertion   Atrial fibrillation with RVR (HCC)    Chronic back pain    "from the DDD"   DDD (degenerative disc disease), cervical    C-5-6 and C6-7 are partially herniated   DDD (degenerative disc disease), lumbar    L4-5 and L5-6  and S-1 are herniated discs   DDD (degenerative disc disease), thoracic    "not dx'd yet" (04/06/2017)   Hyperlipidemia    statin intolerant   Hypertension    Moderate aortic stenosis    Right bundle branch block    Seizures (HCC)    "when I was younger;  none since the 18s" (04/06/2017)   Type II diabetes mellitus (HCC)     Current Facility-Administered Medications  Medication Dose Route Frequency Provider Last Rate Last Admin   0.9 %  sodium chloride infusion  250 mL Intravenous PRN Mealor, Roberts Gaudy, MD       acetaminophen (TYLENOL) tablet 650 mg  650 mg Oral Q6H PRN Fenton, Clint R, PA       albuterol (PROVENTIL) (2.5 MG/3ML) 0.083% nebulizer solution 3 mL  3 mL Inhalation Q6H PRN Fenton, Clint R, PA       amLODipine (NORVASC) tablet 2.5 mg  2.5 mg Oral QHS Fenton, Clint R, PA       atorvastatin (LIPITOR)  tablet 20 mg  20 mg Oral QHS Fenton, Clint R, PA       dofetilide (TIKOSYN) capsule 500 mcg  500 mcg Oral BID Mealor, Roberts Gaudy, MD       losartan (COZAAR) tablet 25 mg  25 mg Oral QHS Fenton, Clint R, PA       [START ON 04/07/2023] magnesium oxide (MAG-OX) tablet 200 mg  200 mg Oral q AM Fenton, Clint R, PA       magnesium sulfate IVPB 4 g 100 mL  4 g Intravenous Once Mealor, Roberts Gaudy, MD       [START ON 04/07/2023] metFORMIN (GLUCOPHAGE) tablet 1,000 mg  1,000 mg Oral Q breakfast Fenton, Clint R, PA       metFORMIN (GLUCOPHAGE) tablet 500 mg  500 mg Oral QPM Fenton, Clint R, PA       oxybutynin (DITROPAN-XL) 24 hr tablet 5 mg  5 mg Oral QHS Fenton, Clint R, PA       polyvinyl alcohol (LIQUIFILM TEARS) 1.4 % ophthalmic solution 1 drop  1 drop Both Eyes PRN Fenton, Clint R, PA       sodium chloride flush (NS) 0.9 % injection 3 mL  3 mL Intravenous Q12H Mealor, Roberts Gaudy, MD       sodium chloride flush (NS) 0.9 % injection 3 mL  3 mL Intravenous PRN Mealor, Roberts Gaudy, MD       [START ON 04/08/2023] warfarin (COUMADIN) tablet 10 mg  10 mg Oral ONCE-1600 Mealor, Roberts Gaudy, MD       warfarin (COUMADIN) tablet 7.5 mg  7.5 mg Oral q1600 Mealor, Roberts Gaudy, MD       Warfarin - Pharmacist Dosing Inpatient   Does not apply [id number] Mealor, Roberts Gaudy, MD        ROS- All systems are reviewed and negative except as per the HPI above  Physical  Exam: There were no vitals filed for this visit.    GEN: Well nourished, well developed in no acute distress NECK: No JVD; No carotid bruits CARDIAC: Irregularly irregular rate and rhythm, no rubs, gallops, 2/6 systolic murmur  RESPIRATORY:  Clear to auscultation without rales, wheezing or rhonchi  ABDOMEN: Soft, non-tender, non-distended EXTREMITIES:  No edema; No deformity    EKG today demonstrates Atypical atrial flutter with variable block, RBBB Vent. rate 73 BPM PR interval * ms QRS duration 136 ms QT/QTcB 394/434 ms   Echo-09/04/22  1. Left ventricular ejection fraction, by estimation, is 60 to 65%. The  left ventricle has normal function. The left ventricle has no regional  wall motion abnormalities. Left ventricular diastolic parameters were  normal. The average left ventricular  global longitudinal strain is -18.3 %. The global longitudinal strain is  normal.   2. Right ventricular systolic function is moderately reduced. The right  ventricular size is moderately enlarged. There is mildly elevated  pulmonary artery systolic pressure.   3. The mitral valve is abnormal. Mild mitral valve regurgitation. No  evidence of mitral stenosis.   4. Tricuspid valve regurgitation is mild to moderate.   5. The aortic valve is tricuspid. There is moderate calcification of the  aortic valve. There is moderate thickening of the aortic valve. Aortic  valve regurgitation is mild. Moderate aortic valve stenosis.   6. The inferior vena cava is normal in size with greater than 50%  respiratory variability, suggesting right atrial pressure of 3 mmHg.    CHA2DS2-VASc Score = 4  The patient's score is based  upon: CHF History: 0 HTN History: 1 Diabetes History: 0 Stroke History: 0 Vascular Disease History: 0 Age Score: 2 Gender Score: 1      ASSESSMENT AND PLAN: Persistent Atrial Fibrillation/atrial flutter The patient's CHA2DS2-VASc score is 4, indicating a 4.8% annual risk of  stroke.   Previously failed sotalol. Patient presents for dofetilide admission. Continue warfarin, she has had 3 weekly therapeutic INRs at PCP office (8/19 3.7, 8/26 2.4, 9/3 2.1). INR 2.4. No recent benadryl use PharmD has screened medications, she has discontinued citalopram.  Labs today show creatinine at 0.9, K+ 4.0 and mag 1.8, CrCl calculated at 67 mL/min Plan for ablation in the future, continue weight loss efforts until then.  Secondary Hypercoagulable State (ICD10:  D68.69) The patient is at significant risk for stroke/thromboembolism based upon her CHA2DS2-VASc Score of 4.  Continue Warfarin (Coumadin).   Obesity There is no height or weight on file to calculate BMI.  Encouraged lifestyle modification  HTN Elevated today, reevaluate once back in SR.  VHD Moderate AS Followed by Dr Allyson Sabal with serial echos  Pt presents for tikosyn as above.   Casimiro Needle [address] [place], PA-C  04/06/2023 2:50 PM

## 2023-04-06 NOTE — TOC CM/SW Note (Signed)
Transition of Care [facility]) - Inpatient Brief Assessment   Patient Details  Name: Julia Rose MRN: [id number] Date of Birth: October 25, 1945  Transition of Care [facility]) CM/SW Contact:    Gala Lewandowsky, RN Phone Number: 04/06/2023, 3:42 PM   Clinical Narrative: Transition of Care Department [facility]) has reviewed the patient. Patient presented for Tikosyn Load. Benefits check submitted for cost $46.66. Case Manager will discuss cost and pharmacy of choice as the patient progresses.    Transition of Care Asessment: Insurance and Status: Insurance coverage has been reviewed Patient has primary care physician: Yes Prior/Current Home Services: No current home services Social Determinants of Health Reivew: SDOH reviewed no interventions necessary Readmission risk has been reviewed: Yes Transition of care needs: no transition of care needs at this time

## 2023-04-07 ENCOUNTER — Telehealth: Payer: Self-pay

## 2023-04-07 DIAGNOSIS — I4819 Other persistent atrial fibrillation: Secondary | ICD-10-CM | POA: Diagnosis not present

## 2023-04-07 DIAGNOSIS — I480 Paroxysmal atrial fibrillation: Secondary | ICD-10-CM

## 2023-04-07 LAB — BASIC METABOLIC PANEL
Anion gap: 8 (ref 5–15)
BUN: 14 mg/dL (ref 8–23)
CO2: 23 mmol/L (ref 22–32)
Calcium: 8.7 mg/dL — ABNORMAL LOW (ref 8.9–10.3)
Chloride: 106 mmol/L (ref 98–111)
Creatinine, Ser: 0.78 mg/dL (ref 0.44–1.00)
GFR, Estimated: >60 mL/min (ref >60)
Glucose, Bld: 113 mg/dL — ABNORMAL HIGH (ref 70–99)
Potassium: 4.9 mmol/L (ref 3.5–5.1)
Sodium: 137 mmol/L (ref 135–145)

## 2023-04-07 LAB — MAGNESIUM: Magnesium: 2.3 mg/dL (ref 1.7–2.4)

## 2023-04-07 LAB — PROTIME-INR
INR: 2.7 — ABNORMAL HIGH (ref 0.8–1.2)
Prothrombin Time: 29.2 s — ABNORMAL HIGH (ref 11.4–15.2)

## 2023-04-07 MED ORDER — PHENOBARBITAL 32.4 MG PO TABS
97.2000 mg | ORAL_TABLET | Freq: Every day | ORAL | Status: DC
  Administered 2023-04-07 – 2023-04-08 (×2): 97.2 mg via ORAL
  Filled 2023-04-07 (×2): qty 3

## 2023-04-07 MED ORDER — SODIUM CHLORIDE 0.9 % IV SOLN: INTRAVENOUS | Status: DC

## 2023-04-07 MED ORDER — SEMAGLUTIDE 3 MG PO TABS
1.0000 | ORAL_TABLET | Freq: Every morning | ORAL | Status: DC
  Administered 2023-04-07 – 2023-04-09 (×3): 3 mg via ORAL
  Filled 2023-04-07 (×5): qty 1

## 2023-04-07 MED ORDER — DOFETILIDE 250 MCG PO CAPS
250.0000 ug | ORAL_CAPSULE | Freq: Two times a day (BID) | ORAL | Status: DC
  Administered 2023-04-07 – 2023-04-09 (×4): 250 ug via ORAL
  Filled 2023-04-07 (×4): qty 1

## 2023-04-07 NOTE — Plan of Care (Signed)

## 2023-04-07 NOTE — Progress Notes (Signed)
Pharmacy: Dofetilide (Tikosyn) - Follow Up Assessment and Electrolyte Replacement  Pharmacy consulted to assist in monitoring and replacing electrolytes in this 77 y.o. female admitted on 04/06/2023 undergoing dofetilide initiation. First dofetilide dose: 04/06/23  Labs:    Component Value Date/Time   K 4.9 04/07/2023 0249   MG 2.3 04/07/2023 0249     Plan: Potassium: K >/= 4: No additional supplementation needed  Magnesium: Mg > 2: No additional supplementation needed - on PTA Mag-Ox 200 mg daily   Thank you for allowing pharmacy to participate in this patient's care   Trixie Rude, PharmD Clinical Pharmacist 04/07/2023  7:12 AM

## 2023-04-07 NOTE — Telephone Encounter (Signed)
Spoke with patient, ablation scheduled for 07/30/23 and appt with Dr Nelly Laurence in office on 07/08/23 for labs. No needs at this time

## 2023-04-07 NOTE — Progress Notes (Signed)
Electrophysiology Rounding Note  Patient Name: Julia Rose Date of Encounter: 04/07/2023  Primary Cardiologist: Nanetta Batty, MD  Electrophysiologist: Maurice Small, MD    Subjective   Pt  remains in AFL with variable block  on Tikosyn 500 mcg BID   QTc from EKG last pm shows stable QTc at  The patient is doing well today.  At this time, the patient denies chest pain, shortness of breath, or any new concerns.  Inpatient Medications    Scheduled Meds:  amLODipine  2.5 mg Oral QHS   atorvastatin  20 mg Oral QHS   dofetilide  500 mcg Oral BID   [START ON 04/09/2023] influenza vaccine adjuvanted  0.5 mL Intramuscular Tomorrow-1000   losartan  25 mg Oral QHS   magnesium oxide  200 mg Oral q AM   metFORMIN  1,000 mg Oral Q breakfast   metFORMIN  500 mg Oral QPM   oxybutynin  5 mg Oral QHS   phenobarbital  97.2 mg Oral QHS   sodium chloride flush  3 mL Intravenous Q12H   [START ON 04/08/2023] warfarin  10 mg Oral ONCE-1600   warfarin  7.5 mg Oral q1600   Warfarin - Pharmacist Dosing Inpatient   Does not apply q1600   Continuous Infusions:  sodium chloride     PRN Meds: sodium chloride, acetaminophen, albuterol, polyvinyl alcohol, sodium chloride flush   Vital Signs    Vitals:   04/06/23 1335 04/06/23 1954 04/06/23 2311 04/07/23 0410  BP: (!) 150/84 134/77 125/76 (!) 114/54  Pulse: 73 79 75 83  Resp: 20 18 20 20   Temp: 99 F (37.2 C) 98 F (36.7 C) 97.8 F (36.6 C) 98.2 F (36.8 C)  TempSrc: Oral Oral Oral Oral  SpO2: 96% 94% 96% 94%  Weight:      Height:        Intake/Output Summary (Last 24 hours) at 04/07/2023 0921 Last data filed at 04/06/2023 2100 Gross per 24 hour  Intake 895.7 ml  Output --  Net 895.7 ml   Filed Weights   04/06/23 1325  Weight: 81.8 kg    Physical Exam    GEN- NAD, A&O x 3. Normal affect.  Lungs- CTAB, Normal effort.  Heart- Regular rate and rhythm. No M/G/R GI- Soft, NT, ND Extremities- No clubbing, cyanosis,  or edema Skin- no rash or lesion  Labs    CBC No results for input(s): "WBC", "NEUTROABS", "HGB", "HCT", "MCV", "PLT" in the last 72 hours. Basic Metabolic Panel Recent Labs    47/82/95 0922 04/07/23 0249  NA 138 137  K 4.0 4.9  CL 103 106  CO2 23 23  GLUCOSE 114* 113*  BUN 16 14  CREATININE 0.90 0.78  CALCIUM 9.2 8.7*  MG 1.8 2.3    Telemetry    Atrial flutter / fib 60-70's (personally reviewed)  Patient Profile     Julia Rose is a 77 y.o. female with a past medical history significant for persistent atrial fibrillation.  They were admitted for tikosyn load.   Assessment & Plan    Persistent Atrial Fibrillation Pt remains in afib on Tikosyn 500 mcg BID  Continue Coumadin Creatinine, ser  0.78 (09/11 0249) Magnesium  2.3 (09/11 0249) Potassium4.9 (09/11 0249) No electrolyte supplementation needed  If pt does not convert chemically, plan on DCCV Friday       For questions or updates, please contact [facility] Please consult [email or web address] for contact info under Cardiology/STEMI.  Signed, Merry Proud  Veleta Miners, MSN, APRN, NP-C, AGACNP-BC [facility] - Electrophysiology  04/07/2023, 9:27 AM

## 2023-04-07 NOTE — Care Management (Signed)
1514 04-07-23 Patient presented for Tikosyn Load. Case Manager spoke with the patient regarding co pay cost. Patient is agreeable to cost and would like to have the initial Rx filled via [facility] and the Rx refills escribed to [facility]. No further needs identified at this time.

## 2023-04-07 NOTE — Progress Notes (Signed)
Morning EKG reviewed     Shows  atrial flutter  at 89 bpm with prolonged QTc at 500 ms.  Dose reduced to Tikosyn 250 mcg BID.   Potassium4.9 (09/11 0249) Magnesium  2.3 (09/11 0249) Creatinine, ser  0.78 (09/11 0249)  Pt will be NPO after midnight for DCCV if remains in a-flutter.    Julia Brim, MSN, APRN, NP-C, AGACNP-BC [facility] - Electrophysiology  04/07/2023, 12:10 PM

## 2023-04-07 NOTE — Progress Notes (Signed)
ANTICOAGULATION CONSULT NOTE - Initial Consult  Pharmacy Consult for warfarin Indication: atrial fibrillation  Allergies  Allergen Reactions   Dilantin [Phenytoin] Hives and Other (See Comments)    Whelps all over for two weeks   Statins Other (See Comments)    High dose statin - dizziness    Patient Measurements: Height: 5' (152.4 cm) Weight: 81.8 kg (180 lb 6.4 oz) IBW/kg (Calculated) : 45.5  Vital Signs: Temp: 98.2 F (36.8 C) (09/11 0410) Temp Source: Oral (09/11 0410) BP: 114/54 (09/11 0410) Pulse Rate: 83 (09/11 0410)  Labs: Recent Labs    04/06/23 0922 04/06/23 1022 04/07/23 0249  LABPROT  --  26.3* 29.2*  INR  --  2.4* 2.7*  CREATININE 0.90  --  0.78    Estimated Creatinine Clearance: 55.8 mL/min (by C-G formula based on SCr of 0.78 mg/dL).  Medical History: Past Medical History:  Diagnosis Date   Arthritis    "hands" (04/06/2017)   Asthma    mild with exertion   Atrial fibrillation with RVR (HCC)    Chronic back pain    "from the DDD"   DDD (degenerative disc disease), cervical    C-5-6 and C6-7 are partially herniated   DDD (degenerative disc disease), lumbar    L4-5 and L5-6  and S-1 are herniated discs   DDD (degenerative disc disease), thoracic    "not dx'd yet" (04/06/2017)   Hyperlipidemia    statin intolerant   Hypertension    Moderate aortic stenosis    Right bundle branch block    Seizures (HCC)    "when I was younger; none since the 24s" (04/06/2017)   Type II diabetes mellitus (HCC)      Assessment: 77 yo female presents for Tikosyn initiation.  Anticoagulated with warfarin PTA - 7.5 mg Tu, Wed then 10 mg Thurs (repeats every 3 days).  INR 2.7 is therapeutic.    Goal of Therapy:  INR 2-3 Monitor platelets by anticoagulation protocol: Yes   Plan:  Continue home regimen - 7.5 mg Tu/Wed, 10 mg Thurs Daily INR Monitor for s/sx of bleeding  Trixie Rude, PharmD Clinical Pharmacist 04/07/2023  7:12 AM

## 2023-04-08 ENCOUNTER — Encounter (HOSPITAL_COMMUNITY)
Admission: AD | Disposition: A | Discharge status: Home / Self Care | Payer: Self-pay | Source: Ambulatory Visit | Attending: Cardiovascular Disease

## 2023-04-08 DIAGNOSIS — I4819 Other persistent atrial fibrillation: Secondary | ICD-10-CM | POA: Diagnosis not present

## 2023-04-08 LAB — PROTIME-INR
INR: 2.9 — ABNORMAL HIGH (ref 0.8–1.2)
Prothrombin Time: 30.9 s — ABNORMAL HIGH (ref 11.4–15.2)

## 2023-04-08 LAB — BASIC METABOLIC PANEL
Anion gap: 8 (ref 5–15)
BUN: 13 mg/dL (ref 8–23)
CO2: 25 mmol/L (ref 22–32)
Calcium: 9.2 mg/dL (ref 8.9–10.3)
Chloride: 106 mmol/L (ref 98–111)
Creatinine, Ser: 0.79 mg/dL (ref 0.44–1.00)
GFR, Estimated: >60 mL/min (ref >60)
Glucose, Bld: 127 mg/dL — ABNORMAL HIGH (ref 70–99)
Potassium: 4 mmol/L (ref 3.5–5.1)
Sodium: 139 mmol/L (ref 135–145)

## 2023-04-08 LAB — MAGNESIUM: Magnesium: 1.8 mg/dL (ref 1.7–2.4)

## 2023-04-08 SURGERY — CARDIOVERSION
Anesthesia: General

## 2023-04-08 MED ORDER — OFF THE BEAT BOOK
Freq: Once | Status: DC
  Filled 2023-04-08: qty 1

## 2023-04-08 MED ORDER — MAGNESIUM SULFATE 2 GM/50ML IV SOLN
2.0000 g | Freq: Once | INTRAVENOUS | Status: AC
  Administered 2023-04-08: 2 g via INTRAVENOUS
  Filled 2023-04-08: qty 50

## 2023-04-08 MED ORDER — WARFARIN SODIUM 7.5 MG PO TABS
7.5000 mg | ORAL_TABLET | Freq: Every day | ORAL | Status: AC
  Administered 2023-04-08: 7.5 mg via ORAL
  Filled 2023-04-08: qty 1

## 2023-04-08 NOTE — Progress Notes (Signed)
  Patient Name: Julia Rose Date of Encounter: 04/08/2023  Primary Cardiologist: Nanetta Batty, MD Electrophysiologist: Maurice Small, MD  Interval Summary   Pt denies acute issues  Converted to SR overnight   Vital Signs    Vitals:   04/07/23 0410 04/07/23 1137 04/07/23 2011 04/08/23 0404  BP: (!) 114/54 126/68 (!) 153/67 (!) 107/57  Pulse: 83 79 85 76  Resp: 20 14 16 18   Temp: 98.2 F (36.8 C) 98.8 F (37.1 C) 98 F (36.7 C) 98.1 F (36.7 C)  TempSrc: Oral Oral Oral Oral  SpO2: 94% 96% 95% 96%  Weight:      Height:        Intake/Output Summary (Last 24 hours) at 04/08/2023 0853 Last data filed at 04/08/2023 0849 Gross per 24 hour  Intake 183 ml  Output --  Net 183 ml   Filed Weights   04/06/23 1325  Weight: 81.8 kg    Physical Exam    GEN- The patient is well appearing, alert and oriented x 3 today.   Lungs- Clear to ausculation bilaterally, normal work of breathing Cardiac- Regular rate and rhythm, no murmurs, rubs or gallops GI- soft, NT, ND, + BS Extremities- no clubbing or cyanosis. No edema  Telemetry    Converted to SR 80's (personally reviewed)  EKG post evening dose > QTc 480-486ms manually corrected   Hospital Course    Julia Rose is a 77 y.o. female with a past medical history significant for persistent atrial fibrillation.  They were admitted for tikosyn load.   Assessment & Plan    Persistent Atrial Fibrillation  -Continue Tikosyn 250 mcg BID  -K+ 4, Mg+ 1.8 > magnesium supplementation of 2gm IV this am  -tele monitoring   High Risk Medication  -admit for Tikosyn load   Secondary Hypercoagulable State CHA2DS2-VASc 4  -continue warfarin home regimen 7.5mg  Tu/W, 10mg  Th -daily INR     For questions or updates, please contact [facility] Please consult [email or web address] for contact info under Cardiology/STEMI.  Signed, Canary Brim, MSN, APRN, NP-C, AGACNP-BC [facility] - Electrophysiology  04/08/2023,  8:53 AM

## 2023-04-08 NOTE — Progress Notes (Signed)
  Progress Note   Date: 04/08/2023  Patient Name: Julia Rose        MRN#: [id number]   The medication of metformin was given for treatment of the diagnosis/condition of diabetes.

## 2023-04-08 NOTE — Progress Notes (Signed)
Morning EKG reviewed     Shows remains in NSR with borderline QTc at ~480-490 ms when measured manually  Continue  Tikosyn 250 mcg BID for now  Potassium4.0 (09/12 0535) Magnesium  1.8 (09/12 0535) Creatinine, ser  0.79 (09/12 0535)  Plan for home Friday if QTc remains stable   Graciella Freer, [place]  04/08/2023 1:59 PM

## 2023-04-08 NOTE — Progress Notes (Signed)
ANTICOAGULATION CONSULT NOTE - Consult  Pharmacy Consult for warfarin Indication: atrial fibrillation  Allergies  Allergen Reactions   Dilantin [Phenytoin] Hives and Other (See Comments)    Whelps all over for two weeks   Statins Other (See Comments)    High dose statin - dizziness    Patient Measurements: Height: 5' (152.4 cm) Weight: 81.8 kg (180 lb 6.4 oz) IBW/kg (Calculated) : 45.5  Vital Signs: Temp: 98.1 F (36.7 C) (09/12 0404) Temp Source: Oral (09/12 0404) BP: 107/57 (09/12 0404) Pulse Rate: 76 (09/12 0404)  Labs: Recent Labs    04/06/23 0922 04/06/23 1022 04/07/23 0249 04/08/23 0535  LABPROT  --  26.3* 29.2* 30.9*  INR  --  2.4* 2.7* 2.9*  CREATININE 0.90  --  0.78 0.79    Estimated Creatinine Clearance: 55.8 mL/min (by C-G formula based on SCr of 0.79 mg/dL).  Medical History: Past Medical History:  Diagnosis Date   Arthritis    "hands" (04/06/2017)   Asthma    mild with exertion   Atrial fibrillation with RVR (HCC)    Chronic back pain    "from the DDD"   DDD (degenerative disc disease), cervical    C-5-6 and C6-7 are partially herniated   DDD (degenerative disc disease), lumbar    L4-5 and L5-6  and S-1 are herniated discs   DDD (degenerative disc disease), thoracic    "not dx'd yet" (04/06/2017)   Hyperlipidemia    statin intolerant   Hypertension    Moderate aortic stenosis    Right bundle branch block    Seizures (HCC)    "when I was younger; none since the 24s" (04/06/2017)   Type II diabetes mellitus (HCC)     Assessment: 77 yo female presents for Tikosyn initiation.  Anticoagulated with warfarin PTA - 7.5 mg Tu, Wed then 10 mg Thurs (repeats every 3 days).  INR 2.9 is therapeutic and trending up.    Goal of Therapy:  INR 2-3 Monitor platelets by anticoagulation protocol: Yes   Plan:  Warfarin 7.5 mg x1 today (reduced from PTA regimen given uptrend in INR) Daily INR Monitor for s/sx of bleeding  Trixie Rude,  PharmD Clinical Pharmacist 04/08/2023  7:06 AM

## 2023-04-08 NOTE — Progress Notes (Signed)
   04/08/23 1115  Spiritual Encounters  Type of Visit Initial  Care provided to: Patient  Referral source Nurse (RN/NT/LPN)  Reason for visit Advance directives  OnCall Visit No  Spiritual Framework  Presenting Themes Values and beliefs;Impactful experiences and emotions;Courage hope and growth  Community/Connection Other (comment)  Interventions  Spiritual Care Interventions Made Reflective listening;Narrative/life review  Intervention Outcomes  Outcomes Connection to spiritual care;Other (comment)   Responded to spiritual consult to complete advance directive. Provided education. Patient not ready to complete process at this time. Patient is being discharged tomorrow. Patient will have chaplain paged after she reads paperwork if she decides to have it taken care of before she leaves, if not she will return document at follow up visit if she wants to pursue.   Also engaged in reflective conversation and future travel desires.

## 2023-04-08 NOTE — Progress Notes (Signed)
Pharmacy: Dofetilide (Tikosyn) - Follow Up Assessment and Electrolyte Replacement  Pharmacy consulted to assist in monitoring and replacing electrolytes in this 77 y.o. female admitted on 04/06/2023 undergoing dofetilide initiation. First dofetilide dose: 04/06/23  Labs:    Component Value Date/Time   K 4.0 04/08/2023 0535   MG 1.8 04/08/2023 0535     Plan: Potassium: K >/= 4: No additional supplementation needed  Magnesium: Mg 1.8-2: Give Mg 2 gm IV x1  - on PTA Mag-Ox 200 mg daily   Thank you for allowing pharmacy to participate in this patient's care   Trixie Rude, PharmD Clinical Pharmacist 04/08/2023  7:05 AM

## 2023-04-08 NOTE — Plan of Care (Signed)

## 2023-04-09 ENCOUNTER — Other Ambulatory Visit (HOSPITAL_COMMUNITY): Payer: Self-pay

## 2023-04-09 DIAGNOSIS — I4819 Other persistent atrial fibrillation: Secondary | ICD-10-CM | POA: Diagnosis not present

## 2023-04-09 LAB — BASIC METABOLIC PANEL
Anion gap: 12 (ref 5–15)
BUN: 11 mg/dL (ref 8–23)
CO2: 23 mmol/L (ref 22–32)
Calcium: 9.6 mg/dL (ref 8.9–10.3)
Chloride: 104 mmol/L (ref 98–111)
Creatinine, Ser: 0.7 mg/dL (ref 0.44–1.00)
GFR, Estimated: >60 mL/min (ref >60)
Glucose, Bld: 124 mg/dL — ABNORMAL HIGH (ref 70–99)
Potassium: 4.2 mmol/L (ref 3.5–5.1)
Sodium: 139 mmol/L (ref 135–145)

## 2023-04-09 LAB — MAGNESIUM: Magnesium: 1.8 mg/dL (ref 1.7–2.4)

## 2023-04-09 LAB — PROTIME-INR
INR: 2.3 — ABNORMAL HIGH (ref 0.8–1.2)
Prothrombin Time: 25.2 s — ABNORMAL HIGH (ref 11.4–15.2)

## 2023-04-09 MED ORDER — MAGNESIUM OXIDE -MG SUPPLEMENT 400 (240 MG) MG PO TABS: 400.0000 mg | ORAL_TABLET | Freq: Every morning | ORAL | Status: DC

## 2023-04-09 MED ORDER — MAGNESIUM OXIDE -MG SUPPLEMENT 400 (240 MG) MG PO TABS
400.0000 mg | ORAL_TABLET | Freq: Every morning | ORAL | 6 refills | Status: DC
  Filled 2023-04-09: qty 120, 120d supply, fill #0

## 2023-04-09 MED ORDER — MAGNESIUM SULFATE 2 GM/50ML IV SOLN: 2.0000 g | Freq: Once | INTRAVENOUS | Status: DC

## 2023-04-09 MED ORDER — DOFETILIDE 250 MCG PO CAPS
250.0000 ug | ORAL_CAPSULE | Freq: Two times a day (BID) | ORAL | 6 refills | Status: DC
  Filled 2023-04-09: qty 60, 30d supply, fill #0

## 2023-04-09 MED ORDER — MAGNESIUM SULFATE 2 GM/50ML IV SOLN
2.0000 g | Freq: Once | INTRAVENOUS | Status: DC
  Filled 2023-04-09: qty 50

## 2023-04-09 MED ORDER — DOFETILIDE 250 MCG PO CAPS: 250.0000 ug | ORAL_CAPSULE | Freq: Two times a day (BID) | ORAL | 3 refills | Status: DC

## 2023-04-09 MED ORDER — WARFARIN SODIUM 5 MG PO TABS: 5.0000 mg | ORAL_TABLET | Freq: Every day | ORAL | Status: DC

## 2023-04-09 MED ORDER — WARFARIN SODIUM 7.5 MG PO TABS: 7.5000 mg | ORAL_TABLET | Freq: Every day | ORAL | Status: DC

## 2023-04-09 NOTE — TOC Transition Note (Signed)
Transition of Care [facility]) - CM/SW Discharge Note   Patient Details  Name: Julia Rose MRN: [id number] Date of Birth: 1946/06/19  Transition of Care [facility]) CM/SW Contact:  Tom-Johnson, Hershal Coria, RN Phone Number: 04/09/2023, 1:09 PM   Clinical Narrative:     Patient is scheduled for discharge today.  Readmission Risk Assessment done. Outpatient referral, hospital f/u and discharge instructions on AVS. Prescriptions sent to [facility] pharmacy and meds will be delivered to patient at bedside prior discharge and Tykosin sent to [facility] for patient to pickup. Daughter, Cristela Blue to transport at discharge.  No further TOC needs noted.       Final next level of care: Home/Self Care Barriers to Discharge: Barriers Resolved   Patient Goals and CMS Choice CMS Medicare.gov Compare Post Acute Care list provided to:: Patient Choice offered to / list presented to : NA  Discharge Placement                  Patient to be transferred to facility by: Daughter Name of family member notified: Melissa    Discharge Plan and Services Additional resources added to the After Visit Summary for                  DME Arranged: N/A DME Agency: NA       HH Arranged: NA HH Agency: NA        Social Determinants of Health (SDOH) Interventions SDOH Screenings   Food Insecurity: No Food Insecurity (04/06/2023)  Housing: Low Risk  (04/06/2023)  Transportation Needs: No Transportation Needs (04/06/2023)  Utilities: Not At Risk (04/06/2023)  Tobacco Use: Low Risk  (04/06/2023)     Readmission Risk Interventions    04/09/2023    1:08 PM  Readmission Risk Prevention Plan  Post Dischage Appt Complete  Medication Screening Complete  Transportation Screening Complete

## 2023-04-09 NOTE — Care Management Important Message (Signed)
Important Message  Patient Details  Name: Julia Rose MRN: [id number] Date of Birth: 04/10/1946   Medicare Important Message Given:  Yes     Sherilyn Banker 04/09/2023, 1:32 PM

## 2023-04-09 NOTE — Progress Notes (Signed)
ANTICOAGULATION CONSULT NOTE - Consult  Pharmacy Consult for warfarin Indication: atrial fibrillation  Allergies  Allergen Reactions   Dilantin [Phenytoin] Hives and Other (See Comments)    Whelps all over for two weeks   Statins Other (See Comments)    High dose statin - dizziness    Patient Measurements: Height: 5' (152.4 cm) Weight: 81.8 kg (180 lb 6.4 oz) IBW/kg (Calculated) : 45.5  Vital Signs: Temp: 98 F (36.7 C) (09/13 0722) Temp Source: Oral (09/13 0722) BP: 131/83 (09/13 0722)  Labs: Recent Labs    04/07/23 0249 04/08/23 0535 04/09/23 0735  LABPROT 29.2* 30.9* 25.2*  INR 2.7* 2.9* 2.3*  CREATININE 0.78 0.79 0.70    Estimated Creatinine Clearance: 55.8 mL/min (by C-G formula based on SCr of 0.7 mg/dL).  Medical History: Past Medical History:  Diagnosis Date   Arthritis    "hands" (04/06/2017)   Asthma    mild with exertion   Atrial fibrillation with RVR (HCC)    Chronic back pain    "from the DDD"   DDD (degenerative disc disease), cervical    C-5-6 and C6-7 are partially herniated   DDD (degenerative disc disease), lumbar    L4-5 and L5-6  and S-1 are herniated discs   DDD (degenerative disc disease), thoracic    "not dx'd yet" (04/06/2017)   Hyperlipidemia    statin intolerant   Hypertension    Moderate aortic stenosis    Right bundle branch block    Seizures (HCC)    "when I was younger; none since the 29s" (04/06/2017)   Type II diabetes mellitus (HCC)     Assessment: 77 yo female presents for Tikosyn initiation.  Anticoagulated with warfarin PTA - 7.5 mg Tu, Wed then 10 mg Thurs (repeats every 3 days).  INR 2.3 is therapeutic.     Goal of Therapy:  INR 2-3 Monitor platelets by anticoagulation protocol: Yes   Plan:  Warfarin 7.5 mg x1 if doesn't discharge today Daily INR Monitor for s/sx of bleeding  Trixie Rude, PharmD Clinical Pharmacist 04/09/2023  11:48 AM

## 2023-04-09 NOTE — Discharge Summary (Signed)
ELECTROPHYSIOLOGY DISCHARGE SUMMARY    Patient ID: Julia Rose,  MRN: [id number], DOB/AGE: 1945-12-18 77 y.o.  Admit date: 04/06/2023 Discharge date: 04/09/2023  Primary Care Physician: Kaleen Mask, MD  Primary Cardiologist: Nanetta Batty, MD  Electrophysiologist: Dr. Nelly Laurence   Primary Discharge Diagnosis:  1.  Persistent atrial fibrillation status post Tikosyn loading this admission  Secondary Discharge Diagnosis:  High Risk Medication Load - Tikosyn  Secondary Hypercoagulable State   Allergies  Allergen Reactions   Dilantin [Phenytoin] Hives and Other (See Comments)    Whelps all over for two weeks   Statins Other (See Comments)    High dose statin - dizziness     Procedures This Admission:  1.  Tikosyn loading   Brief HPI: Julia Rose is a 77 y.o. female with a past medical history as noted above.  They were referred to EP for treatment options of atrial fibrillation.  Risks, benefits, and alternatives to Tikosyn were reviewed with the patient who wished to proceed with admission for loading.  Hospital Course:  The patient was admitted and Tikosyn was initiated.  Renal function and electrolytes were followed during the hospitalization. The patient converted chemically and did not require cardioversion. The patients QT prolonged, requiring dose reduction to 250 mcg BID at . They were monitored on telemetry up to discharge. On the day of discharge, they were examined by Dr. Nelly Laurence  who considered them stable for discharge to home.  Follow-up has been arranged with the Atrial Fibrillation clinic in approximately 1 week.   Physical Exam: Vitals:   04/08/23 0404 04/08/23 2059 04/08/23 2100 04/09/23 0722  BP: (!) 107/57 128/76 128/76 131/83  Pulse: 76     Resp: 18   17  Temp: 98.1 F (36.7 C)  98.1 F (36.7 C) 98 F (36.7 C)  TempSrc: Oral  Oral Oral  SpO2: 96%     Weight:      Height:        GEN- pleasant adult female, sitting on side of  bed, NAD, A&O x 3. Normal affect.  Lungs- CTAB, Normal effort.  Heart- Regular rate and rhythm. No M/G/R GI- Soft, NT, ND Extremities- No clubbing, cyanosis, or edema Skin- no rash or lesion  Labs:   Lab Results  Component Value Date   WBC 8.1 12/28/2022   HGB 14.5 12/28/2022   HCT 43.7 12/28/2022   MCV 92.8 12/28/2022   PLT 239 12/28/2022    Recent Labs  Lab 04/09/23 0735  NA 139  K 4.2  CL 104  CO2 23  BUN 11  CREATININE 0.70  CALCIUM 9.6  GLUCOSE 124*    Discharge Medications:  Allergies as of 04/09/2023       Reactions   Dilantin [phenytoin] Hives, Other (See Comments)   Whelps all over for two weeks   Statins Other (See Comments)   High dose statin - dizziness        Medication List     STOP taking these medications    Magnesium 250 MG Tabs Replaced by: magnesium oxide 400 (240 Mg) MG tablet       TAKE these medications    albuterol 108 (90 Base) MCG/ACT inhaler Commonly known as: VENTOLIN HFA Inhale 2 puffs into the lungs every 6 (six) hours as needed for wheezing or shortness of breath.   amLODipine 2.5 MG tablet Commonly known as: NORVASC TAKE 1 TABLET BY MOUTH DAILY What changed: when to take this   atorvastatin 20  MG tablet Commonly known as: LIPITOR TAKE 1 TABLET BY MOUTH DAILY What changed: when to take this   dofetilide 250 MCG capsule Commonly known as: TIKOSYN Take 1 capsule (250 mcg total) by mouth 2 (two) times daily.   ibuprofen 200 MG tablet Commonly known as: ADVIL Take 400 mg by mouth as needed for moderate pain.   JUICE PLUS FIBRE PO Take 1 capsule by mouth in the morning and at bedtime. Fruit, berry and veg.   losartan 100 MG tablet Commonly known as: COZAAR Take 25 mg by mouth at bedtime.   magnesium oxide 400 (240 Mg) MG tablet Commonly known as: MAG-OX Take 1 tablet (400 mg total) by mouth in the morning. Start taking on: April 10, 2023 Replaces: Magnesium 250 MG Tabs   metFORMIN 1000 MG  tablet Commonly known as: GLUCOPHAGE Taking 1000mg  in the am and 500mg  in the evening with meals   oxybutynin 5 MG 24 hr tablet Commonly known as: DITROPAN-XL Take 5 mg by mouth at bedtime.   PHENObarbital 100 MG tablet Commonly known as: LUMINAL Take 100 mg by mouth at bedtime.   polyvinyl alcohol 1.4 % ophthalmic solution Commonly known as: LIQUIFILM TEARS Place 1 drop into both eyes as needed for dry eyes.   Rybelsus 3 MG Tabs Generic drug: Semaglutide Take 1 tablet by mouth every morning.   Simethicone 180 MG Caps Take 180 mg by mouth as needed (for gas).   Tylenol 8 Hour Arthritis Pain 650 MG CR tablet Generic drug: acetaminophen Take 650 mg by mouth as needed for pain.   warfarin 5 MG tablet Commonly known as: COUMADIN Take 1 tablet (5 mg total) by mouth daily. Taking 7.5mg  on Tuesday, Wednesday, and 10 mg on Thursday        Disposition: Home.  Discharge Instructions     Call MD for:  difficulty breathing, headache or visual disturbances   Complete by: As directed    Call MD for:  extreme fatigue   Complete by: As directed    Call MD for:  persistant dizziness or light-headedness   Complete by: As directed    Call MD for:  persistant nausea and vomiting   Complete by: As directed    Diet - low sodium heart healthy   Complete by: As directed    Diet Carb Modified   Complete by: As directed    Increase activity slowly   Complete by: As directed         Duration of Discharge Encounter: Greater than 30 minutes including physician time.  Signed, Canary Brim, MSN, APRN, NP-C, AGACNP-BC [facility] - Electrophysiology  04/09/2023, 12:07 PM

## 2023-04-09 NOTE — Progress Notes (Signed)
Pharmacy: Dofetilide (Tikosyn) - Follow Up Assessment and Electrolyte Replacement  Pharmacy consulted to assist in monitoring and replacing electrolytes in this 77 y.o. female admitted on 04/06/2023 undergoing dofetilide initiation. First dofetilide dose: 04/06/23  Labs:    Component Value Date/Time   K 4.2 04/09/2023 0735   MG 1.8 04/09/2023 0735     Plan: Potassium: K >/= 4: No additional supplementation needed  Magnesium: Mg 1.8-2: Give Mg 2 gm IV x1  - PTA Mag-Ox 200 mg daily increased to 400 mg daily today   Thank you for allowing pharmacy to participate in this patient's care   Trixie Rude, PharmD Clinical Pharmacist 04/09/2023  11:45 AM

## 2023-04-09 NOTE — Progress Notes (Signed)
EKG from yesterday evening 04/08/2023 reviewed     Shows remains in NSR with borderline QTc at ~480 ms when measured manually and corrected for QRS  Continue  Tikosyn 250 mcg BID.   Potassium4.0 (09/12 0535) Magnesium  1.8 (09/12 0535) - Supp Creatinine, ser  0.79 (09/12 0535)  Plan for home this afternoon if QTc remains stable.   Graciella Freer, PA-C  04/09/2023 7:18 AM

## 2023-04-16 ENCOUNTER — Ambulatory Visit (HOSPITAL_COMMUNITY)
Admit: 2023-04-16 | Discharge: 2023-04-16 | Disposition: A | Discharge status: Home / Self Care | Payer: Medicare Other | Source: Ambulatory Visit | Attending: Internal Medicine | Admitting: Internal Medicine

## 2023-04-16 VITALS — BP 132/84 | HR 101 | Ht 60.0 in | Wt 179.6 lb

## 2023-04-16 DIAGNOSIS — Z7901 Long term (current) use of anticoagulants: Secondary | ICD-10-CM | POA: Diagnosis not present

## 2023-04-16 DIAGNOSIS — I4819 Other persistent atrial fibrillation: Secondary | ICD-10-CM | POA: Diagnosis present

## 2023-04-16 DIAGNOSIS — E669 Obesity, unspecified: Secondary | ICD-10-CM | POA: Insufficient documentation

## 2023-04-16 DIAGNOSIS — R9431 Abnormal electrocardiogram [ECG] [EKG]: Secondary | ICD-10-CM | POA: Diagnosis not present

## 2023-04-16 DIAGNOSIS — I10 Essential (primary) hypertension: Secondary | ICD-10-CM | POA: Diagnosis not present

## 2023-04-16 DIAGNOSIS — Z5181 Encounter for therapeutic drug level monitoring: Secondary | ICD-10-CM | POA: Insufficient documentation

## 2023-04-16 DIAGNOSIS — I350 Nonrheumatic aortic (valve) stenosis: Secondary | ICD-10-CM | POA: Diagnosis not present

## 2023-04-16 DIAGNOSIS — I480 Paroxysmal atrial fibrillation: Secondary | ICD-10-CM

## 2023-04-16 DIAGNOSIS — Z79899 Other long term (current) drug therapy: Secondary | ICD-10-CM | POA: Insufficient documentation

## 2023-04-16 DIAGNOSIS — I4892 Unspecified atrial flutter: Secondary | ICD-10-CM | POA: Insufficient documentation

## 2023-04-16 DIAGNOSIS — D6869 Other thrombophilia: Secondary | ICD-10-CM | POA: Diagnosis not present

## 2023-04-16 DIAGNOSIS — Z6835 Body mass index (BMI) 35.0-35.9, adult: Secondary | ICD-10-CM | POA: Diagnosis not present

## 2023-04-16 LAB — MAGNESIUM: Magnesium: 1.7 mg/dL (ref 1.7–2.4)

## 2023-04-16 LAB — BASIC METABOLIC PANEL
Anion gap: 10 (ref 5–15)
BUN: 15 mg/dL (ref 8–23)
CO2: 24 mmol/L (ref 22–32)
Calcium: 9.2 mg/dL (ref 8.9–10.3)
Chloride: 105 mmol/L (ref 98–111)
Creatinine, Ser: 0.92 mg/dL (ref 0.44–1.00)
GFR, Estimated: >60 mL/min (ref >60)
Glucose, Bld: 135 mg/dL — ABNORMAL HIGH (ref 70–99)
Potassium: 4.3 mmol/L (ref 3.5–5.1)
Sodium: 139 mmol/L (ref 135–145)

## 2023-04-16 MED ORDER — DOFETILIDE 250 MCG PO CAPS: 250.0000 ug | ORAL_CAPSULE | Freq: Two times a day (BID) | ORAL | 3 refills | Status: DC

## 2023-04-16 MED ORDER — MAGNESIUM OXIDE -MG SUPPLEMENT 400 (240 MG) MG PO TABS: 400.0000 mg | ORAL_TABLET | Freq: Every morning | ORAL | 6 refills | Status: DC

## 2023-04-16 NOTE — Addendum Note (Signed)
Encounter addended by: Shona Simpson, RN on: 04/16/2023 11:19 AM  Actions taken: Pharmacy for encounter modified, Order list changed

## 2023-04-16 NOTE — Progress Notes (Addendum)
Primary Care Physician: Kaleen Mask, MD Referring Physician: Dr. Allyson Sabal Primary EP: Dr Nelly Laurence    Julia Rose is a 77 y.o. 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 She was referred by Dr. Allyson Sabal for further evaluation 07/12/15.  The afib was paroxysmal at that time. She had been started on edoxaban 30 mg daily for a chadsvasc score of 4. She was aware of fatigue, shortness of breath and chest heaviness when in afib. 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   Returned to afib clinic 09/2016,  after seeing Dr. Allyson Sabal, and pt feeling she has been in persistent afib since  fall. For the most part she is minimally symptomatic. She walks with her daughter when the weather is nice 30 mins to an hour and can do so with minimal symptoms. She had a repeat ECHO with normal EF and moderate aortic stenosis which she was told was  stable. She was asked to be seen here to discuss antiarrythmic therapy. 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 Continues warfarin for a CHA2DS2VASc score of at least 3. She deferred any change in therapy due to up coming trip.   Returned to afib clinic 10/28/16, and wishes to further discuss restoring SR. When she went on her trip, she noticed how short of breath with activity she had become since the trip last year. People in her group were always waiting for her to catch up with them.She reports that when she makes the bed, she has to rest half way in between. Financial concerns would definitely play into what antiarrythmic she would be able to afford. After discussion of available antiarrythmic's, she was not ready to start AAD but was interested in pursuing cardioversion alone to see if she could return to SR and if her symptoms would improve in SR. She also has moderate aortic stenosis that may be playing a part in he symptoms, but by echo Dr. Allyson Sabal told her this is stable.. She is on warfarin  and has had 3 therapeutic warfarin levels checked  with PCP, 5/2 - 2.4, 5/10 - 2.1, 5/16 - 2.3 and INR pending this am with pre procedure labs. Pt aware of risk vrs benefit of procedure and wants to proceed.  F/u afib clinic 5/30 f/u cardioversion. The cardioversion was successful but unfortunately she did return to afib after 3 days. It was long enough for her to notice that she had more energy and less exertional dyspnea. This has made her more interested in pursing SR using an antiarrythmic which she was against before.  F/u 9/20 from hospitalization for sotalol, fortunately, pt did convert with drug and did not require cardioversion. She does have less shortness of breath in SR and more energy. She has done some outdoor walking and deep cleaning at her house which would not have been possible in the past year in afib. She is happy re results.  F/u in afib clinic, 1/15- She continues in SR and continues to experience less shortness of breath. She does have ongoing fatigue, but improved in SR.Marland Kitchen She has refused to have sleep study in the past.   F/u in afib clinic 09/21/17, at the request of Dr. Allyson Sabal, from finding afib at his recent office visit. Today pt is back in SR. She states that she had more stress lately as her brother had open heart surgery in [place] and she went up to  stay with him. She has had her echo repeated recently and fairly stable with  mod aortic stenosis, diastolic dysfunction.  F/u afib clinic, 5/8, she reports very little breakthrough afib and it is short lived. Happy with sotalol management. Echo for moderate AS is pending 08/2018.  F/u in fib clinic, 11/8. She is in SR and feels well. No issues with meds. Has had to stop walking for arthritic changes in her back and hips. She is interested in the [facility]  [employer] 3 month supervised exercise program.  On follow up today, 12/01/22. She is currently in rate controlled Afib. Seen by Dr. Allyson Sabal on 11/17/22 and noted to be in rate controlled  Afib at that time. She has an Scientist, physiological she wears to monitor her rhythm since the beginning of the year. She did not bring it in today because it was charging. She notes that last week she was in Afib 100% of the time according to the watch; she was in Afib 65% of the time 2 weeks ago. She feels tired. No missed doses of sotalol or coumadin.   On follow up 01/07/23, she is currently in rate controlled atrial flutter. She was scheduled for DCCV on 6/3 but cancelled due to arriving in NSR. Since that day, she has gone in and out of abnormal rhythm. She shows me on her phone Apple watch data over the past few months noting periodically 100% Afib burden. She had 100% burden for the majority of May and right before scheduled cardioversion was back in normal rhythm. Currently, she feels asymptomatic at this time.     Follow up in the AF clinic 04/06/23. Patient presents today for dofetilide admission. She has discontinued citalopram. She remains in rate controlled atypical atrial flutter today with symptoms of fatigue.   F/u in Afib clinic, 04/16/23. Patient is currently in atrial flutter. S/p Tikosyn admission 9/10-13. She converted chemically to NSR and did not require cardioversion. Patient's QT interval prolonged and dose reduction was indicated. She is currently on Tikosyn 250 mcg BID. No missed doses of Tikosyn. She notes since being home she is still paroxysmal per symptoms and Apple watch about 50% of the time. She shows me HR 60-70s at home. She is trying to increase her exercise and walking about 3/4 of a mile daily. No bleeding issues on coumadin. Patient tells me she has been taking her previous OTC magnesium pill which is 250 mg. She has not picked up new prescription of magnesium 400 mg daily.   Today, she denies symptoms of palpitations, shortness of breath, no chest pain, orthopnea, PND, lower extremity edema, dizziness, presyncope, syncope, or neurologic sequela. The patient is tolerating  medications without difficulties and is otherwise without complaint today.   Past Medical History:  Diagnosis Date   Arthritis    "hands" (04/06/2017)   Asthma    mild with exertion   Atrial fibrillation with RVR (HCC)    Chronic back pain    "from the DDD"   DDD (degenerative disc disease), cervical    C-5-6 and C6-7 are partially herniated   DDD (degenerative disc disease), lumbar    L4-5 and L5-6  and S-1 are herniated discs   DDD (degenerative disc disease), thoracic    "not dx'd yet" (04/06/2017)   Hyperlipidemia    statin intolerant   Hypertension    Moderate aortic stenosis    Right bundle branch block    Seizures (HCC)    "when I was younger; none since the  1980s" (04/06/2017)   Type II diabetes mellitus (HCC)     Current Outpatient Medications  Medication Sig Dispense Refill   acetaminophen (TYLENOL 8 HOUR ARTHRITIS PAIN) 650 MG CR tablet Take 650 mg by mouth as needed for pain.     albuterol (PROVENTIL HFA;VENTOLIN HFA) 108 (90 Base) MCG/ACT inhaler Inhale 2 puffs into the lungs every 6 (six) hours as needed for wheezing or shortness of breath.     amLODipine (NORVASC) 2.5 MG tablet TAKE 1 TABLET BY MOUTH DAILY (Patient taking differently: Take 2.5 mg by mouth at bedtime.) 90 tablet 3   atorvastatin (LIPITOR) 20 MG tablet TAKE 1 TABLET BY MOUTH DAILY (Patient taking differently: Take 20 mg by mouth at bedtime.) 90 tablet 1   ibuprofen (ADVIL) 200 MG tablet Take 400 mg by mouth as needed for moderate pain.     lidocaine (LMX) 4 % cream Apply 1 Application topically as needed.     losartan (COZAAR) 100 MG tablet Take 25 mg by mouth at bedtime.  0   MAGNESIUM OXIDE PO Take 250 mg by mouth daily.     metFORMIN (GLUCOPHAGE) 1000 MG tablet Taking 1000mg  in the am and 500mg  in the evening with meals     Nutritional Supplements (JUICE PLUS FIBRE PO) Take 3 capsules by mouth in the morning and at bedtime. Fruit, berry and veg.     oxybutynin (DITROPAN-XL) 5 MG 24 hr tablet Take 5  mg by mouth at bedtime.     PHENObarbital (LUMINAL) 100 MG tablet Take 100 mg by mouth at bedtime.  4   polyvinyl alcohol (LIQUIFILM TEARS) 1.4 % ophthalmic solution Place 1 drop into both eyes as needed for dry eyes.     RYBELSUS 3 MG TABS Take 1 tablet by mouth every morning.     Simethicone 180 MG CAPS Take 180 mg by mouth as needed (for gas).      warfarin (COUMADIN) 5 MG tablet Take 1 tablet (5 mg total) by mouth daily. Taking 7.5mg  on Tuesday, Wednesday, and 10 mg on Thursday     [START ON 05/09/2023] dofetilide (TIKOSYN) 250 MCG capsule Take 1 capsule (250 mcg total) by mouth 2 (two) times daily. 30 capsule 3   magnesium oxide (MAG-OX) 400 (240 Mg) MG tablet Take 1 tablet (400 mg total) by mouth in the morning. (Patient not taking: Reported on 04/16/2023) 120 tablet 6   No current facility-administered medications for this encounter.    ROS- All systems are reviewed and negative except as per the HPI above  Physical Exam: Vitals:   04/16/23 0911  BP: 132/84  Pulse: (!) 101  Weight: 81.5 kg  Height: 5' (1.524 m)    GEN- The patient is well appearing, alert and oriented x 3 today.   Neck - no JVD or carotid bruit noted Lungs- Clear to ausculation bilaterally, normal work of breathing Heart- Irregular rate and rhythm, no murmurs, rubs or gallops, PMI not laterally displaced Extremities- no clubbing, cyanosis, or edema Skin - no rash or ecchymosis noted  EKG today demonstrates Vent. rate 101 BPM PR interval * ms QRS duration 134 ms QT/QTcB 410/531 ms P-R-T axes * -33 46 Atrial flutter with variable A-V block Left axis deviation Right bundle branch block Abnormal ECG When compared with ECG of 09-Apr-2023 10:58, PREVIOUS ECG IS PRESENT   Echo-09/04/22  1. Left ventricular ejection fraction, by estimation, is 60 to 65%. The  left ventricle has normal function. The left ventricle has no regional  wall  motion abnormalities. Left ventricular diastolic parameters were   normal. The average left ventricular  global longitudinal strain is -18.3 %. The global longitudinal strain is  normal.   2. Right ventricular systolic function is moderately reduced. The right  ventricular size is moderately enlarged. There is mildly elevated  pulmonary artery systolic pressure.   3. The mitral valve is abnormal. Mild mitral valve regurgitation. No  evidence of mitral stenosis.   4. Tricuspid valve regurgitation is mild to moderate.   5. The aortic valve is tricuspid. There is moderate calcification of the  aortic valve. There is moderate thickening of the aortic valve. Aortic  valve regurgitation is mild. Moderate aortic valve stenosis.   6. The inferior vena cava is normal in size with greater than 50%  respiratory variability, suggesting right atrial pressure of 3 mmHg.    CHA2DS2-VASc Score = 4  The patient's score is based upon: CHF History: 0 HTN History: 1 Diabetes History: 0 Stroke History: 0 Vascular Disease History: 0 Age Score: 2 Gender Score: 1      ASSESSMENT AND PLAN: Persistent Atrial Fibrillation/atrial flutter The patient's CHA2DS2-VASc score is 4, indicating a 4.8% annual risk of stroke.   Previously failed sotalol. S/p Tikosyn admission 9/10-13/24.  Patient is currently in atrial flutter. She notes to be paroxysmal; will continue current regimen and monitor.  Qtc stable. Continue Tikosyn 250 mcg BID. Bmet and mag drawn today. Advised to please stop mag 250 and start new prescription of mag 400 mg daily.   Plan for ablation in the future, continue weight loss efforts until then.  Secondary Hypercoagulable State (ICD10:  D68.69) The patient is at significant risk for stroke/thromboembolism based upon her CHA2DS2-VASc Score of 4.  Continue Warfarin (Coumadin).  Continue coumadin as directed.  Obesity Body mass index is 35.08 kg/m.  Encouraged lifestyle modification  HTN Elevated today, reevaluate once back in SR.  VHD Moderate  AS Followed by Dr Allyson Sabal with serial echos   F/u 1 month for Tikosyn surveillance.    Justin Mend, PA-C [facility] [address] [place], [place] [address] [phone]

## 2023-04-16 NOTE — Addendum Note (Signed)
Encounter addended by: Eustace Pen, PA-C on: 04/16/2023 11:18 AM  Actions taken: Clinical Note Signed, Result note filed

## 2023-04-28 ENCOUNTER — Other Ambulatory Visit (HOSPITAL_COMMUNITY): Payer: Self-pay

## 2023-05-14 ENCOUNTER — Ambulatory Visit (HOSPITAL_COMMUNITY)
Admission: RE | Admit: 2023-05-14 | Discharge: 2023-05-14 | Disposition: A | Discharge status: Home / Self Care | Payer: Medicare Other | Source: Ambulatory Visit | Attending: Internal Medicine | Admitting: Internal Medicine

## 2023-05-14 ENCOUNTER — Other Ambulatory Visit (HOSPITAL_COMMUNITY): Payer: Self-pay | Admitting: *Deleted

## 2023-05-14 ENCOUNTER — Encounter (HOSPITAL_COMMUNITY): Payer: Self-pay | Admitting: Internal Medicine

## 2023-05-14 VITALS — BP 138/98 | HR 72 | Ht 60.0 in | Wt 182.6 lb

## 2023-05-14 DIAGNOSIS — I4510 Unspecified right bundle-branch block: Secondary | ICD-10-CM | POA: Diagnosis not present

## 2023-05-14 DIAGNOSIS — I351 Nonrheumatic aortic (valve) insufficiency: Secondary | ICD-10-CM | POA: Insufficient documentation

## 2023-05-14 DIAGNOSIS — I4819 Other persistent atrial fibrillation: Secondary | ICD-10-CM | POA: Insufficient documentation

## 2023-05-14 DIAGNOSIS — Z79899 Other long term (current) drug therapy: Secondary | ICD-10-CM | POA: Insufficient documentation

## 2023-05-14 DIAGNOSIS — Z7901 Long term (current) use of anticoagulants: Secondary | ICD-10-CM | POA: Diagnosis not present

## 2023-05-14 DIAGNOSIS — Z5181 Encounter for therapeutic drug level monitoring: Secondary | ICD-10-CM | POA: Diagnosis not present

## 2023-05-14 DIAGNOSIS — I480 Paroxysmal atrial fibrillation: Secondary | ICD-10-CM | POA: Diagnosis present

## 2023-05-14 DIAGNOSIS — I4892 Unspecified atrial flutter: Secondary | ICD-10-CM | POA: Diagnosis not present

## 2023-05-14 DIAGNOSIS — D6869 Other thrombophilia: Secondary | ICD-10-CM | POA: Diagnosis not present

## 2023-05-14 DIAGNOSIS — I10 Essential (primary) hypertension: Secondary | ICD-10-CM | POA: Insufficient documentation

## 2023-05-14 LAB — BASIC METABOLIC PANEL
Anion gap: 15 (ref 5–15)
BUN: 8 mg/dL (ref 8–23)
CO2: 24 mmol/L (ref 22–32)
Calcium: 9.7 mg/dL (ref 8.9–10.3)
Chloride: 102 mmol/L (ref 98–111)
Creatinine, Ser: 0.72 mg/dL (ref 0.44–1.00)
GFR, Estimated: >60 mL/min (ref >60)
Glucose, Bld: 124 mg/dL — ABNORMAL HIGH (ref 70–99)
Potassium: 3.7 mmol/L (ref 3.5–5.1)
Sodium: 141 mmol/L (ref 135–145)

## 2023-05-14 LAB — MAGNESIUM: Magnesium: 1.9 mg/dL (ref 1.7–2.4)

## 2023-05-14 MED ORDER — POTASSIUM CHLORIDE CRYS ER 20 MEQ PO TBCR: 20.0000 meq | EXTENDED_RELEASE_TABLET | Freq: Every day | ORAL | 6 refills | Status: DC

## 2023-05-14 NOTE — Progress Notes (Signed)
Primary Care Physician: Kaleen Mask, MD Referring Physician: Dr. Allyson Sabal Primary EP: Dr Nelly Laurence    Julia Rose is a 77 y.o. 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 She was referred by Dr. Allyson Sabal for further evaluation 07/12/15.  The afib was paroxysmal at that time. She had been started on edoxaban 30 mg daily for a chadsvasc score of 4. She was aware of fatigue, shortness of breath and chest heaviness when in afib. 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   Returned to afib clinic 09/2016,  after seeing Dr. Allyson Sabal, and pt feeling she has been in persistent afib since  fall. For the most part she is minimally symptomatic. She walks with her daughter when the weather is nice 30 mins to an hour and can do so with minimal symptoms. She had a repeat ECHO with normal EF and moderate aortic stenosis which she was told was  stable. She was asked to be seen here to discuss antiarrythmic therapy. 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 Continues warfarin for a CHA2DS2VASc score of at least 3. She deferred any change in therapy due to up coming trip.   Returned to afib clinic 10/28/16, and wishes to further discuss restoring SR. When she went on her trip, she noticed how short of breath with activity she had become since the trip last year. People in her group were always waiting for her to catch up with them.She reports that when she makes the bed, she has to rest half way in between. Financial concerns would definitely play into what antiarrythmic she would be able to afford. After discussion of available antiarrythmic's, she was not ready to start AAD but was interested in pursuing cardioversion alone to see if she could return to SR and if her symptoms would improve in SR. She also has moderate aortic stenosis that may be playing a part in he symptoms, but by echo Dr. Allyson Sabal told her this is stable.. She is on warfarin  and has had 3 therapeutic warfarin levels checked  with PCP, 5/2 - 2.4, 5/10 - 2.1, 5/16 - 2.3 and INR pending this am with pre procedure labs. Pt aware of risk vrs benefit of procedure and wants to proceed.  F/u afib clinic 5/30 f/u cardioversion. The cardioversion was successful but unfortunately she did return to afib after 3 days. It was long enough for her to notice that she had more energy and less exertional dyspnea. This has made her more interested in pursing SR using an antiarrythmic which she was against before.  F/u 9/20 from hospitalization for sotalol, fortunately, pt did convert with drug and did not require cardioversion. She does have less shortness of breath in SR and more energy. She has done some outdoor walking and deep cleaning at her house which would not have been possible in the past year in afib. She is happy re results.  F/u in afib clinic, 1/15- She continues in SR and continues to experience less shortness of breath. She does have ongoing fatigue, but improved in SR.Marland Kitchen She has refused to have sleep study in the past.   F/u in afib clinic 09/21/17, at the request of Dr. Allyson Sabal, from finding afib at his recent office visit. Today pt is back in SR. She states that she had more stress lately as her brother had open heart surgery in [place] and she went up to  stay with him. She has had her echo repeated recently and fairly stable with  mod aortic stenosis, diastolic dysfunction.  F/u afib clinic, 5/8, she reports very little breakthrough afib and it is short lived. Happy with sotalol management. Echo for moderate AS is pending 08/2018.  F/u in fib clinic, 11/8. She is in SR and feels well. No issues with meds. Has had to stop walking for arthritic changes in her back and hips. She is interested in the [facility]  [employer] 3 month supervised exercise program.  On follow up today, 12/01/22. She is currently in rate controlled Afib. Seen by Dr. Allyson Sabal on 11/17/22 and noted to be in rate controlled  Afib at that time. She has an Scientist, physiological she wears to monitor her rhythm since the beginning of the year. She did not bring it in today because it was charging. She notes that last week she was in Afib 100% of the time according to the watch; she was in Afib 65% of the time 2 weeks ago. She feels tired. No missed doses of sotalol or coumadin.   On follow up 01/07/23, she is currently in rate controlled atrial flutter. She was scheduled for DCCV on 6/3 but cancelled due to arriving in NSR. Since that day, she has gone in and out of abnormal rhythm. She shows me on her phone Apple watch data over the past few months noting periodically 100% Afib burden. She had 100% burden for the majority of May and right before scheduled cardioversion was back in normal rhythm. Currently, she feels asymptomatic at this time.     Follow up in the AF clinic 04/06/23. Patient presents today for dofetilide admission. She has discontinued citalopram. She remains in rate controlled atypical atrial flutter today with symptoms of fatigue.   F/u in Afib clinic, 04/16/23. Patient is currently in atrial flutter. S/p Tikosyn admission 9/10-13. She converted chemically to NSR and did not require cardioversion. Patient's QT interval prolonged and dose reduction was indicated. She is currently on Tikosyn 250 mcg BID. No missed doses of Tikosyn. She notes since being home she is still paroxysmal per symptoms and Apple watch about 50% of the time. She shows me HR 60-70s at home. She is trying to increase her exercise and walking about 3/4 of a mile daily. No bleeding issues on coumadin. Patient tells me she has been taking her previous OTC magnesium pill which is 250 mg. She has not picked up new prescription of magnesium 400 mg daily.   F/u in Afib clinic, 05/14/23. Patient is currently in NSR. No missed doses of Tikosyn 250 mcg BID. She shows me on her phone data of past month showing half of the time she was in Afib and half of the time she  was in normal rhythm. She admits to not being very active and when in Afib it makes her tired more than usual.   Today, she denies symptoms of palpitations, shortness of breath, no chest pain, orthopnea, PND, lower extremity edema, dizziness, presyncope, syncope, or neurologic sequela. The patient is tolerating medications without difficulties and is otherwise without complaint today.   Past Medical History:  Diagnosis Date   Arthritis    "hands" (04/06/2017)   Asthma    mild with exertion   Atrial fibrillation with RVR (HCC)    Chronic back pain    "from the DDD"   DDD (degenerative disc disease), cervical    C-5-6 and C6-7 are partially herniated   DDD (degenerative disc  disease), lumbar    L4-5 and L5-6  and S-1 are herniated discs   DDD (degenerative disc disease), thoracic    "not dx'd yet" (04/06/2017)   Hyperlipidemia    statin intolerant   Hypertension    Moderate aortic stenosis    Right bundle branch block    Seizures (HCC)    "when I was younger; none since the 60s" (04/06/2017)   Type II diabetes mellitus (HCC)     Current Outpatient Medications  Medication Sig Dispense Refill   acetaminophen (TYLENOL 8 HOUR ARTHRITIS PAIN) 650 MG CR tablet Take 650 mg by mouth as needed for pain.     albuterol (PROVENTIL HFA;VENTOLIN HFA) 108 (90 Base) MCG/ACT inhaler Inhale 2 puffs into the lungs every 6 (six) hours as needed for wheezing or shortness of breath.     amLODipine (NORVASC) 2.5 MG tablet TAKE 1 TABLET BY MOUTH DAILY 90 tablet 3   atorvastatin (LIPITOR) 20 MG tablet TAKE 1 TABLET BY MOUTH DAILY 90 tablet 1   dofetilide (TIKOSYN) 250 MCG capsule Take 1 capsule (250 mcg total) by mouth 2 (two) times daily. 30 capsule 3   ibuprofen (ADVIL) 200 MG tablet Take 400 mg by mouth as needed for moderate pain.     lidocaine (LMX) 4 % cream Apply 1 Application topically as needed.     losartan (COZAAR) 25 MG tablet Take 25 mg by mouth daily.     magnesium oxide (MAG-OX) 400 (240  Mg) MG tablet Take 1 tablet (400 mg total) by mouth in the morning. 30 tablet 6   metFORMIN (GLUCOPHAGE) 1000 MG tablet Taking 1000mg  in the am and 500mg  in the evening with meals     Nutritional Supplements (JUICE PLUS FIBRE PO) Take 3 capsules by mouth in the morning and at bedtime. Fruit, berry and veg.     oxybutynin (DITROPAN-XL) 5 MG 24 hr tablet Take 5 mg by mouth at bedtime.     PHENObarbital (LUMINAL) 100 MG tablet Take 100 mg by mouth at bedtime.  4   polyvinyl alcohol (LIQUIFILM TEARS) 1.4 % ophthalmic solution Place 1 drop into both eyes as needed for dry eyes.     RYBELSUS 3 MG TABS Take 1 tablet by mouth every morning.     Simethicone 180 MG CAPS Take 180 mg by mouth as needed (for gas).      warfarin (COUMADIN) 5 MG tablet Take 1 tablet (5 mg total) by mouth daily. Taking 7.5mg  on Tuesday, Wednesday, and 10 mg on Thursday     No current facility-administered medications for this encounter.    ROS- All systems are reviewed and negative except as per the HPI above  Physical Exam: Vitals:   05/14/23 0958  BP: (!) 138/98  Pulse: 72  Weight: 82.8 kg  Height: 5' (1.524 m)    GEN- The patient is well appearing, alert and oriented x 3 today.   Neck - no JVD or carotid bruit noted Lungs- Clear to ausculation bilaterally, normal work of breathing Heart- Regular rate and rhythm, no murmurs, rubs or gallops, PMI not laterally displaced Extremities- no clubbing, cyanosis, or edema Skin - no rash or ecchymosis noted   EKG today demonstrates Vent. rate 72 BPM PR interval 150 ms QRS duration 130 ms QT/QTcB 444/486 ms P-R-T axes 29 80 20 Normal sinus rhythm Right bundle branch block Abnormal ECG When compared with ECG of 16-Apr-2023 09:23, PREVIOUS ECG IS PRESENT  Echo-09/04/22  1. Left ventricular ejection fraction, by estimation, is  60 to 65%. The  left ventricle has normal function. The left ventricle has no regional  wall motion abnormalities. Left ventricular diastolic  parameters were  normal. The average left ventricular  global longitudinal strain is -18.3 %. The global longitudinal strain is  normal.   2. Right ventricular systolic function is moderately reduced. The right  ventricular size is moderately enlarged. There is mildly elevated  pulmonary artery systolic pressure.   3. The mitral valve is abnormal. Mild mitral valve regurgitation. No  evidence of mitral stenosis.   4. Tricuspid valve regurgitation is mild to moderate.   5. The aortic valve is tricuspid. There is moderate calcification of the  aortic valve. There is moderate thickening of the aortic valve. Aortic  valve regurgitation is mild. Moderate aortic valve stenosis.   6. The inferior vena cava is normal in size with greater than 50%  respiratory variability, suggesting right atrial pressure of 3 mmHg.    CHA2DS2-VASc Score = 4  The patient's score is based upon: CHF History: 0 HTN History: 1 Diabetes History: 0 Stroke History: 0 Vascular Disease History: 0 Age Score: 2 Gender Score: 1      ASSESSMENT AND PLAN: Persistent Atrial Fibrillation/atrial flutter The patient's CHA2DS2-VASc score is 4, indicating a 4.8% annual risk of stroke.   Previously failed sotalol. S/p Tikosyn admission 9/10-13/24.  Patient is currently in NSR. She notes to be paroxysmal with data on phone showing 1/2 time in rhythm and other half out of rhythm. I discussed the possibility of using amiodarone up until her ablation, and went over potential adverse effects of amiodarone that are usually associated with long term use. After discussion, patient wishes to remain on Tikosyn and hopes with the combination of ablation and Tikosyn she can maintain normal rhythm longterm. This is reasonable and we will continue Tikosyn 250 mcg BID.   Qtc stable. Manual interpretation 438 ms.  Continue Tikosyn 250 mcg BID. Bmet and mag drawn today.   Ablation scheduled 07/30/2023 with Dr. Nelly Laurence.   Secondary  Hypercoagulable State (ICD10:  D68.69) The patient is at significant risk for stroke/thromboembolism based upon her CHA2DS2-VASc Score of 4.  Continue Warfarin (Coumadin).  Continue coumadin as directed.  Obesity Body mass index is 35.66 kg/m.  Encouraged lifestyle modification  HTN Stable today, continue to monitor at home.   VHD Moderate AS Followed by Dr Allyson Sabal with serial echos   F/u 3 months for Tikosyn surveillance with Dr. Nelly Laurence (pre ablation appointment).    Justin Mend, PA-C Afib Clinic [facility] [address] [place], [place] [address] [phone]

## 2023-06-10 ENCOUNTER — Other Ambulatory Visit (HOSPITAL_COMMUNITY): Payer: Self-pay | Admitting: *Deleted

## 2023-06-10 MED ORDER — POTASSIUM CHLORIDE CRYS ER 10 MEQ PO TBCR: 20.0000 meq | EXTENDED_RELEASE_TABLET | Freq: Every day | ORAL | 6 refills | Status: DC

## 2023-06-16 ENCOUNTER — Encounter: Payer: Self-pay | Admitting: Cardiovascular Disease

## 2023-06-16 ENCOUNTER — Ambulatory Visit: Payer: Medicare Other | Attending: Cardiovascular Disease | Admitting: Cardiovascular Disease

## 2023-06-16 VITALS — BP 138/74 | HR 81 | Ht 64.0 in | Wt 177.8 lb

## 2023-06-16 DIAGNOSIS — I350 Nonrheumatic aortic (valve) stenosis: Secondary | ICD-10-CM

## 2023-06-16 DIAGNOSIS — I4510 Unspecified right bundle-branch block: Secondary | ICD-10-CM | POA: Diagnosis not present

## 2023-06-16 DIAGNOSIS — I10 Essential (primary) hypertension: Secondary | ICD-10-CM

## 2023-06-16 DIAGNOSIS — E785 Hyperlipidemia, unspecified: Secondary | ICD-10-CM | POA: Diagnosis not present

## 2023-06-16 DIAGNOSIS — I480 Paroxysmal atrial fibrillation: Secondary | ICD-10-CM | POA: Diagnosis not present

## 2023-06-16 NOTE — Patient Instructions (Signed)
Medication Instructions:  Your physician recommends that you continue on your current medications as directed. Please refer to the Current Medication list given to you today.  *If you need a refill on your cardiac medications before your next appointment, please call your pharmacy*   Testing/Procedures: Dr. Allyson Sabal has ordered a CT coronary calcium score.   Test locations:  [facility]  [facility]  This is $99 out of pocket.   Coronary CalciumScan A coronary calcium scan is an imaging test used to look for deposits of calcium and other fatty materials (plaques) in the inner lining of the blood vessels of the heart (coronary arteries). These deposits of calcium and plaques can partly clog and narrow the coronary arteries without producing any symptoms or warning signs. This puts a person at risk for a heart attack. This test can detect these deposits before symptoms develop. Tell a health care provider about: Any allergies you have. All medicines you are taking, including vitamins, herbs, eye drops, creams, and over-the-counter medicines. Any problems you or family members have had with anesthetic medicines. Any blood disorders you have. Any surgeries you have had. Any medical conditions you have. Whether you are pregnant or may be pregnant. What are the risks? Generally, this is a safe procedure. However, problems may occur, including: Harm to a pregnant woman and her unborn baby. This test involves the use of radiation. Radiation exposure can be dangerous to a pregnant woman and her unborn baby. If you are pregnant, you generally should not have this procedure done. Slight increase in the risk of cancer. This is because of the radiation involved in the test. What happens before the procedure? No preparation is needed for this procedure. What happens during the procedure? You will undress and  remove any jewelry around your neck or chest. You will put on a hospital gown. Sticky electrodes will be placed on your chest. The electrodes will be connected to an electrocardiogram (ECG) machine to record a tracing of the electrical activity of your heart. A CT scanner will take pictures of your heart. During this time, you will be asked to lie still and hold your breath for 2-3 seconds while a picture of your heart is being taken. The procedure may vary among health care providers and hospitals. What happens after the procedure? You can get dressed. You can return to your normal activities. It is up to you to get the results of your test. Ask your health care provider, or the department that is doing the test, when your results will be ready. Summary A coronary calcium scan is an imaging test used to look for deposits of calcium and other fatty materials (plaques) in the inner lining of the blood vessels of the heart (coronary arteries). Generally, this is a safe procedure. Tell your health care provider if you are pregnant or may be pregnant. No preparation is needed for this procedure. A CT scanner will take pictures of your heart. You can return to your normal activities after the scan is done. This information is not intended to replace advice given to you by your health care provider. Make sure you discuss any questions you have with your health care provider. Document Released: 01/09/2008 Document Revised: 06/01/2016 Document Reviewed: 06/01/2016 Elsevier Interactive Patient Education  2017 [employer].    Follow-Up: At [facility], you and your health needs are our priority.  As part of our continuing  mission to provide you with exceptional heart care, we have created designated Provider Care Teams.  These Care Teams include your primary Cardiologist (physician) and Advanced Practice Providers (APPs -  Physician Assistants and Nurse Practitioners) who all work together to  provide you with the care you need, when you need it.  We recommend signing up for the patient portal called "MyChart".  Sign up information is provided on this After Visit Summary.  MyChart is used to connect with patients for Virtual Visits (Telemedicine).  Patients are able to view lab/test results, encounter notes, upcoming appointments, etc.  Non-urgent messages can be sent to your provider as well.   To learn more about what you can do with MyChart, go to [email or web address].    Your next appointment:   6 month(s)  Provider:   Nanetta Batty, MD     Other Instructions How to Prepare for Your Cardiac PET/CT Stress Test:  1. Please do not take these medications before your test:   Medications that may interfere with the cardiac pharmacological stress agent (ex. nitrates - including erectile dysfunction medications, isosorbide mononitrate- [please start to hold this medication the day before the test], tamulosin or beta-blockers) the day of the exam. (Erectile dysfunction medication should be held for at least 72 hrs prior to test) Theophylline containing medications for 12 hours. Dipyridamole 48 hours prior to the test. Your remaining medications may be taken with water.  2. Nothing to eat or drink, except water, 3 hours prior to arrival time.   NO caffeine/decaffeinated products, or chocolate 12 hours prior to arrival.  3. NO perfume, cologne or lotion on chest or abdomen area.          - FEMALES - Please avoid wearing dresses to this appointment.  4. Total time is 1 to 2 hours; you may want to bring reading material for the waiting time.  5. Please report to Radiology at the [facility] Main Entrance 30 minutes early for your test.  [address] [place], [place] [address]   Diabetic Preparation:  Hold oral medications. **Hold Metformin** You may take NPH and Lantus insulin. Do not take Humalog or Humulin R (Regular Insulin) the day of your test. Check  blood sugars prior to leaving the house. If able to eat breakfast prior to 3 hour fasting, you may take all medications, including your insulin, Do not worry if you miss your breakfast dose of insulin - start at your next meal. Patients who wear a continuous glucose monitor MUST remove the device prior to scanning.  IF YOU THINK YOU MAY BE PREGNANT, OR ARE NURSING PLEASE INFORM THE TECHNOLOGIST.  In preparation for your appointment, medication and supplies will be purchased.  Appointment availability is limited, so if you need to cancel or reschedule, please call the Radiology Department at [phone] [facility]) OR [phone] [facility])  24 hours in advance to avoid a cancellation fee of $100.00  What to Expect After you Arrive:  Once you arrive and check in for your appointment, you will be taken to a preparation room within the Radiology Department.  A technologist or Nurse will obtain your medical history, verify that you are correctly prepped for the exam, and explain the procedure.  Afterwards,  an IV will be started in your arm and electrodes will be placed on your skin for EKG monitoring during the stress portion of the exam. Then you will be escorted to the PET/CT scanner.  There, staff will get you positioned  on the scanner and obtain a blood pressure and EKG.  During the exam, you will continue to be connected to the EKG and blood pressure machines.  A small, safe amount of a radioactive tracer will be injected in your IV to obtain a series of pictures of your heart along with an injection of a stress agent.    After your Exam:  It is recommended that you eat a meal and drink a caffeinated beverage to counter act any effects of the stress agent.  Drink plenty of fluids for the remainder of the day and urinate frequently for the first couple of hours after the exam.  Your doctor will inform you of your test results within 7-10 business days.  For more information and frequently asked  questions, please visit our website : [email or web address]  For questions about your test or how to prepare for your test, please call: Cardiac Imaging Nurse Navigators Office: [phone]

## 2023-06-16 NOTE — Assessment & Plan Note (Signed)
History of PAF maintaining sinus rhythm on Tikosyn and warfarin oral anticoagulation.  She sees Dr. Nelly Laurence.  Apparently a A-fib ablation is scheduled for 07/30/2023.

## 2023-06-16 NOTE — Assessment & Plan Note (Signed)
History of essential hypertension with blood pressure measured today at 138/74.  She is on amlodipine, and losartan.

## 2023-06-16 NOTE — Assessment & Plan Note (Signed)
History of dyslipidemia on statin therapy with lipid profile performed 10/22/2021 revealing a total cholesterol 182, LDL 101 and HDL 46.  I am going to get a coronary calcium score to her stratify.

## 2023-06-16 NOTE — Progress Notes (Signed)
06/16/2023 MIAA VANDIVIER   1946-03-05  [id number]  Primary Physician Kaleen Mask, MD Primary Cardiologist: Runell Gess MD Nicholes Calamity, [place]  HPI:  Julia Rose is a 77 y.o.  moderately overweight married Caucasian female mother of 4 children, grandmother of a grandchildren whose husband [name] is also a patient of mine. She was referred by Dr. Jeannetta Nap for evaluation of recently recognized atrial fibrillation. I last saw her in the office 11/17/2022.  Her cardiac risk factor profile is notable for treated hypertension, diabetes. She has hyperlipidemia intolerant to statin drugs. She's never had a heart stroke. She occasionally gets chest tightness. 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 She was recently found to be in A. Fib during a routine doctor's visit and was referred here for further evaluation. 8. Event monitor showed A. Fib with heart rates as high as 170. 2-D echo revealed normal LV systolic function with mild to moderate aortic stenosis and a Myoview stress test was low risk and nonischemic. Carotid Doppler showed no evidence of ICA stenosis. She was placed on oral anticoagulation. Today she is in atrial fibrillation with a controlled ventricular response on Coumadin anticoagulation. She underwent inpatient sotalol loading in the fall of last year and saw Rudi Coco and the A. fib clinic last month at which time she was in sinus bradycardia. She is back in A. fib. I was unaware of this. Recent 2-D echocardiogram performed 08/31/17 revealed progression of her aortic stenosis with a valve area of 1.08 cm and a peak gradient of 36 mmHg. She did tell me that recently one of her brothers died of ischemic heart disease post valve replacement and bypass grafting and another brother had bypass grafting as well.   Since I saw her in the office 7 months ago she was hospitalized in September for Tikosyn load and converted to sinus rhythm.  She is  followed by Dr. Nelly Laurence.  She is on warfarin oral anticoagulation.  She scheduled for A-fib ablation 08/16/2023.  She also has moderate aortic stenosis with echo performed 09/03/2022 revealing normal LV systolic function with an aortic valve area of 0.77 cm with a peak gradient of 32 mmHg.  She does complain of effort angina especially walking uphill.  She has no history of ischemic heart disease.   Current Meds  Medication Sig   acetaminophen (TYLENOL 8 HOUR ARTHRITIS PAIN) 650 MG CR tablet Take 650 mg by mouth as needed for pain.   albuterol (PROVENTIL HFA;VENTOLIN HFA) 108 (90 Base) MCG/ACT inhaler Inhale 2 puffs into the lungs every 6 (six) hours as needed for wheezing or shortness of breath.   amLODipine (NORVASC) 2.5 MG tablet TAKE 1 TABLET BY MOUTH DAILY   atorvastatin (LIPITOR) 20 MG tablet TAKE 1 TABLET BY MOUTH DAILY   dofetilide (TIKOSYN) 250 MCG capsule Take 1 capsule (250 mcg total) by mouth 2 (two) times daily.   ibuprofen (ADVIL) 200 MG tablet Take 400 mg by mouth as needed for moderate pain.   lidocaine (LMX) 4 % cream Apply 1 Application topically as needed.   losartan (COZAAR) 25 MG tablet Take 25 mg by mouth daily.   magnesium oxide (MAG-OX) 400 (240 Mg) MG tablet Take 1 tablet (400 mg total) by mouth in the morning.   metFORMIN (GLUCOPHAGE) 1000 MG tablet Taking 1000mg  in the am and 500mg  in the evening with meals   Nutritional Supplements (JUICE PLUS FIBRE PO) Take 3 capsules by mouth in the morning  and at bedtime. Fruit, Savyon Loken and veg.   oxybutynin (DITROPAN-XL) 5 MG 24 hr tablet Take 5 mg by mouth at bedtime.   PHENObarbital (LUMINAL) 100 MG tablet Take 100 mg by mouth at bedtime.   polyvinyl alcohol (LIQUIFILM TEARS) 1.4 % ophthalmic solution Place 1 drop into both eyes as needed for dry eyes.   potassium chloride SA (KLOR-CON M) 10 MEQ tablet Take 2 tablets (20 mEq total) by mouth daily.   RYBELSUS 3 MG TABS Take 1 tablet by mouth every morning.   Simethicone 180 MG CAPS  Take 180 mg by mouth as needed (for gas).    warfarin (COUMADIN) 5 MG tablet Take 1 tablet (5 mg total) by mouth daily. Taking 7.5mg  on Tuesday, Wednesday, and 10 mg on Thursday     Allergies  Allergen Reactions   Dilantin [Phenytoin] Hives and Other (See Comments)    Whelps all over for two weeks   Statins Other (See Comments)    High dose statin - dizziness    Social History   Socioeconomic History   Marital status: Widowed    Spouse name: Not on file   Number of children: Not on file   Years of education: Not on file   Highest education level: Not on file  Occupational History   Not on file  Tobacco Use   Smoking status: Never   Smokeless tobacco: Never   Tobacco comments:    Never smoked 05/14/23  Vaping Use   Vaping status: Never Used  Substance and Sexual Activity   Alcohol use: No   Drug use: No   Sexual activity: Not Currently  Other Topics Concern   Not on file  Social History Narrative   Not on file   Social Determinants of Health   Financial Resource Strain: Not on file  Food Insecurity: No Food Insecurity (04/06/2023)   Hunger Vital Sign    Worried About Running Out of Food in the Last Year: Never true    Ran Out of Food in the Last Year: Never true  Transportation Needs: No Transportation Needs (04/06/2023)   PRAPARE - Administrator, Civil Service (Medical): No    Lack of Transportation (Non-Medical): No  Physical Activity: Not on file  Stress: Not on file  Social Connections: Not on file  Intimate Partner Violence: Not At Risk (04/06/2023)   Humiliation, Afraid, Rape, and Kick questionnaire    Fear of Current or Ex-Partner: No    Emotionally Abused: No    Physically Abused: No    Sexually Abused: No     Review of Systems: General: negative for chills, fever, night sweats or weight changes.  Cardiovascular: negative for chest pain, dyspnea on exertion, edema, orthopnea, palpitations, paroxysmal nocturnal dyspnea or shortness of  breath Dermatological: negative for rash Respiratory: negative for cough or wheezing Urologic: negative for hematuria Abdominal: negative for nausea, vomiting, diarrhea, bright red blood per rectum, melena, or hematemesis Neurologic: negative for visual changes, syncope, or dizziness All other systems reviewed and are otherwise negative except as noted above.    Blood pressure 138/74, pulse 81, height 5\' 4"  (1.626 m), weight 177 lb 12.8 oz (80.6 kg), SpO2 94%.  General appearance: alert and no distress Neck: no adenopathy, no carotid bruit, no JVD, supple, symmetrical, trachea midline, and thyroid not enlarged, symmetric, no tenderness/mass/nodules Lungs: clear to auscultation bilaterally Heart: 2/6 outflow tract murmur consistent with aortic stenosis. Extremities: extremities normal, atraumatic, no cyanosis or edema Pulses: 2+ and symmetric Skin:  Skin color, texture, turgor normal. No rashes or lesions Neurologic: Grossly normal  EKG not performed today      ASSESSMENT AND PLAN:   PAF (paroxysmal atrial fibrillation) (HCC) History of PAF maintaining sinus rhythm on Tikosyn and warfarin oral anticoagulation.  She sees Dr. Nelly Laurence.  Apparently a A-fib ablation is scheduled for 07/30/2023.  Right bundle branch block Chronic  Essential hypertension History of essential hypertension with blood pressure measured today at 138/74.  She is on amlodipine, and losartan.  Dyslipidemia, goal LDL below 70 History of dyslipidemia on statin therapy with lipid profile performed 10/22/2021 revealing a total cholesterol 182, LDL 101 and HDL 46.  I am going to get a coronary calcium score to her stratify.  Aortic stenosis, moderate History of moderate aortic stenosis by 2D echo performed 09/03/2022.  Her valve area measured 0.77 cm with a peak gradient of 32 mmHg.  This will be repeated on an annual basis.  She does complain of exertional chest pain when walking up a hill which is new over the last  year.  Unclear to me whether this is related to her aortic stenosis or whether she has concomitant CAD.  I am going to get a cardiac PET to differentiate.     Runell Gess MD FACP,FACC,FAHA, [facility] 06/16/2023 11:55 AM

## 2023-06-16 NOTE — Assessment & Plan Note (Signed)
History of moderate aortic stenosis by 2D echo performed 09/03/2022.  Her valve area measured 0.77 cm with a peak gradient of 32 mmHg.  This will be repeated on an annual basis.  She does complain of exertional chest pain when walking up a hill which is new over the last year.  Unclear to me whether this is related to her aortic stenosis or whether she has concomitant CAD.  I am going to get a cardiac PET to differentiate.

## 2023-06-16 NOTE — Assessment & Plan Note (Signed)
Chronic. 

## 2023-06-18 ENCOUNTER — Ambulatory Visit (HOSPITAL_BASED_OUTPATIENT_CLINIC_OR_DEPARTMENT_OTHER)
Admission: RE | Admit: 2023-06-18 | Discharge: 2023-06-18 | Disposition: A | Discharge status: Home / Self Care | Payer: Medicare Other | Source: Ambulatory Visit | Attending: Cardiovascular Disease | Admitting: Cardiovascular Disease

## 2023-06-18 DIAGNOSIS — I10 Essential (primary) hypertension: Secondary | ICD-10-CM | POA: Insufficient documentation

## 2023-06-18 DIAGNOSIS — I480 Paroxysmal atrial fibrillation: Secondary | ICD-10-CM | POA: Insufficient documentation

## 2023-06-18 DIAGNOSIS — I4510 Unspecified right bundle-branch block: Secondary | ICD-10-CM | POA: Insufficient documentation

## 2023-06-18 DIAGNOSIS — I350 Nonrheumatic aortic (valve) stenosis: Secondary | ICD-10-CM | POA: Insufficient documentation

## 2023-06-18 DIAGNOSIS — E785 Hyperlipidemia, unspecified: Secondary | ICD-10-CM | POA: Insufficient documentation

## 2023-06-30 ENCOUNTER — Telehealth: Payer: Self-pay

## 2023-06-30 DIAGNOSIS — I480 Paroxysmal atrial fibrillation: Secondary | ICD-10-CM

## 2023-06-30 NOTE — Telephone Encounter (Signed)
I called pt to go over Ablation instructions - She is scheduled with Dr. Nelly Laurence on 07/30/23 at 12:30.   She has an Office Visit with Dr. Nelly Laurence on 12/12 and will get her instruction letters at that time - she will get labs at [facility] on that day also.   She called her PCP and scheduled her 3 INR appts. She is scheduled for 12/16, 12/23 & 12/30. I faxed them a letter stating what we needed and why we needed them. They will fax [place] the results.

## 2023-07-06 NOTE — Progress Notes (Unsigned)
Electrophysiology Office Note:   Date:  07/07/2023  ID:  Julia Rose, DOB 05-30-1946, MRN [id number]  Primary Cardiologist: Nanetta Batty, MD Electrophysiologist: Maurice Small, MD      History of Present Illness:   Julia Rose is a 77 y.o. female with h/o persistent atrial fibrillation, AFL, HTN, obesity, VHD seen today for routine electrophysiology followup.   She has a long hx of AF, dating back to at least 2016.  She has required DCCV in the past but had ERAF.  She was on sotalol from 03/2018 to 03/2023 when she was transitioned to Tikosyn.  Since Tikosyn loading she reports she has been in/out of AF/AFL about 50% NSR / 50% of time AF/AFL. She is planned for AF ablation in 07/2023.   Since last being seen in our clinic the patient reports doing well overall. She notes that she is in AF about half the time and in NSR about half the time. Denies activity limitations. Reports she remains very active in her church.  She does have fatigue when in AF/AFL and is not as active.   She denies chest pain, palpitations, dyspnea, PND, orthopnea, nausea, vomiting, dizziness, syncope, edema, weight gain, or early satiety.   Review of systems complete and found to be negative unless listed in HPI.   EP Information / Studies Reviewed:    EKG is ordered today. Personal review as below.  EKG Interpretation Date/Time:  Wednesday July 07 2023 13:31:09 EST Ventricular Rate:  111 PR Interval:    QRS Duration:  156 QT Interval:  378 QTC Calculation: 514 R Axis:   64  Text Interpretation: Atrial flutter with rapid ventricular response Right bundle branch block Confirmed by Canary Brim ([id number]) on 07/07/2023 3:30:32 PM   Studies:  ECHO 08/2022 > LVEF 60-65%, no RWMA, RV systolic moderately reduced, mildly elevated PA systolic pressure    Arrhythmia / AAD AF > dx ~ 2016  DCCV 11/2016 > NSR with ERAF after 3d Sotalol 03/2018 loading > failed 03/2023 Planned DCCV 12/2022 but canceled on  arrival due to NSR  Tikosyn loading 03/2023   Risk Assessment/Calculations:    CHA2DS2-VASc Score = 4   This indicates a 4.8% annual risk of stroke. The patient's score is based upon: CHF History: 0 HTN History: 1 Diabetes History: 0 Stroke History: 0 Vascular Disease History: 0 Age Score: 2 Gender Score: 1         Physical Exam:   VS:  BP (!) 150/84 (BP Location: Left Arm, Patient Position: Sitting, Cuff Size: Normal)   Pulse 78   Resp 16   Ht 5\' 4"  (1.626 m)   Wt 175 lb 3.2 oz (79.5 kg)   SpO2 94%   BMI 30.07 kg/m    Wt Readings from Last 3 Encounters:  07/07/23 175 lb 3.2 oz (79.5 kg)  06/16/23 177 lb 12.8 oz (80.6 kg)  05/14/23 182 lb 9.6 oz (82.8 kg)     GEN: Well nourished, well developed in no acute distress NECK: No JVD; No carotid bruits CARDIAC: irregular rate and rhythm, 2-3/6 SEM RESPIRATORY:  Clear to auscultation without rales, wheezing or rhonchi  ABDOMEN: Soft, non-tender, non-distended EXTREMITIES:  No edema; No deformity   ASSESSMENT AND PLAN:    Persistent Atrial Fibrillation AFL High Risk Medication Monitoring  CHA2DS2-VASc 4 -planned for AF ablation 07/30/23, in AFL at visit 07/07/23, discussed addition of CTI ablation as well with Dr. Nelly Laurence > will plan accordingly -pending CT chest for pulmonary morphology  -  pre-procedure labs > CBC, BMP, add Mg+ with Tikosyn  -procedure reviewed with patient  -instructions given for NPO after MN, meds etc in written format to patient -continue Tikosyn 250 mcg BID  Secondary Hypercoagulable State  -continue coumadin, managed by coumadin clinic   Hypertension  -elevated on today's check, reports she had to walk a long way to get into the clinic -follow BP trend at home and let us know if remains elevated  HLD CAD  -elevated calcium score > pending PET CT per Dr. Allyson Sabal  -per primary   VHD  Moderate AS  -follows with Dr. Allyson Sabal, serial ECHO   Follow up with Dr. Nelly Laurence  as planned for AF ablation    Signed, Canary Brim, MSN, APRN, NP-C, AGACNP-BC [facility] - Electrophysiology  07/07/2023, 3:37 PM

## 2023-07-07 ENCOUNTER — Encounter: Payer: Self-pay | Admitting: Pulmonary Disease

## 2023-07-07 ENCOUNTER — Ambulatory Visit: Payer: Medicare Other | Attending: Pulmonary Disease | Admitting: Pulmonary Disease

## 2023-07-07 VITALS — BP 150/84 | HR 78 | Resp 16 | Ht 64.0 in | Wt 175.2 lb

## 2023-07-07 DIAGNOSIS — Z5181 Encounter for therapeutic drug level monitoring: Secondary | ICD-10-CM

## 2023-07-07 DIAGNOSIS — I4819 Other persistent atrial fibrillation: Secondary | ICD-10-CM

## 2023-07-07 DIAGNOSIS — D6869 Other thrombophilia: Secondary | ICD-10-CM | POA: Diagnosis not present

## 2023-07-07 DIAGNOSIS — I10 Essential (primary) hypertension: Secondary | ICD-10-CM | POA: Diagnosis not present

## 2023-07-07 DIAGNOSIS — Z01812 Encounter for preprocedural laboratory examination: Secondary | ICD-10-CM

## 2023-07-07 DIAGNOSIS — Z79899 Other long term (current) drug therapy: Secondary | ICD-10-CM

## 2023-07-07 DIAGNOSIS — I350 Nonrheumatic aortic (valve) stenosis: Secondary | ICD-10-CM

## 2023-07-07 NOTE — Patient Instructions (Signed)
Medication Instructions:  Your physician recommends that you continue on your current medications as directed. Please refer to the Current Medication list given to you today.  *If you need a refill on your cardiac medications before your next appointment, please call your pharmacy*  Lab Work: Please get lab work ASAP  If you have labs (blood work) drawn today and your tests are completely normal, you will receive your results only by: MyChart Message (if you have MyChart) OR A paper copy in the mail If you have any lab test that is abnormal or we need to change your treatment, we will call you to review the results.  Testing/Procedures: Atrial Fibrillation Ablation is scheduled for 07/30/23  Follow-Up: At [facility], you and your health needs are our priority.  As part of our continuing mission to provide you with exceptional heart care, we have created designated Provider Care Teams.  These Care Teams include your primary Cardiologist (physician) and Advanced Practice Providers (APPs -  Physician Assistants and Nurse Practitioners) who all work together to provide you with the care you need, when you need it.   Your next appointment:   To be scheduled    Important Information About Sugar

## 2023-07-08 ENCOUNTER — Ambulatory Visit: Payer: Medicare Other | Admitting: Cardiovascular Disease

## 2023-07-08 LAB — BASIC METABOLIC PANEL
BUN/Creatinine Ratio: 13 (ref 12–28)
BUN: 10 mg/dL (ref 8–27)
CO2: 17 mmol/L — ABNORMAL LOW (ref 20–29)
Calcium: 10 mg/dL (ref 8.7–10.3)
Chloride: 103 mmol/L (ref 96–106)
Creatinine, Ser: 0.78 mg/dL (ref 0.57–1.00)
Glucose: 128 mg/dL — ABNORMAL HIGH (ref 70–99)
Potassium: 5.5 mmol/L — ABNORMAL HIGH (ref 3.5–5.2)
Sodium: 143 mmol/L (ref 134–144)
eGFR: 78 mL/min/{1.73_m2} (ref >59)

## 2023-07-08 LAB — CBC
Hematocrit: 45.6 % (ref 34.0–46.6)
Hemoglobin: 15.3 g/dL (ref 11.1–15.9)
MCH: 31.9 pg (ref 26.6–33.0)
MCHC: 33.6 g/dL (ref 31.5–35.7)
MCV: 95 fL (ref 79–97)
Platelets: 289 10*3/uL (ref 150–450)
RBC: 4.8 x10E6/uL (ref 3.77–5.28)
RDW: 12 % (ref 11.7–15.4)
WBC: 10.1 10*3/uL (ref 3.4–10.8)

## 2023-07-08 LAB — MAGNESIUM: Magnesium: 2 mg/dL (ref 1.6–2.3)

## 2023-07-14 ENCOUNTER — Telehealth (HOSPITAL_COMMUNITY): Payer: Self-pay | Admitting: *Deleted

## 2023-07-14 NOTE — Telephone Encounter (Signed)
Received call from patient regarding upcoming cardiac imaging study; pt verbalizes understanding of appt date/time, parking situation and where to check in, pre-test NPO status and medications ordered, and verified current allergies; name and call back number provided for further questions should they arise Johney Frame RN Navigator Cardiac Imaging [facility] [phone] office [phone] cell   Patient aware to avoid caffeine 12 hours prior to test.

## 2023-07-14 NOTE — Telephone Encounter (Signed)
Attempted to call patient regarding upcoming cardiac PET appointment. Left message on voicemail with name and callback number Johney Frame RN Navigator Cardiac Imaging [facility] [phone] Office

## 2023-07-15 ENCOUNTER — Ambulatory Visit
Admission: RE | Admit: 2023-07-15 | Discharge: 2023-07-15 | Disposition: A | Discharge status: Home / Self Care | Payer: Medicare Other | Source: Ambulatory Visit | Attending: Cardiovascular Disease | Admitting: Cardiovascular Disease

## 2023-07-15 DIAGNOSIS — I700 Atherosclerosis of aorta: Secondary | ICD-10-CM | POA: Insufficient documentation

## 2023-07-15 DIAGNOSIS — I480 Paroxysmal atrial fibrillation: Secondary | ICD-10-CM | POA: Insufficient documentation

## 2023-07-15 DIAGNOSIS — I10 Essential (primary) hypertension: Secondary | ICD-10-CM | POA: Diagnosis not present

## 2023-07-15 DIAGNOSIS — E785 Hyperlipidemia, unspecified: Secondary | ICD-10-CM | POA: Insufficient documentation

## 2023-07-15 DIAGNOSIS — I350 Nonrheumatic aortic (valve) stenosis: Secondary | ICD-10-CM | POA: Diagnosis not present

## 2023-07-15 DIAGNOSIS — I4510 Unspecified right bundle-branch block: Secondary | ICD-10-CM | POA: Diagnosis not present

## 2023-07-15 LAB — NM PET CT CARDIAC PERFUSION MULTI W/ABSOLUTE BLOODFLOW
MBFR: 2.78
Nuc Rest EF: 67 %
Nuc Stress EF: 66 %
Peak HR: 122 {beats}/min
Rest HR: 91 {beats}/min
Rest MBF: 0.83 ml/g/min
Rest Nuclear Isotope Dose: 20.5 mCi
SRS: 0
SSS: 1
ST Depression (mm): 0 mm
Stress MBF: 2.31 ml/g/min
Stress Nuclear Isotope Dose: 20.5 mCi
TID: 1.04

## 2023-07-15 MED ORDER — RUBIDIUM RB82 GENERATOR (RUBYFILL)
25.0000 | PACK | Freq: Once | INTRAVENOUS | Status: AC
  Administered 2023-07-15: 20.46 via INTRAVENOUS

## 2023-07-15 MED ORDER — REGADENOSON 0.4 MG/5ML IV SOLN
0.4000 mg | Freq: Once | INTRAVENOUS | Status: AC
  Administered 2023-07-15: 0.4 mg via INTRAVENOUS
  Filled 2023-07-15: qty 5

## 2023-07-15 MED ORDER — REGADENOSON 0.4 MG/5ML IV SOLN
INTRAVENOUS | Status: AC
  Filled 2023-07-15: qty 5

## 2023-07-15 MED ORDER — RUBIDIUM RB82 GENERATOR (RUBYFILL)
25.0000 | PACK | Freq: Once | INTRAVENOUS | Status: AC
  Administered 2023-07-15: 20.47 via INTRAVENOUS

## 2023-07-15 NOTE — Progress Notes (Signed)
Patient presents for a cardiac PET stress test and tolerated procedure without incident. Patient maintained acceptable vital signs throughout the test and was offered caffeine after test.  Patient ambulated out of department with a steady gait.  

## 2023-07-19 ENCOUNTER — Ambulatory Visit (HOSPITAL_COMMUNITY)
Admission: RE | Admit: 2023-07-19 | Discharge: 2023-07-19 | Disposition: A | Discharge status: Home / Self Care | Payer: Medicare Other | Source: Ambulatory Visit | Attending: Cardiovascular Disease | Admitting: Cardiovascular Disease

## 2023-07-19 ENCOUNTER — Other Ambulatory Visit (HOSPITAL_COMMUNITY): Payer: Self-pay | Admitting: Internal Medicine

## 2023-07-19 DIAGNOSIS — I480 Paroxysmal atrial fibrillation: Secondary | ICD-10-CM | POA: Diagnosis present

## 2023-07-19 MED ORDER — IOHEXOL 350 MG/ML SOLN
95.0000 mL | Freq: Once | INTRAVENOUS | Status: AC | PRN
Start: 2023-07-19 — End: 2023-07-19
  Administered 2023-07-19: 95 mL via INTRAVENOUS

## 2023-07-22 ENCOUNTER — Telehealth: Payer: Self-pay

## 2023-07-22 NOTE — Telephone Encounter (Signed)
Spoke with patient, reviewed instruction letter for upcoming ablation (not on ozempic). Patient already aware to decrease potassium to 10 meq and will follow up for future instruction on dosage with her PCP. Next INR check is 07/26/23 at her PCP office, they will forward results to [place]. No questions at this time    Festus Holts, RN 07/14/2023 11:26 AM EST Back to Top    Attempted to contact patient, left voicemail to call our office back - also sent MyChart Message with details but patient instructed to follow up with [place] regardless. Unsure if she ever stopped taking potassium as this was just instructions left on a voicemail on 07/08/23   July 13, 2023 Mealor, Roberts Gaudy, MD to [place], April, CMA  Me AM   07/13/23  8:59 PM  She should resume potassium but can drop back to one tablet.   July 08, 2023 Sharin Grave, RN     07/08/23 11:02 AM Result Note Attempted to contact pt to review these results.  Left message for pt to hold her K+ supplement at this time and she will be contacted as to when she should have blood work repeated.  Requested she call back with any questions or concerns.

## 2023-07-23 ENCOUNTER — Telehealth: Payer: Self-pay | Admitting: Cardiovascular Disease

## 2023-07-23 NOTE — Telephone Encounter (Signed)
 Called patient with no answer. Left message to return call.

## 2023-07-23 NOTE — Telephone Encounter (Signed)
Patient called to see if she can get an order of potassium level sent fax over to [phone] to [facility]

## 2023-07-26 ENCOUNTER — Telehealth: Payer: Self-pay | Admitting: Cardiovascular Disease

## 2023-07-26 ENCOUNTER — Encounter: Payer: Self-pay | Admitting: General Practice

## 2023-07-26 DIAGNOSIS — E875 Hyperkalemia: Secondary | ICD-10-CM

## 2023-07-26 NOTE — Telephone Encounter (Signed)
Lab Order was suppose to be faxed to [facility]. Please advise

## 2023-07-26 NOTE — Telephone Encounter (Signed)
Called and spoke with patient who states she went to PCP office this morning for INR to be checked and told them there was to be an order to repeat her potassium level, but PCP advised we would need to send them an order. Called PCP office who states they drew the blood, but will need the lab requisition faxed to their office to process it (they use [facility] as well). BMET order entered, released, and faxed.

## 2023-07-29 ENCOUNTER — Encounter (HOSPITAL_COMMUNITY): Payer: Self-pay

## 2023-07-29 ENCOUNTER — Telehealth: Payer: Self-pay | Admitting: Cardiovascular Disease

## 2023-07-29 DIAGNOSIS — E785 Hyperlipidemia, unspecified: Secondary | ICD-10-CM

## 2023-07-29 NOTE — Telephone Encounter (Signed)
 Patient would like a call back to discuss CT results from November and December.

## 2023-07-30 MED ORDER — ATORVASTATIN CALCIUM 40 MG PO TABS: 40.0000 mg | ORAL_TABLET | Freq: Every day | ORAL | 3 refills | Status: DC

## 2023-07-30 NOTE — Telephone Encounter (Signed)
 Spoke with patient concerning recent CT's scan - upon reviewing results patient states she wasn't made aware of Dr Court recommendations on increasing her Lipitor, repeat labs, or coronary CTA. Patient agrees with plan but wanted to confirm with Dr Court that he would still want the coronary CT since she has also completed the PET CT as well on 12/19. Will forward to Dr Court and his nurse to confirm on Monday.  Lipitor sent in to patient pharmacy, lipids ordered and released for 10/2023.    CT CARDIAC SCORING (SELF PAY ONLY): Result Notes   Dorn JINNY Court, MD 06/27/2023 11:06 AM EST     LDL 101 on Atorva 20 . CCS 191. LDL goal <70. Increase Atorva to 40 mg and re check FLP 3 months. Given increased CCS and recent effort angina, order a Cor CTA then back to see me of an APP to review   Federico LITTIE Ferretti, RN 06/23/2023  2:15 PM EST     Results released to MyChart with comments   Dorn JINNY Court, MD 06/21/2023  4:15 PM EST     Given her elevated CCS and effort angina, please order a cor CTA

## 2023-07-30 NOTE — Telephone Encounter (Signed)
 2nd attempt to call patient, no answer left message requesting a call back.

## 2023-08-02 NOTE — Telephone Encounter (Signed)
 3rd attempt to call patient, no answer, left message requesting a call back Nursing will await for patient to return call

## 2023-08-02 NOTE — Telephone Encounter (Signed)
 Spoke with patient, made aware that Dr Court didn't want to pursue a coronary CTA at this time. Patient states she did have her INR drawn today at her PCP, they should be faxing it over to us  once resulted. No further needs at this time.   Court Dorn PARAS, MD to Me    07/31/23  3:02 PM Her Cardiac PET is NL/Low risk so there is no reason to pursue a Coronary CTA

## 2023-08-03 NOTE — Pre-Procedure Instructions (Signed)
 Instructed patient on the following items: Arrival time 0615 Nothing to eat or drink after midnight No meds AM of procedure Responsible person to drive you home and stay with you for 24 hrs  Have you missed any doses of anti-coagulant Coumadin - takes once a day, hasn't missed any doses.

## 2023-08-04 ENCOUNTER — Ambulatory Visit (HOSPITAL_COMMUNITY)
Admission: RE | Admit: 2023-08-04 | Discharge: 2023-08-04 | Disposition: A | Discharge status: Home / Self Care | Payer: Medicare Other | Attending: Cardiovascular Disease | Admitting: Cardiovascular Disease

## 2023-08-04 ENCOUNTER — Encounter (HOSPITAL_COMMUNITY)
Admission: RE | Disposition: A | Discharge status: Home / Self Care | Payer: Self-pay | Source: Home / Self Care | Attending: Cardiovascular Disease

## 2023-08-04 ENCOUNTER — Telehealth: Payer: Self-pay

## 2023-08-04 ENCOUNTER — Other Ambulatory Visit: Payer: Self-pay

## 2023-08-04 DIAGNOSIS — E785 Hyperlipidemia, unspecified: Secondary | ICD-10-CM | POA: Diagnosis not present

## 2023-08-04 DIAGNOSIS — Z79899 Other long term (current) drug therapy: Secondary | ICD-10-CM | POA: Diagnosis not present

## 2023-08-04 DIAGNOSIS — Z7901 Long term (current) use of anticoagulants: Secondary | ICD-10-CM | POA: Insufficient documentation

## 2023-08-04 DIAGNOSIS — I4819 Other persistent atrial fibrillation: Secondary | ICD-10-CM | POA: Insufficient documentation

## 2023-08-04 DIAGNOSIS — I4892 Unspecified atrial flutter: Secondary | ICD-10-CM

## 2023-08-04 DIAGNOSIS — I2510 Atherosclerotic heart disease of native coronary artery without angina pectoris: Secondary | ICD-10-CM | POA: Diagnosis not present

## 2023-08-04 DIAGNOSIS — I10 Essential (primary) hypertension: Secondary | ICD-10-CM | POA: Diagnosis not present

## 2023-08-04 DIAGNOSIS — Z6830 Body mass index (BMI) 30.0-30.9, adult: Secondary | ICD-10-CM | POA: Insufficient documentation

## 2023-08-04 DIAGNOSIS — Z539 Procedure and treatment not carried out, unspecified reason: Secondary | ICD-10-CM | POA: Diagnosis not present

## 2023-08-04 DIAGNOSIS — D6869 Other thrombophilia: Secondary | ICD-10-CM | POA: Insufficient documentation

## 2023-08-04 DIAGNOSIS — E669 Obesity, unspecified: Secondary | ICD-10-CM | POA: Insufficient documentation

## 2023-08-04 LAB — PROTIME-INR
INR: 1.7 — ABNORMAL HIGH (ref 0.8–1.2)
Prothrombin Time: 20.5 s — ABNORMAL HIGH (ref 11.4–15.2)

## 2023-08-04 LAB — GLUCOSE, CAPILLARY: Glucose-Capillary: 153 mg/dL — ABNORMAL HIGH (ref 70–99)

## 2023-08-04 SURGERY — ATRIAL FIBRILLATION ABLATION
Anesthesia: General

## 2023-08-04 MED ORDER — ACETAMINOPHEN 500 MG PO TABS
1000.0000 mg | ORAL_TABLET | Freq: Once | ORAL | Status: AC
  Administered 2023-08-04: 1000 mg via ORAL
  Filled 2023-08-04: qty 2

## 2023-08-04 MED ORDER — SODIUM CHLORIDE 0.9 % IV SOLN: INTRAVENOUS | Status: DC

## 2023-08-04 MED ORDER — FENTANYL CITRATE (PF) 100 MCG/2ML IJ SOLN
INTRAMUSCULAR | Status: AC
  Filled 2023-08-04: qty 2

## 2023-08-04 NOTE — Interval H&P Note (Signed)
 History and Physical Interval Note:  08/04/2023 7:05 AM  Julia Rose  has presented today for surgery, with the diagnosis of afib.  The various methods of treatment have been discussed with the patient and family. After consideration of risks, benefits and other options for treatment, the patient has consented to  Procedure(s): ATRIAL FIBRILLATION ABLATION (N/A) as a surgical intervention.  The patient's history has been reviewed, patient examined, no change in status, stable for surgery.  I have reviewed the patient's chart and labs.  Questions were answered to the patient's satisfaction.     Marylin Lathon E Pink Maye

## 2023-08-04 NOTE — Anesthesia Preprocedure Evaluation (Signed)
 Anesthesia Evaluation  Patient identified by MRN, date of birth, ID band Patient awake    Reviewed: Allergy & Precautions, NPO status , Patient's Chart, lab work & pertinent test results  Airway Mallampati: II  TM Distance: >3 FB Neck ROM: Full    Dental  (+) Dental Advisory Given   Pulmonary asthma    breath sounds clear to auscultation       Cardiovascular hypertension, Pt. on medications + dysrhythmias Atrial Fibrillation + Valvular Problems/Murmurs AS  Rhythm:Regular Rate:Normal  Normal EF. Moderate AS   Neuro/Psych Seizures -,     GI/Hepatic negative GI ROS, Neg liver ROS,,,  Endo/Other  diabetes, Type 2, Oral Hypoglycemic Agents    Renal/GU negative Renal ROS     Musculoskeletal  (+) Arthritis ,    Abdominal   Peds  Hematology negative hematology ROS (+)   Anesthesia Other Findings   Reproductive/Obstetrics                             Anesthesia Physical Anesthesia Plan  ASA: 3  Anesthesia Plan: General   Post-op Pain Management: Tylenol  PO (pre-op)*   Induction: Intravenous  PONV Risk Score and Plan: 3 and Dexamethasone , Ondansetron , Midazolam  and Treatment may vary due to age or medical condition  Airway Management Planned: Oral ETT  Additional Equipment: None  Intra-op Plan:   Post-operative Plan: Extubation in OR  Informed Consent: I have reviewed the patients History and Physical, chart, labs and discussed the procedure including the risks, benefits and alternatives for the proposed anesthesia with the patient or authorized representative who has indicated his/her understanding and acceptance.     Dental advisory given  Plan Discussed with: CRNA  Anesthesia Plan Comments:        Anesthesia Quick Evaluation

## 2023-08-04 NOTE — Telephone Encounter (Signed)
 Spoke with patient, patient on phenobarbital  daily and unable to take eliquis  due to drug interaction. Confirmed with Dr Nancey, patient to stay on warfarin. Ablation now scheduled for 09/15/23 and patient aware will need to continue weekly INR checks.     Mealor, Eulas BRAVO, MD  Dreama Earing, CMA; Philis Harlene SAUNDERS, RN Had to cancel her today because her INR was subtherapeutic.  I've asked her to stop her warfarin. We need to start eliquis  5mg  PO BID. She should start this on the 10th. Let's try to get her rescheduled ASAP. Thx

## 2023-08-04 NOTE — Progress Notes (Signed)
 Per Dr Nelly Laurence, procedure cancelled and per Merry Proud, NP may discharge home and clinic will contact her with further instructions about coumadin  and eliquis

## 2023-08-09 LAB — LIPID PANEL
Chol/HDL Ratio: 3.2 {ratio} (ref 0.0–4.4)
Cholesterol, Total: 161 mg/dL (ref 100–199)
HDL: 50 mg/dL (ref >39)
LDL Chol Calc (NIH): 87 mg/dL (ref 0–99)
Triglycerides: 138 mg/dL (ref 0–149)
VLDL Cholesterol Cal: 24 mg/dL (ref 5–40)

## 2023-08-12 ENCOUNTER — Telehealth: Payer: Self-pay | Admitting: Cardiovascular Disease

## 2023-08-12 NOTE — Telephone Encounter (Signed)
 Unable to reach pt over the phone. LMTCB.

## 2023-08-12 NOTE — Telephone Encounter (Signed)
Patient calling in to see if its okay for her to have a covid test done. Please advise

## 2023-08-19 ENCOUNTER — Other Ambulatory Visit: Payer: Self-pay | Admitting: Family Medicine

## 2023-08-19 DIAGNOSIS — Z1231 Encounter for screening mammogram for malignant neoplasm of breast: Secondary | ICD-10-CM

## 2023-08-20 ENCOUNTER — Telehealth: Payer: Self-pay

## 2023-08-20 DIAGNOSIS — I480 Paroxysmal atrial fibrillation: Secondary | ICD-10-CM

## 2023-08-20 NOTE — Telephone Encounter (Signed)
Spoke with patient, ablation scheduled on 09/15/23 with TEE on table prior to per Dr Nelly Laurence - pre-procedure labs to be completed on 09/08/23 at [facility]. INR checks to be completed at [facility] on 1/31, 2/7, and 2/14

## 2023-08-22 ENCOUNTER — Telehealth: Payer: Self-pay | Admitting: Cardiology

## 2023-08-22 NOTE — Telephone Encounter (Signed)
Patient called in reporting she accidentally took a a extra dose of her Tikosyn this morning (250), along with her coumadin dose early (normally takes at 8pm). In review of chart, her renal function has been stable. Spoke with pharmD, will have patient remain on usual tikosyn schedule along with continuing her coumadin tomorrow on current dose regimen. She voiced understanding and thanked me for callback.

## 2023-08-27 ENCOUNTER — Ambulatory Visit (HOSPITAL_COMMUNITY): Payer: Medicare Other | Admitting: Physician Assistant

## 2023-08-28 LAB — PROTIME-INR: INR: 2.4 — AB (ref 0.80–1.20)

## 2023-09-01 ENCOUNTER — Ambulatory Visit (HOSPITAL_COMMUNITY): Payer: Medicare Other | Admitting: Physician Assistant

## 2023-09-05 NOTE — Pre-Procedure Instructions (Signed)
 Attempted to call patient regarding procedure instructions for 2/19 with anesthesia left voicemail.  Anesthesia requires Rybelsus  to be held for 24 hours before anesthesia procedure.  The last dose you can take is Monday 2/17 until after procedure.

## 2023-09-06 ENCOUNTER — Other Ambulatory Visit (HOSPITAL_COMMUNITY): Payer: Self-pay

## 2023-09-08 ENCOUNTER — Other Ambulatory Visit: Payer: Self-pay

## 2023-09-08 ENCOUNTER — Other Ambulatory Visit (HOSPITAL_COMMUNITY): Payer: Self-pay

## 2023-09-08 ENCOUNTER — Ambulatory Visit (HOSPITAL_COMMUNITY): Payer: Medicare Other | Attending: Cardiovascular Disease

## 2023-09-08 DIAGNOSIS — E785 Hyperlipidemia, unspecified: Secondary | ICD-10-CM

## 2023-09-08 DIAGNOSIS — I10 Essential (primary) hypertension: Secondary | ICD-10-CM | POA: Diagnosis present

## 2023-09-08 DIAGNOSIS — I350 Nonrheumatic aortic (valve) stenosis: Secondary | ICD-10-CM

## 2023-09-08 DIAGNOSIS — I480 Paroxysmal atrial fibrillation: Secondary | ICD-10-CM

## 2023-09-08 LAB — ECHOCARDIOGRAM COMPLETE
AR max vel: 1.03 cm2
AV Area VTI: 1.02 cm2
AV Area mean vel: 0.97 cm2
AV Mean grad: 18 mm[Hg]
AV Peak grad: 31.6 mm[Hg]
Ao pk vel: 2.81 m/s
Area-P 1/2: 5.13 cm2
P 1/2 time: 1260 ms
S' Lateral: 2.5 cm

## 2023-09-08 LAB — CBC
Hematocrit: 42.3 % (ref 34.0–46.6)
Hemoglobin: 14.3 g/dL (ref 11.1–15.9)
MCH: 32.1 pg (ref 26.6–33.0)
MCHC: 33.8 g/dL (ref 31.5–35.7)
MCV: 95 fL (ref 79–97)
Platelets: 259 10*3/uL (ref 150–450)
RBC: 4.46 x10E6/uL (ref 3.77–5.28)
RDW: 12 % (ref 11.7–15.4)
WBC: 7.8 10*3/uL (ref 3.4–10.8)

## 2023-09-09 LAB — LIPID PANEL
Chol/HDL Ratio: 2.9 {ratio} (ref 0.0–4.4)
Cholesterol, Total: 136 mg/dL (ref 100–199)
HDL: 47 mg/dL (ref >39)
LDL Chol Calc (NIH): 63 mg/dL (ref 0–99)
Triglycerides: 153 mg/dL — ABNORMAL HIGH (ref 0–149)
VLDL Cholesterol Cal: 26 mg/dL (ref 5–40)

## 2023-09-09 LAB — HEPATIC FUNCTION PANEL
ALT: 28 [IU]/L (ref 0–32)
AST: 32 [IU]/L (ref 0–40)
Albumin: 4.8 g/dL (ref 3.8–4.8)
Alkaline Phosphatase: 67 [IU]/L (ref 44–121)
Bilirubin Total: 0.3 mg/dL (ref 0.0–1.2)
Bilirubin, Direct: 0.08 mg/dL (ref 0.00–0.40)
Total Protein: 7.2 g/dL (ref 6.0–8.5)

## 2023-09-09 LAB — BASIC METABOLIC PANEL
BUN/Creatinine Ratio: 13 (ref 12–28)
BUN: 10 mg/dL (ref 8–27)
CO2: 21 mmol/L (ref 20–29)
Calcium: 10.3 mg/dL (ref 8.7–10.3)
Chloride: 104 mmol/L (ref 96–106)
Creatinine, Ser: 0.78 mg/dL (ref 0.57–1.00)
Glucose: 112 mg/dL — ABNORMAL HIGH (ref 70–99)
Potassium: 5.6 mmol/L — ABNORMAL HIGH (ref 3.5–5.2)
Sodium: 143 mmol/L (ref 134–144)
eGFR: 78 mL/min/{1.73_m2} (ref >59)

## 2023-09-10 ENCOUNTER — Other Ambulatory Visit: Payer: Self-pay | Admitting: *Deleted

## 2023-09-10 DIAGNOSIS — I480 Paroxysmal atrial fibrillation: Secondary | ICD-10-CM

## 2023-09-10 NOTE — Progress Notes (Signed)
Potassium 5.6  Pt to hold sat, sun mon Come for stat repeat on mon 2/17 per Dr. Nelly Laurence.

## 2023-09-12 ENCOUNTER — Encounter: Payer: Self-pay | Admitting: Cardiovascular Disease

## 2023-09-14 ENCOUNTER — Encounter: Payer: Self-pay | Admitting: Cardiovascular Disease

## 2023-09-14 ENCOUNTER — Encounter: Payer: Self-pay | Admitting: Family Medicine

## 2023-09-14 ENCOUNTER — Telehealth: Payer: Self-pay

## 2023-09-14 LAB — BASIC METABOLIC PANEL
BUN/Creatinine Ratio: 23 (ref 12–28)
BUN: 19 mg/dL (ref 8–27)
CO2: 24 mmol/L (ref 20–29)
Calcium: 9.7 mg/dL (ref 8.7–10.3)
Chloride: 103 mmol/L (ref 96–106)
Creatinine, Ser: 0.83 mg/dL (ref 0.57–1.00)
Glucose: 127 mg/dL — ABNORMAL HIGH (ref 70–99)
Potassium: 4.4 mmol/L (ref 3.5–5.2)
Sodium: 144 mmol/L (ref 134–144)
eGFR: 73 mL/min/{1.73_m2} (ref >59)

## 2023-09-14 NOTE — Telephone Encounter (Signed)
 Fax received from PCP - latest INR was 2.6 completed on 09/10/23. Faxed to [facility] to be scanned into patient chart    08/27/23 - INR 2.4 09/03/23 - INR 2.2 09/10/23 - INR 2.6

## 2023-09-14 NOTE — Anesthesia Preprocedure Evaluation (Signed)
 Anesthesia Evaluation  Patient identified by MRN, date of birth, ID band Patient awake    Reviewed: Allergy & Precautions, H&P , NPO status , Patient's Chart, lab work & pertinent test results  Airway Mallampati: III  TM Distance: >3 FB Neck ROM: Full    Dental no notable dental hx. (+) Partial Upper, Dental Advisory Given   Pulmonary asthma    Pulmonary exam normal breath sounds clear to auscultation       Cardiovascular Exercise Tolerance: Good hypertension, Pt. on medications + dysrhythmias Atrial Fibrillation + Valvular Problems/Murmurs AS  Rhythm:Regular Rate:Normal     Neuro/Psych negative neurological ROS  negative psych ROS   GI/Hepatic negative GI ROS, Neg liver ROS,,,  Endo/Other  diabetes, Type 2    Renal/GU negative Renal ROS  negative genitourinary   Musculoskeletal  (+) Arthritis , Osteoarthritis,    Abdominal   Peds  Hematology negative hematology ROS (+)   Anesthesia Other Findings   Reproductive/Obstetrics negative OB ROS                             Anesthesia Physical Anesthesia Plan  ASA: 3  Anesthesia Plan: General   Post-op Pain Management: Tylenol PO (pre-op)*   Induction: Intravenous  PONV Risk Score and Plan: 4 or greater and Ondansetron, Dexamethasone and Treatment may vary due to age or medical condition  Airway Management Planned: Oral ETT  Additional Equipment: ClearSight  Intra-op Plan:   Post-operative Plan: Extubation in OR  Informed Consent: I have reviewed the patients History and Physical, chart, labs and discussed the procedure including the risks, benefits and alternatives for the proposed anesthesia with the patient or authorized representative who has indicated his/her understanding and acceptance.     Dental advisory given  Plan Discussed with: CRNA  Anesthesia Plan Comments:        Anesthesia Quick Evaluation

## 2023-09-14 NOTE — Telephone Encounter (Signed)
 Spoke with receptionist at [facility], patient did have INR completed last week and they will fax over copy of results for [place]. Couldn't confirm what INR result was for this past week while on phone. Patients other INR checks are under media tab in chart. Instructed PCP office that patient procedure is tomorrow and vital we receive this information today.   08/27/23 - INR 2.4 09/03/23 - INR 2.2

## 2023-09-14 NOTE — Pre-Procedure Instructions (Signed)
 Attempted to call patient regarding procedure instructions.  Left voicemail on the following items: Arrival time 0600 Nothing to eat or drink after midnight No meds AM of procedure Responsible person to drive you home and stay with you for 24 hrs  Have you missed any doses of anti-coagulant Coumadin-should be taken once a day, if you have missed any doses please let us know.   Hold Rybelsus today

## 2023-09-15 ENCOUNTER — Ambulatory Visit (HOSPITAL_COMMUNITY): Payer: Medicare Other | Admitting: Anesthesiology

## 2023-09-15 ENCOUNTER — Ambulatory Visit (HOSPITAL_BASED_OUTPATIENT_CLINIC_OR_DEPARTMENT_OTHER): Payer: Medicare Other | Admitting: Anesthesiology

## 2023-09-15 ENCOUNTER — Ambulatory Visit (HOSPITAL_COMMUNITY)
Admission: RE | Admit: 2023-09-15 | Discharge: 2023-09-15 | Disposition: A | Discharge status: Home / Self Care | Payer: Medicare Other | Attending: Cardiovascular Disease | Admitting: Cardiovascular Disease

## 2023-09-15 ENCOUNTER — Other Ambulatory Visit: Payer: Self-pay

## 2023-09-15 ENCOUNTER — Encounter (HOSPITAL_COMMUNITY): Payer: Self-pay | Admitting: Cardiovascular Disease

## 2023-09-15 ENCOUNTER — Encounter (HOSPITAL_COMMUNITY)
Admission: RE | Disposition: A | Discharge status: Home / Self Care | Payer: Self-pay | Source: Home / Self Care | Attending: Cardiovascular Disease

## 2023-09-15 DIAGNOSIS — I10 Essential (primary) hypertension: Secondary | ICD-10-CM | POA: Insufficient documentation

## 2023-09-15 DIAGNOSIS — I4891 Unspecified atrial fibrillation: Secondary | ICD-10-CM | POA: Diagnosis not present

## 2023-09-15 DIAGNOSIS — Z79899 Other long term (current) drug therapy: Secondary | ICD-10-CM | POA: Diagnosis not present

## 2023-09-15 DIAGNOSIS — I4892 Unspecified atrial flutter: Secondary | ICD-10-CM | POA: Diagnosis not present

## 2023-09-15 DIAGNOSIS — Z7901 Long term (current) use of anticoagulants: Secondary | ICD-10-CM | POA: Insufficient documentation

## 2023-09-15 DIAGNOSIS — I4819 Other persistent atrial fibrillation: Secondary | ICD-10-CM | POA: Insufficient documentation

## 2023-09-15 DIAGNOSIS — I350 Nonrheumatic aortic (valve) stenosis: Secondary | ICD-10-CM | POA: Diagnosis not present

## 2023-09-15 DIAGNOSIS — Z8719 Personal history of other diseases of the digestive system: Secondary | ICD-10-CM

## 2023-09-15 DIAGNOSIS — D6869 Other thrombophilia: Secondary | ICD-10-CM | POA: Insufficient documentation

## 2023-09-15 DIAGNOSIS — Z6834 Body mass index (BMI) 34.0-34.9, adult: Secondary | ICD-10-CM | POA: Insufficient documentation

## 2023-09-15 DIAGNOSIS — E669 Obesity, unspecified: Secondary | ICD-10-CM | POA: Diagnosis not present

## 2023-09-15 DIAGNOSIS — E119 Type 2 diabetes mellitus without complications: Secondary | ICD-10-CM | POA: Insufficient documentation

## 2023-09-15 HISTORY — PX: ATRIAL FIBRILLATION ABLATION: EP1191

## 2023-09-15 LAB — POCT ACTIVATED CLOTTING TIME
Activated Clotting Time: 222 s
Activated Clotting Time: >1000 s

## 2023-09-15 LAB — GLUCOSE, CAPILLARY
Glucose-Capillary: 115 mg/dL — ABNORMAL HIGH (ref 70–99)
Glucose-Capillary: 148 mg/dL — ABNORMAL HIGH (ref 70–99)

## 2023-09-15 LAB — PROTIME-INR
INR: 2.4 — ABNORMAL HIGH (ref 0.8–1.2)
Prothrombin Time: 26.4 s — ABNORMAL HIGH (ref 11.4–15.2)

## 2023-09-15 SURGERY — ATRIAL FIBRILLATION ABLATION
Anesthesia: General

## 2023-09-15 MED ORDER — ATROPINE SULFATE 1 MG/ML IV SOLN
INTRAVENOUS | Status: DC | PRN
  Administered 2023-09-15: 1 mg via INTRAVENOUS

## 2023-09-15 MED ORDER — SODIUM CHLORIDE 0.9% FLUSH: 3.0000 mL | Freq: Two times a day (BID) | INTRAVENOUS | Status: DC

## 2023-09-15 MED ORDER — SODIUM CHLORIDE 0.9% FLUSH: 3.0000 mL | INTRAVENOUS | Status: DC | PRN

## 2023-09-15 MED ORDER — FENTANYL CITRATE (PF) 100 MCG/2ML IJ SOLN
INTRAMUSCULAR | Status: AC
  Filled 2023-09-15: qty 2

## 2023-09-15 MED ORDER — ROCURONIUM BROMIDE 10 MG/ML (PF) SYRINGE
PREFILLED_SYRINGE | INTRAVENOUS | Status: DC | PRN
  Administered 2023-09-15: 20 mg via INTRAVENOUS
  Administered 2023-09-15 (×2): 10 mg via INTRAVENOUS
  Administered 2023-09-15: 50 mg via INTRAVENOUS

## 2023-09-15 MED ORDER — SODIUM CHLORIDE 0.9 % IV SOLN: INTRAVENOUS | Status: DC

## 2023-09-15 MED ORDER — ACETAMINOPHEN 325 MG PO TABS
650.0000 mg | ORAL_TABLET | ORAL | Status: DC | PRN
  Administered 2023-09-15: 650 mg via ORAL
  Filled 2023-09-15: qty 2

## 2023-09-15 MED ORDER — SUGAMMADEX SODIUM 200 MG/2ML IV SOLN
INTRAVENOUS | Status: DC | PRN
  Administered 2023-09-15: 200 mg via INTRAVENOUS

## 2023-09-15 MED ORDER — DEXAMETHASONE SODIUM PHOSPHATE 10 MG/ML IJ SOLN
INTRAMUSCULAR | Status: DC | PRN
  Administered 2023-09-15: 5 mg via INTRAVENOUS

## 2023-09-15 MED ORDER — LIDOCAINE 2% (20 MG/ML) 5 ML SYRINGE
INTRAMUSCULAR | Status: DC | PRN
  Administered 2023-09-15: 60 mg via INTRAVENOUS

## 2023-09-15 MED ORDER — FENTANYL CITRATE (PF) 250 MCG/5ML IJ SOLN
INTRAMUSCULAR | Status: DC | PRN
  Administered 2023-09-15 (×2): 50 ug via INTRAVENOUS

## 2023-09-15 MED ORDER — SODIUM CHLORIDE 0.9 % IV SOLN: 250.0000 mL | INTRAVENOUS | Status: DC | PRN

## 2023-09-15 MED ORDER — PROPOFOL 10 MG/ML IV BOLUS
INTRAVENOUS | Status: DC | PRN
Start: 2023-09-15 — End: 2023-09-15
  Administered 2023-09-15: 100 mg via INTRAVENOUS

## 2023-09-15 MED ORDER — ACETAMINOPHEN 500 MG PO TABS
ORAL_TABLET | ORAL | Status: AC
  Administered 2023-09-15: 1000 mg via ORAL
  Filled 2023-09-15: qty 2

## 2023-09-15 MED ORDER — ATROPINE SULFATE 1 MG/10ML IJ SOSY
PREFILLED_SYRINGE | INTRAMUSCULAR | Status: AC
  Filled 2023-09-15: qty 10

## 2023-09-15 MED ORDER — ACETAMINOPHEN 500 MG PO TABS: 1000.0000 mg | ORAL_TABLET | Freq: Once | ORAL | Status: AC

## 2023-09-15 MED ORDER — HEPARIN (PORCINE) IN NACL 1000-0.9 UT/500ML-% IV SOLN
INTRAVENOUS | Status: DC | PRN
Start: 2023-09-15 — End: 2023-09-15
  Administered 2023-09-15 (×3): 500 mL

## 2023-09-15 MED ORDER — PROTAMINE SULFATE 10 MG/ML IV SOLN
INTRAVENOUS | Status: DC | PRN
  Administered 2023-09-15: 40 mg via INTRAVENOUS
  Administered 2023-09-15: 10 mg via INTRAVENOUS

## 2023-09-15 MED ORDER — HEPARIN SODIUM (PORCINE) 1000 UNIT/ML IJ SOLN
INTRAMUSCULAR | Status: DC | PRN
Start: 2023-09-15 — End: 2023-09-15
  Administered 2023-09-15: 1000 [IU] via INTRAVENOUS

## 2023-09-15 MED ORDER — HEPARIN SODIUM (PORCINE) 1000 UNIT/ML IJ SOLN
INTRAMUSCULAR | Status: DC | PRN
  Administered 2023-09-15: 14000 [IU] via INTRAVENOUS

## 2023-09-15 MED ORDER — ONDANSETRON HCL 4 MG/2ML IJ SOLN: 4.0000 mg | Freq: Four times a day (QID) | INTRAMUSCULAR | Status: DC | PRN

## 2023-09-15 MED ORDER — PHENYLEPHRINE HCL-NACL 20-0.9 MG/250ML-% IV SOLN
INTRAVENOUS | Status: DC | PRN
  Administered 2023-09-15: 20 ug/min via INTRAVENOUS

## 2023-09-15 MED ORDER — ONDANSETRON HCL 4 MG/2ML IJ SOLN
INTRAMUSCULAR | Status: DC | PRN
  Administered 2023-09-15: 4 mg via INTRAVENOUS

## 2023-09-15 SURGICAL SUPPLY — 22 items
BAG SNAP BAND KOVER 36X36 (MISCELLANEOUS) IMPLANT
CABLE PFA RX CATH CONN (CABLE) IMPLANT
CATH ABLAT QDOT MICRO BI TC DF (CATHETERS) IMPLANT
CATH FARAWAVE ABLATION 31 (CATHETERS) IMPLANT
CATH OCTARAY 2.0 F 3-3-3-3-3 (CATHETERS) IMPLANT
CATH SOUNDSTAR ECO 8FR (CATHETERS) IMPLANT
CATH WEBSTER BI DIR CS D-F CRV (CATHETERS) IMPLANT
CLOSURE PERCLOSE PROSTYLE (VASCULAR PRODUCTS) IMPLANT
COVER SWIFTLINK CONNECTOR (BAG) ×1 IMPLANT
DEVICE CLOSURE MYNXGRIP 6/7F (Vascular Products) IMPLANT
DILATOR VESSEL 38 20CM 16FR (INTRODUCER) IMPLANT
GUIDEWIRE ANGLED .035X150CM (WIRE) IMPLANT
GUIDEWIRE INQWIRE 1.5J.035X260 (WIRE) IMPLANT
INQWIRE 1.5J .035X260CM (WIRE) ×1 IMPLANT
KIT VERSACROSS CNCT FARADRIVE (KITS) IMPLANT
PACK EP LF (CUSTOM PROCEDURE TRAY) ×1 IMPLANT
PAD DEFIB RADIO PHYSIO CONN (PAD) ×1 IMPLANT
PATCH CARTO3 (PAD) IMPLANT
SHEATH FARADRIVE STEERABLE (SHEATH) IMPLANT
SHEATH PINNACLE 8F 10CM (SHEATH) IMPLANT
SHEATH PINNACLE 9F 10CM (SHEATH) IMPLANT
SHEATH PROBE COVER 6X72 (BAG) IMPLANT

## 2023-09-15 NOTE — H&P (Signed)
 Electrophysiology Office Note:    Date:  09/15/2023   ID:  Julia Rose, DOB 1946-01-17, MRN [id number]  PCP:  Kaleen Mask, MD   [facility] Providers Cardiologist:  Nanetta Batty, MD Electrophysiologist:  Maurice Small, MD     Referring MD: No ref. provider found   History of Present Illness:    Julia Rose is a 78 y.o. female with a medical history significant for atrial flutter, atrial fibrillation, moderate aortic stenosis referred for arrhythmia management.     She was originally referred to EP by Dr. Gery Pray in December 2016.  Her atrial fibrillation was paroxysmal at that time, she complained of fatigue, shortness of breath, and chest heaviness.  In 2018, her atrial fibrillation was classified as persistent, and she continued to have shortness of breath and fatigue with exertion.    She underwent cardioversion but had recurrence of atrial fibrillation within 3 days.  She was then managed with sotalol.  She cardioverted to sinus rhythm during the drug load.  In follow-up over the subsequent years, she continued to have paroxysmal atrial fibrillation.  In May 2024, she had progressed to AF persistent despite sotalol.  In June 2024, she was noted to be in a rate controlled atrial flutter.  At this point, she has been persistently in either atrial fibrillation or flutter for the past few months       She returns for ablation today. INR has been therapeutic for three weeks. In sinus rhythm today.   EKGs/Labs/Other Studies Reviewed Today:    Echocardiogram:  TTE September 03, 2022 EF 60 to 65%.  RV is moderately enlarged with mild elevated pulmonary artery systolic pressure.  There is mild mitral regurgitation.  The left and right atria are normal in size   Monitors:   Stress testing:   Advanced imaging:   Cardiac catherization   EKG:         Physical Exam:    VS:  BP 120/75   Pulse 72   Temp 97.9 F (36.6 C) (Oral)   Resp 16    Ht 5' (1.524 m)   Wt 79.4 kg   SpO2 98%   BMI 34.19 kg/m     Wt Readings from Last 3 Encounters:  09/15/23 79.4 kg  08/04/23 79.4 kg  07/15/23 79.4 kg     GEN:  Well nourished, well developed in no acute distress CARDIAC: iRRR, no murmurs, rubs, gallops RESPIRATORY:  Normal work of breathing MUSCULOSKELETAL: no edema    ASSESSMENT & PLAN:    Persistent atrial fibrillation and flutter Diagnosed in 2016, managed with sotalol She has continued to have paroxysms over the past several years, now persistently in AF on sotalol Will proceed with ablation today   We discussed the indication, rationale, logistics, anticipated benefits, and potential risks of the ablation procedure including but not limited to -- bleed at the groin access site, chest pain, damage to nearby organs such as the diaphragm, lungs, or esophagus, need for a drainage tube, or prolonged hospitalization. I explained that the risk for stroke, heart attack, need for open chest surgery, or even death is very low but not zero. she  expressed understanding and wishes to proceed.   Secondary hypercoagulable state Continue warfarin Will evaluate LAA with ICE prior to transseptal  Obesity Discussed the importance of weight loss for maintenance of sinus rhythm. Will make an effort to lose weight prior to her ablation    Signed, Maurice Small, MD  09/15/2023 7:15 AM    [facility]

## 2023-09-15 NOTE — Progress Notes (Signed)
 Patient walked to the bathroom without difficulties. Bilateral groins level 0, clean, dry, and intact.

## 2023-09-15 NOTE — Discharge Instructions (Signed)

## 2023-09-15 NOTE — Anesthesia Procedure Notes (Signed)
 Procedure Name: Intubation Date/Time: 09/15/2023 8:43 AM  Performed by: Loleta Caelie [place], CRNAPre-anesthesia Checklist: Patient identified, Patient being monitored, Timeout performed, Emergency Drugs available and Suction available Patient Re-evaluated:Patient Re-evaluated prior to induction Oxygen Delivery Method: Circle system utilized Preoxygenation: Pre-oxygenation with 100% oxygen Induction Type: IV induction Ventilation: Mask ventilation without difficulty Laryngoscope Size: Mac and 3 Grade View: Grade I Tube type: Oral Tube size: 7.0 mm Number of attempts: 1 Airway Equipment and Method: Stylet Placement Confirmation: ETT inserted through vocal cords under direct vision, positive ETCO2 and breath sounds checked- equal and bilateral Secured at: 20 cm Tube secured with: Tape Dental Injury: Teeth and Oropharynx as per pre-operative assessment

## 2023-09-15 NOTE — Anesthesia Postprocedure Evaluation (Signed)
 Anesthesia Post Note  Patient: Julia Rose  Procedure(s) Performed: ATRIAL FIBRILLATION ABLATION     Patient location during evaluation: Short Stay Anesthesia Type: General Level of consciousness: awake and alert Pain management: pain level controlled Vital Signs Assessment: post-procedure vital signs reviewed and stable Respiratory status: spontaneous breathing, nonlabored ventilation and respiratory function stable Cardiovascular status: blood pressure returned to baseline and stable Postop Assessment: no apparent nausea or vomiting Anesthetic complications: no  There were no known notable events for this encounter.  Last Vitals:  Vitals:   09/15/23 1230 09/15/23 1245  BP: 121/70 133/68  Pulse: 73 75  Resp: 14 17  Temp:    SpO2: 100% 100%    Last Pain:  Vitals:   09/15/23 1207  TempSrc:   PainSc: 0-No pain                 Quinntin Malter,W. EDMOND

## 2023-09-15 NOTE — Transfer of Care (Signed)
 Immediate Anesthesia Transfer of Care Note  Patient: EMALEIGH GUIMOND  Procedure(s) Performed: ATRIAL FIBRILLATION ABLATION  Patient Location: PACU and Cath Lab  Anesthesia Type:General  Level of Consciousness: drowsy and responds to stimulation  Airway & Oxygen Therapy: Patient Spontanous Breathing and Patient connected to face mask oxygen  Post-op Assessment: Report given to RN and Post -op Vital signs reviewed and stable  Post vital signs: Reviewed and stable  Last Vitals:  Vitals Value Taken Time  BP 132/64 09/15/23 1107  Temp    Pulse 77 09/15/23 1115  Resp 9 09/15/23 1114  SpO2 100 % 09/15/23 1115  Vitals shown include unfiled device data.  Last Pain:  Vitals:   09/15/23 0659  TempSrc:   PainSc: 4       Patients Stated Pain Goal: 2 (09/15/23 1610)  Complications: There were no known notable events for this encounter.

## 2023-09-16 ENCOUNTER — Telehealth (HOSPITAL_COMMUNITY): Payer: Self-pay

## 2023-09-16 NOTE — Telephone Encounter (Signed)
 Spoke with patient to complete post procedure follow up call.  Patient reports no complications with groin sites.   Instructions reviewed with patient:  Remove large bandage at puncture site after 24 hours. It is normal to have bruising, tenderness and a pea or marble sized lump/knot at the groin site which can take up to three months to resolve.  Get help right away if you notice sudden swelling at the puncture site.  Check your puncture site every day for signs of infection: fever, redness, swelling, pus drainage, warmth, foul odor or excessive pain. If this occurs, please call the office at [phone], to speak with the nurse. Get help right away if your puncture site is bleeding and the bleeding does not stop after applying firm pressure to the area.  You may continue to have skipped beats/ atrial fibrillation during the first several months after your procedure.  It is very important not to miss any doses of your blood thinner Warfarin. Patient restarted taking this medication on yesterday, 09/15/23.   You will follow up with the Afib clinic on 10/13/23 and follow up with the APP on 12/13/23.   Patient verbalized understanding to all instructions provided.

## 2023-09-19 MED FILL — Fentanyl Citrate Preservative Free (PF) Inj 100 MCG/2ML: INTRAMUSCULAR | Qty: 2 | Status: AC

## 2023-09-21 ENCOUNTER — Ambulatory Visit
Admission: RE | Admit: 2023-09-21 | Discharge: 2023-09-21 | Disposition: A | Discharge status: Home / Self Care | Payer: Medicare Other | Source: Ambulatory Visit | Attending: Family Medicine | Admitting: Family Medicine

## 2023-09-21 DIAGNOSIS — Z1231 Encounter for screening mammogram for malignant neoplasm of breast: Secondary | ICD-10-CM

## 2023-09-22 ENCOUNTER — Telehealth: Payer: Self-pay | Admitting: Cardiovascular Disease

## 2023-09-22 NOTE — Telephone Encounter (Signed)
 Patient calling in for numbness in left hand, arm, and leg. She feels some numbness in right but not as much as left side. Has some blurry going on in her eyes. Also, she states that she has drop several things today unable to grip it. Please advise

## 2023-09-22 NOTE — Telephone Encounter (Signed)
 Attempted to call patient, no answer left message requesting a call back.

## 2023-09-23 ENCOUNTER — Encounter: Payer: Self-pay | Admitting: Emergency Medicine

## 2023-09-23 NOTE — Telephone Encounter (Signed)
 2nd attempt to call patient, no answer left message requesting a call back.

## 2023-09-24 NOTE — Telephone Encounter (Signed)
 3rd attempt to call patient, no answer, left message requesting a call back Nursing will await for patient to return call

## 2023-10-13 ENCOUNTER — Ambulatory Visit (HOSPITAL_COMMUNITY)
Admission: RE | Admit: 2023-10-13 | Discharge: 2023-10-13 | Disposition: A | Discharge status: Home / Self Care | Payer: Medicare Other | Source: Ambulatory Visit | Attending: Internal Medicine | Admitting: Internal Medicine

## 2023-10-13 VITALS — BP 162/80 | HR 71 | Ht 60.0 in | Wt 169.4 lb

## 2023-10-13 DIAGNOSIS — I4510 Unspecified right bundle-branch block: Secondary | ICD-10-CM | POA: Insufficient documentation

## 2023-10-13 DIAGNOSIS — I4819 Other persistent atrial fibrillation: Secondary | ICD-10-CM | POA: Insufficient documentation

## 2023-10-13 DIAGNOSIS — I4892 Unspecified atrial flutter: Secondary | ICD-10-CM | POA: Diagnosis not present

## 2023-10-13 DIAGNOSIS — Z79899 Other long term (current) drug therapy: Secondary | ICD-10-CM | POA: Insufficient documentation

## 2023-10-13 DIAGNOSIS — Z5181 Encounter for therapeutic drug level monitoring: Secondary | ICD-10-CM | POA: Diagnosis not present

## 2023-10-13 DIAGNOSIS — D6869 Other thrombophilia: Secondary | ICD-10-CM | POA: Insufficient documentation

## 2023-10-13 DIAGNOSIS — Z6833 Body mass index (BMI) 33.0-33.9, adult: Secondary | ICD-10-CM | POA: Insufficient documentation

## 2023-10-13 DIAGNOSIS — I10 Essential (primary) hypertension: Secondary | ICD-10-CM | POA: Insufficient documentation

## 2023-10-13 DIAGNOSIS — E669 Obesity, unspecified: Secondary | ICD-10-CM | POA: Insufficient documentation

## 2023-10-13 DIAGNOSIS — Z7901 Long term (current) use of anticoagulants: Secondary | ICD-10-CM | POA: Insufficient documentation

## 2023-10-13 DIAGNOSIS — I082 Rheumatic disorders of both aortic and tricuspid valves: Secondary | ICD-10-CM | POA: Diagnosis not present

## 2023-10-13 LAB — MAGNESIUM: Magnesium: 1.9 mg/dL (ref 1.7–2.4)

## 2023-10-13 NOTE — Progress Notes (Signed)
 Primary Care Physician: Kaleen Mask, MD Referring Physician: Dr. Allyson Sabal Primary EP: Dr Nelly Laurence    Julia Rose is a 78 y.o. 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 She was referred by Dr. Allyson Sabal for further evaluation 07/12/15.  The afib was paroxysmal at that time. She had been started on edoxaban 30 mg daily for a chadsvasc score of 4. She was aware of fatigue, shortness of breath and chest heaviness when in afib. 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   Returned to afib clinic 09/2016,  after seeing Dr. Allyson Sabal, and pt feeling she has been in persistent afib since  fall. For the most part she is minimally symptomatic. She walks with her daughter when the weather is nice 30 mins to an hour and can do so with minimal symptoms. She had a repeat ECHO with normal EF and moderate aortic stenosis which she was told was  stable. She was asked to be seen here to discuss antiarrythmic therapy. 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 Continues warfarin for a CHA2DS2VASc score of at least 3. She deferred any change in therapy due to up coming trip.   Returned to afib clinic 10/28/16, and wishes to further discuss restoring SR. When she went on her trip, she noticed how short of breath with activity she had become since the trip last year. People in her group were always waiting for her to catch up with them.She reports that when she makes the bed, she has to rest half way in between. Financial concerns would definitely play into what antiarrythmic she would be able to afford. After discussion of available antiarrythmic's, she was not ready to start AAD but was interested in pursuing cardioversion alone to see if she could return to SR and if her symptoms would improve in SR. She also has moderate aortic stenosis that may be playing a part in he symptoms, but by echo Dr. Allyson Sabal told her this is stable.. She is on warfarin  and has had 3 therapeutic warfarin levels checked  with PCP, 5/2 - 2.4, 5/10 - 2.1, 5/16 - 2.3 and INR pending this am with pre procedure labs. Pt aware of risk vrs benefit of procedure and wants to proceed.  F/u afib clinic 5/30 f/u cardioversion. The cardioversion was successful but unfortunately she did return to afib after 3 days. It was long enough for her to notice that she had more energy and less exertional dyspnea. This has made her more interested in pursing SR using an antiarrythmic which she was against before.  F/u 9/20 from hospitalization for sotalol, fortunately, pt did convert with drug and did not require cardioversion. She does have less shortness of breath in SR and more energy. She has done some outdoor walking and deep cleaning at her house which would not have been possible in the past year in afib. She is happy re results.  F/u in afib clinic, 1/15- She continues in SR and continues to experience less shortness of breath. She does have ongoing fatigue, but improved in SR.Marland Kitchen She has refused to have sleep study in the past.   F/u in afib clinic 09/21/17, at the request of Dr. Allyson Sabal, from finding afib at his recent office visit. Today pt is back in SR. She states that she had more stress lately as her brother had open heart surgery in [place] and she went up to  stay with him. She has had her echo repeated recently and fairly stable with  mod aortic stenosis, diastolic dysfunction.  F/u afib clinic, 5/8, she reports very little breakthrough afib and it is short lived. Happy with sotalol management. Echo for moderate AS is pending 08/2018.  F/u in fib clinic, 11/8. She is in SR and feels well. No issues with meds. Has had to stop walking for arthritic changes in her back and hips. She is interested in the [facility]  [employer] 3 month supervised exercise program.  On follow up today, 12/01/22. She is currently in rate controlled Afib. Seen by Dr. Allyson Sabal on 11/17/22 and noted to be in rate controlled  Afib at that time. She has an Scientist, physiological she wears to monitor her rhythm since the beginning of the year. She did not bring it in today because it was charging. She notes that last week she was in Afib 100% of the time according to the watch; she was in Afib 65% of the time 2 weeks ago. She feels tired. No missed doses of sotalol or coumadin.   On follow up 01/07/23, she is currently in rate controlled atrial flutter. She was scheduled for DCCV on 6/3 but cancelled due to arriving in NSR. Since that day, she has gone in and out of abnormal rhythm. She shows me on her phone Apple watch data over the past few months noting periodically 100% Afib burden. She had 100% burden for the majority of May and right before scheduled cardioversion was back in normal rhythm. Currently, she feels asymptomatic at this time.     Follow up in the AF clinic 04/06/23. Patient presents today for dofetilide admission. She has discontinued citalopram. She remains in rate controlled atypical atrial flutter today with symptoms of fatigue.   F/u in Afib clinic, 04/16/23. Patient is currently in atrial flutter. S/p Tikosyn admission 9/10-13. She converted chemically to NSR and did not require cardioversion. Patient's QT interval prolonged and dose reduction was indicated. She is currently on Tikosyn 250 mcg BID. No missed doses of Tikosyn. She notes since being home she is still paroxysmal per symptoms and Apple watch about 50% of the time. She shows me HR 60-70s at home. She is trying to increase her exercise and walking about 3/4 of a mile daily. No bleeding issues on coumadin. Patient tells me she has been taking her previous OTC magnesium pill which is 250 mg. She has not picked up new prescription of magnesium 400 mg daily.   F/u in Afib clinic, 05/14/23. Patient is currently in NSR. No missed doses of Tikosyn 250 mcg BID. She shows me on her phone data of past month showing half of the time she was in Afib and half of the time she  was in normal rhythm. She admits to not being very active and when in Afib it makes her tired more than usual.   On follow up 10/13/23, patient is currently in NSR. S/p Afib ablation on 09/15/23 by Dr. Nelly Laurence. No episodes of Afib since ablation. She is taking Tikosyn 250 mcg BID. No chest pain or SOB. Leg sites healed without issue. No missed doses of anticoagulant. She shows me home BP recordings that are typically 130/70s.  Today, she denies symptoms of orthopnea, PND, lower extremity edema, dizziness, presyncope, syncope, snoring, daytime somnolence, bleeding, or neurologic sequela. The patient is tolerating medications without difficulties and is otherwise without complaint today.    Past Medical History:  Diagnosis Date   Arthritis    "  hands" (04/06/2017)   Asthma    mild with exertion   Atrial fibrillation with RVR (HCC)    Chronic back pain    "from the DDD"   DDD (degenerative disc disease), cervical    C-5-6 and C6-7 are partially herniated   DDD (degenerative disc disease), lumbar    L4-5 and L5-6  and S-1 are herniated discs   DDD (degenerative disc disease), thoracic    "not dx'd yet" (04/06/2017)   Hyperlipidemia    statin intolerant   Hypertension    Moderate aortic stenosis    Right bundle branch block    Seizures (HCC)    "when I was younger; none since the 21s" (04/06/2017)   Type II diabetes mellitus (HCC)     Current Outpatient Medications  Medication Sig Dispense Refill   acetaminophen (TYLENOL 8 HOUR ARTHRITIS PAIN) 650 MG CR tablet Take 650 mg by mouth 3 (three) times daily. Increase if needed     albuterol (PROVENTIL HFA;VENTOLIN HFA) 108 (90 Base) MCG/ACT inhaler Inhale 2 puffs into the lungs every 6 (six) hours as needed for wheezing or shortness of breath.     amLODipine (NORVASC) 2.5 MG tablet TAKE 1 TABLET BY MOUTH DAILY 90 tablet 3   atorvastatin (LIPITOR) 40 MG tablet Take 1 tablet (40 mg total) by mouth daily. 90 tablet 3   dofetilide (TIKOSYN) 250  MCG capsule TAKE 1 CAPSULE BY MOUTH TWICE DAILY 60 capsule 6   ibuprofen (ADVIL) 200 MG tablet Take 400 mg by mouth daily as needed for moderate pain (pain score 4-6). Taking 1-4 tablets by mouth as needed     lidocaine (LMX) 4 % cream Apply 1 Application topically daily as needed (Pain).     losartan (COZAAR) 25 MG tablet Take 25 mg by mouth daily.     magnesium oxide (MAG-OX) 400 (240 Mg) MG tablet Take 1 tablet (400 mg total) by mouth in the morning. 30 tablet 6   metFORMIN (GLUCOPHAGE) 1000 MG tablet Take 500-1,000 mg by mouth See admin instructions. Taking 1000 mg in the am and 500mg  in the evening with meals     Nutritional Supplements (JUICE PLUS FIBRE PO) Take 3 capsules by mouth in the morning and at bedtime. Fruit, Berry and Veg.     oxybutynin (DITROPAN-XL) 5 MG 24 hr tablet Take 5 mg by mouth at bedtime.     PHENObarbital (LUMINAL) 100 MG tablet Take 100 mg by mouth at bedtime.  4   polyvinyl alcohol (LIQUIFILM TEARS) 1.4 % ophthalmic solution Place 1 drop into both eyes 2 (two) times daily. Artificial tears     potassium chloride SA (KLOR-CON M) 10 MEQ tablet Take 2 tablets (20 mEq total) by mouth daily. 60 tablet 6   RYBELSUS 7 MG TABS Take 1 tablet by mouth daily.     Simethicone 180 MG CAPS Take 180 mg by mouth daily as needed (for gas).     warfarin (COUMADIN) 5 MG tablet Take 1 tablet (5 mg total) by mouth daily. Taking 7.5mg  on Tuesday, Wednesday, and 10 mg on Thursday (Patient taking differently: Take 7.5-10 mg by mouth See admin instructions. Taking 10 mg 1st day and take 7.5 gm second and third day  back to the 10 mg alternating in the evening)     No current facility-administered medications for this encounter.    ROS- All systems are reviewed and negative except as per the HPI above  Physical Exam: Vitals:   10/13/23 1005  BP: Marland Kitchen)  162/80  Pulse: 71  Weight: 76.8 kg  Height: 5' (1.524 m)    GEN- The patient is well appearing, alert and oriented x 3 today.   Neck -  no JVD or carotid bruit noted Lungs- Clear to ausculation bilaterally, normal work of breathing Heart- Regular rate and rhythm, no murmurs, rubs or gallops, PMI not laterally displaced Extremities- no clubbing, cyanosis, or edema Skin - no rash or ecchymosis noted   EKG today demonstrates Vent. rate 71 BPM PR interval 158 ms QRS duration 134 ms QT/QTcB 450/489 ms P-R-T axes 3 97 40 Normal sinus rhythm Right bundle branch block Abnormal ECG When compared with ECG of 15-Sep-2023 11:11, PREVIOUS ECG IS PRESENT  Echo-09/08/23:  1. Left ventricular ejection fraction, by estimation, is 60 to 65%. Left  ventricular ejection fraction by 3D volume is 60 %. The left ventricle has  normal function. The left ventricle has no regional wall motion  abnormalities. Left ventricular diastolic   parameters are consistent with Grade III diastolic dysfunction  (restrictive).   2. Right ventricular systolic function is mildly reduced. The right  ventricular size is mildly enlarged. There is normal pulmonary artery  systolic pressure. The estimated right ventricular systolic pressure is  28.4 mmHg.   3. Left atrial size was mildly dilated.   4. The mitral valve is grossly normal. Trivial mitral valve  regurgitation. No evidence of mitral stenosis.   5. Tricuspid valve regurgitation is mild to moderate.   6. The aortic valve is tricuspid. There is moderate calcification of the  aortic valve. There is moderate thickening of the aortic valve. Aortic  valve regurgitation is mild. Moderate aortic valve stenosis. Aortic valve  area, by VTI measures 1.02 cm.  Aortic valve mean gradient measures 18.0 mmHg. Aortic valve Vmax measures  2.81 m/s.   7. The inferior vena cava is normal in size with greater than 50%  respiratory variability, suggesting right atrial pressure of 3 mmHg.   CHA2DS2-VASc Score = 4  The patient's score is based upon: CHF History: 0 HTN History: 1 Diabetes History: 0 Stroke  History: 0 Vascular Disease History: 0 Age Score: 2 Gender Score: 1      ASSESSMENT AND PLAN: Persistent Atrial Fibrillation/atrial flutter The patient's CHA2DS2-VASc score is 4, indicating a 4.8% annual risk of stroke.   Previously failed sotalol. S/p Tikosyn admission 9/10-13/24. S/p Afib ablation on 09/15/23 by Dr. Nelly Laurence.  She is currently in NSR.  High risk medication monitoring (ICD10: R7229428) Patient requires ongoing monitoring for anti-arrhythmic medication which has the potential to cause life threatening arrhythmias or AV block. Qtc stable. Continue Tikosyn 250 mcg BID. Mag drawn today.  Secondary Hypercoagulable State (ICD10:  D68.69) The patient is at significant risk for stroke/thromboembolism based upon her CHA2DS2-VASc Score of 4.  Continue Warfarin (Coumadin).  Continue coumadin as directed.  Obesity Body mass index is 33.08 kg/m.  Encouraged daily walking.  HTN Elevated in office but good readings at home. Continue to monitor.  VHD Moderate AS Followed by Dr Allyson Sabal with annual echocardiograms.   Follow up with EP as scheduled.    Justin Mend, PA-C [facility] [address] [place], [place] [address] [phone]

## 2023-10-14 NOTE — Addendum Note (Signed)
 Encounter addended by: Eustace Pen, PA-C on: 10/14/2023 8:46 AM  Actions taken: Clinical Note Signed

## 2023-10-28 ENCOUNTER — Ambulatory Visit: Payer: Medicare Other | Admitting: Physician Assistant

## 2023-10-28 ENCOUNTER — Other Ambulatory Visit: Payer: Self-pay | Admitting: Cardiovascular Disease

## 2023-10-28 LAB — HEPATIC FUNCTION PANEL
ALT: 24 IU/L (ref 0–32)
AST: 28 IU/L (ref 0–40)
Albumin: 4.7 g/dL (ref 3.8–4.8)
Alkaline Phosphatase: 68 IU/L (ref 44–121)
Bilirubin Total: 0.4 mg/dL (ref 0.0–1.2)
Bilirubin, Direct: 0.18 mg/dL (ref 0.00–0.40)
Total Protein: 7 g/dL (ref 6.0–8.5)

## 2023-10-28 LAB — LIPID PANEL
Chol/HDL Ratio: 3 ratio (ref 0.0–4.4)
Cholesterol, Total: 142 mg/dL (ref 100–199)
HDL: 47 mg/dL (ref >39)
LDL Chol Calc (NIH): 76 mg/dL (ref 0–99)
Triglycerides: 102 mg/dL (ref 0–149)
VLDL Cholesterol Cal: 19 mg/dL (ref 5–40)

## 2023-10-29 ENCOUNTER — Encounter (HOSPITAL_BASED_OUTPATIENT_CLINIC_OR_DEPARTMENT_OTHER): Payer: Self-pay

## 2023-11-01 ENCOUNTER — Other Ambulatory Visit: Payer: Self-pay | Admitting: Cardiovascular Disease

## 2023-11-02 ENCOUNTER — Ambulatory Visit: Payer: Medicare Other | Admitting: Physician Assistant

## 2023-11-26 ENCOUNTER — Encounter (HOSPITAL_COMMUNITY): Payer: Self-pay | Admitting: Emergency Medicine

## 2023-11-26 ENCOUNTER — Emergency Department (HOSPITAL_COMMUNITY)
Admission: EM | Admit: 2023-11-26 | Discharge: 2023-11-26 | Disposition: A | Discharge status: Home / Self Care | Attending: Emergency Medicine | Admitting: Emergency Medicine

## 2023-11-26 ENCOUNTER — Emergency Department (HOSPITAL_COMMUNITY)

## 2023-11-26 ENCOUNTER — Other Ambulatory Visit: Payer: Self-pay

## 2023-11-26 DIAGNOSIS — R42 Dizziness and giddiness: Secondary | ICD-10-CM | POA: Diagnosis present

## 2023-11-26 DIAGNOSIS — I10 Essential (primary) hypertension: Secondary | ICD-10-CM | POA: Diagnosis not present

## 2023-11-26 DIAGNOSIS — Z7901 Long term (current) use of anticoagulants: Secondary | ICD-10-CM | POA: Insufficient documentation

## 2023-11-26 DIAGNOSIS — I4891 Unspecified atrial fibrillation: Secondary | ICD-10-CM | POA: Insufficient documentation

## 2023-11-26 DIAGNOSIS — E119 Type 2 diabetes mellitus without complications: Secondary | ICD-10-CM | POA: Diagnosis not present

## 2023-11-26 DIAGNOSIS — Z79899 Other long term (current) drug therapy: Secondary | ICD-10-CM | POA: Insufficient documentation

## 2023-11-26 DIAGNOSIS — Z7984 Long term (current) use of oral hypoglycemic drugs: Secondary | ICD-10-CM | POA: Diagnosis not present

## 2023-11-26 LAB — CBC WITH DIFFERENTIAL/PLATELET
Abs Immature Granulocytes: 0.03 10*3/uL (ref 0.00–0.07)
Basophils Absolute: 0.1 10*3/uL (ref 0.0–0.1)
Basophils Relative: 1 %
Eosinophils Absolute: 0.1 10*3/uL (ref 0.0–0.5)
Eosinophils Relative: 2 %
HCT: 41.3 % (ref 36.0–46.0)
Hemoglobin: 14 g/dL (ref 12.0–15.0)
Immature Granulocytes: 0 %
Lymphocytes Relative: 20 %
Lymphs Abs: 1.8 10*3/uL (ref 0.7–4.0)
MCH: 31.6 pg (ref 26.0–34.0)
MCHC: 33.9 g/dL (ref 30.0–36.0)
MCV: 93.2 fL (ref 80.0–100.0)
Monocytes Absolute: 0.5 10*3/uL (ref 0.1–1.0)
Monocytes Relative: 6 %
Neutro Abs: 6.6 10*3/uL (ref 1.7–7.7)
Neutrophils Relative %: 71 %
Platelets: 280 10*3/uL (ref 150–400)
RBC: 4.43 MIL/uL (ref 3.87–5.11)
RDW: 12.1 % (ref 11.5–15.5)
WBC: 9.2 10*3/uL (ref 4.0–10.5)
nRBC: 0 % (ref 0.0–0.2)

## 2023-11-26 LAB — URINALYSIS, W/ REFLEX TO CULTURE (INFECTION SUSPECTED)
Bacteria, UA: NONE SEEN
Bilirubin Urine: NEGATIVE
Glucose, UA: NEGATIVE mg/dL
Hgb urine dipstick: NEGATIVE
Ketones, ur: 5 mg/dL — AB
Leukocytes,Ua: NEGATIVE
Nitrite: NEGATIVE
Protein, ur: NEGATIVE mg/dL
Specific Gravity, Urine: 1.01 (ref 1.005–1.030)
pH: 8 (ref 5.0–8.0)

## 2023-11-26 LAB — BASIC METABOLIC PANEL WITH GFR
Anion gap: 13 (ref 5–15)
BUN: 8 mg/dL (ref 8–23)
CO2: 24 mmol/L (ref 22–32)
Calcium: 9.6 mg/dL (ref 8.9–10.3)
Chloride: 104 mmol/L (ref 98–111)
Creatinine, Ser: 0.72 mg/dL (ref 0.44–1.00)
GFR, Estimated: >60 mL/min (ref >60)
Glucose, Bld: 161 mg/dL — ABNORMAL HIGH (ref 70–99)
Potassium: 3.9 mmol/L (ref 3.5–5.1)
Sodium: 141 mmol/L (ref 135–145)

## 2023-11-26 LAB — PROTIME-INR
INR: 2.1 — ABNORMAL HIGH (ref 0.8–1.2)
Prothrombin Time: 23.4 s — ABNORMAL HIGH (ref 11.4–15.2)

## 2023-11-26 MED ORDER — MECLIZINE HCL 25 MG PO TABS: 25.0000 mg | ORAL_TABLET | Freq: Three times a day (TID) | ORAL | 0 refills | Status: DC | PRN

## 2023-11-26 MED ORDER — ONDANSETRON 4 MG PO TBDP: ORAL_TABLET | ORAL | 0 refills | Status: DC

## 2023-11-26 MED ORDER — MECLIZINE HCL 25 MG PO TABS
25.0000 mg | ORAL_TABLET | Freq: Once | ORAL | Status: AC
  Administered 2023-11-26: 25 mg via ORAL
  Filled 2023-11-26: qty 1

## 2023-11-26 MED ORDER — SODIUM CHLORIDE 0.9 % IV BOLUS
500.0000 mL | Freq: Once | INTRAVENOUS | Status: AC
  Administered 2023-11-26: 500 mL via INTRAVENOUS

## 2023-11-26 MED ORDER — ONDANSETRON HCL 4 MG/2ML IJ SOLN
4.0000 mg | Freq: Once | INTRAMUSCULAR | Status: AC
  Administered 2023-11-26: 4 mg via INTRAVENOUS
  Filled 2023-11-26: qty 2

## 2023-11-26 NOTE — ED Provider Notes (Signed)
 [place] EMERGENCY DEPARTMENT AT [facility] Provider Note   CSN: [id number] Arrival date & time: 11/26/23  1918     History  No chief complaint on file.   Julia Rose is a 78 y.o. female.  Patient is a 78 year old female who presents with dizziness.  She has a history of atrial fibrillation on Coumadin , aortic stenosis, diabetes, hypertension, hyperlipidemia.  She states that she was working outside during the day.  She started to get dizzy and sat down.  She walked inside and lay down.  She was having hard time walking due to feeling off balance.  She then started having feeling that everything was moving.  She closed her eyes and felt like everything was spinning.  She had associated nausea and vomiting.  She has some photosensitivity but denies any headache.  She said she had vertigo once before in 2017 but this is worse and a bit different.  She denies any fevers or other recent illnesses.  No numbness or weakness to her extremities.  No blind spots or double vision.  No difficulty with her speech.       Home Medications Prior to Admission medications   Medication Sig Start Date End Date Taking? Authorizing Provider  meclizine  (ANTIVERT ) 25 MG tablet Take 1 tablet (25 mg total) by mouth 3 (three) times daily as needed for dizziness. 11/26/23  Yes Hershel Los, MD  ondansetron  (ZOFRAN -ODT) 4 MG disintegrating tablet 4mg  ODT q4 hours prn nausea/vomit 11/26/23  Yes Hershel Los, MD  acetaminophen  (TYLENOL  8 HOUR ARTHRITIS PAIN) 650 MG CR tablet Take 650 mg by mouth 3 (three) times daily. Increase if needed    [provider]  albuterol  (PROVENTIL  HFA;VENTOLIN  HFA) 108 (90 Base) MCG/ACT inhaler Inhale 2 puffs into the lungs every 6 (six) hours as needed for wheezing or shortness of breath.    [provider]  amLODipine  (NORVASC ) 2.5 MG tablet TAKE 1 TABLET BY MOUTH DAILY 11/02/23   Avanell Leigh, MD  atorvastatin  (LIPITOR) 40 MG tablet Take 1 tablet  (40 mg total) by mouth daily. 07/30/23 10/28/23  Avanell Leigh, MD  dofetilide  (TIKOSYN ) 250 MCG capsule TAKE 1 CAPSULE BY MOUTH TWICE DAILY 07/19/23   Nathanel Bal, PA-C  ibuprofen (ADVIL) 200 MG tablet Take 400 mg by mouth daily as needed for moderate pain (pain score 4-6). Taking 1-4 tablets by mouth as needed    [provider]  lidocaine  (LMX) 4 % cream Apply 1 Application topically daily as needed (Pain).    [provider]  losartan  (COZAAR ) 25 MG tablet Take 25 mg by mouth daily. 04/26/23   [provider]  magnesium  oxide (MAG-OX) 400 (240 Mg) MG tablet Take 1 tablet (400 mg total) by mouth in the morning. 04/16/23   Nathanel Bal, PA-C  metFORMIN  (GLUCOPHAGE ) 1000 MG tablet Take 500-1,000 mg by mouth See admin instructions. Taking 1000 mg in the am and 500mg  in the evening with meals    [provider]  Nutritional Supplements (JUICE PLUS FIBRE PO) Take 3 capsules by mouth in the morning and at bedtime. Fruit, Berry and [employer].    [provider]  oxybutynin  (DITROPAN -XL) 5 MG 24 hr tablet Take 5 mg by mouth at bedtime. 01/18/23   [provider]  PHENObarbital  (LUMINAL) 100 MG tablet Take 100 mg by mouth at bedtime. 06/13/15   [provider]  polyvinyl alcohol  (LIQUIFILM TEARS) 1.4 % ophthalmic solution Place 1 drop into both eyes  2 (two) times daily. Artificial tears    [provider]  potassium chloride  SA (KLOR-CON  M) 10 MEQ tablet Take 2 tablets (20 mEq total) by mouth daily. 06/10/23   Nathanel Bal, PA-C  RYBELSUS  7 MG TABS Take 1 tablet by mouth daily. 10/06/23   [provider]  Simethicone  180 MG CAPS Take 180 mg by mouth daily as needed (for gas).    [provider]  warfarin (COUMADIN ) 5 MG tablet Take 1 tablet (5 mg total) by mouth daily. Taking 7.5mg  on Tuesday, Wednesday, and 10 mg on Thursday Patient taking differently: Take 7.5-10 mg by mouth See admin instructions. Taking 10 mg  1st day and take 7.5 gm second and third day  back to the 10 mg alternating in the evening 04/09/23   Thomasena Fleming, NP      Allergies    Dilantin [phenytoin] and Statins    Review of Systems   Review of Systems  Constitutional:  Negative for chills, diaphoresis, fatigue and fever.  HENT:  Negative for congestion, rhinorrhea and sneezing.   Eyes: Negative.   Respiratory:  Negative for cough, chest tightness and shortness of breath.   Cardiovascular:  Negative for chest pain and leg swelling.  Gastrointestinal:  Positive for nausea and vomiting. Negative for abdominal pain, blood in stool and diarrhea.  Genitourinary:  Negative for difficulty urinating, flank pain, frequency and hematuria.  Musculoskeletal:  Negative for arthralgias and back pain.  Skin:  Negative for rash.  Neurological:  Positive for dizziness. Negative for speech difficulty, weakness, numbness and headaches.    Physical Exam Updated Vital Signs BP (!) 154/68   Pulse 81   Temp 98.8 F (37.1 C) (Oral)   Resp 16   Ht 5' (1.524 m)   Wt 73.5 kg   SpO2 91%   BMI 31.64 kg/m  Physical Exam Constitutional:      Appearance: She is well-developed.  HENT:     Head: Normocephalic and atraumatic.  Eyes:     Pupils: Pupils are equal, round, and reactive to light.  Cardiovascular:     Rate and Rhythm: Normal rate and regular rhythm.     Heart sounds: Murmur heard.  Pulmonary:     Effort: Pulmonary effort is normal. No respiratory distress.     Breath sounds: Normal breath sounds. No wheezing or rales.  Chest:     Chest wall: No tenderness.  Abdominal:     General: Bowel sounds are normal.     Palpations: Abdomen is soft.     Tenderness: There is no abdominal tenderness. There is no guarding or rebound.  Musculoskeletal:        General: Normal range of motion.     Cervical back: Normal range of motion and neck supple.  Lymphadenopathy:     Cervical: No cervical adenopathy.  Skin:    General: Skin is warm  and dry.     Findings: No rash.  Neurological:     Mental Status: She is alert and oriented to person, place, and time.     Comments: Motor 5/5 all extremities Sensation grossly intact to LT all extremities Finger to Nose intact, no pronator drift CN II-XII grossly intact Visual fields full to confrontation      ED Results / Procedures / Treatments   Labs (all labs ordered are listed, but only abnormal results are displayed) Labs Reviewed  BASIC METABOLIC PANEL WITH GFR - Abnormal; Notable for the following components:  Result Value   Glucose, Bld 161 (*)    All other components within normal limits  URINALYSIS, W/ REFLEX TO CULTURE (INFECTION SUSPECTED) - Abnormal; Notable for the following components:   Ketones, ur 5 (*)    All other components within normal limits  PROTIME-INR - Abnormal; Notable for the following components:   Prothrombin Time 23.4 (*)    INR 2.1 (*)    All other components within normal limits  CBC WITH DIFFERENTIAL/PLATELET    EKG None  Radiology MR BRAIN WO CONTRAST Result Date: 11/26/2023 CLINICAL DATA:  Headache, neuro deficit vertigo EXAM: MRI HEAD WITHOUT CONTRAST TECHNIQUE: Multiplanar, multiecho pulse sequences of the brain and surrounding structures were obtained without intravenous contrast. COMPARISON:  MRI head 01/26/2023. FINDINGS: Brain: No acute infarction, hemorrhage, hydrocephalus, extra-axial collection or mass lesion. Vascular: Major arterial flow voids are maintained at the skull base. Skull and upper cervical spine: Normal marrow signal. Sinuses/Orbits: Mostly clear sinuses.  No acute orbital findings. IMPRESSION: No evidence of acute intracranial abnormality. Electronically Signed   By: Stevenson Elbe M.D.   On: 11/26/2023 21:37    Procedures Procedures    Medications Ordered in ED Medications  sodium chloride  0.9 % bolus 500 mL (500 mLs Intravenous New Bag/Given 11/26/23 2010)  ondansetron  (ZOFRAN ) injection 4 mg (4 mg  Intravenous Given 11/26/23 2012)  meclizine  (ANTIVERT ) tablet 25 mg (25 mg Oral Given 11/26/23 2010)    ED Course/ Medical Decision Making/ A&P                                 Medical Decision Making Problems Addressed: Vertigo: acute illness or injury  Amount and/or Complexity of Data Reviewed External Data Reviewed: notes. Labs: ordered. Decision-making details documented in ED Course. Radiology: ordered. Decision-making details documented in ED Course. ECG/medicine tests: ordered and independent interpretation performed. Decision-making details documented in ED Course.  Risk Prescription drug management. Decision regarding hospitalization.   Patient is having 78 year old who presents with dizziness.  Her symptoms sound consistent with vertigo.  She does not have associated headache or neck pain.  No focal neurologic deficits.  Labs reviewed and are nonconcerning.  She does not have evidence of UTI or other infection.  She had an MRI which does not show any evidence of acute stroke.  She was given meclizine , IV fluids and Zofran .  She is feeling much better.  She is able to ambulate without ataxia.  She still has a little bit of ongoing nausea.  She was discharged home in good condition.  She was given prescription for meclizine  and Zofran .  She was encouraged to have close follow-up with her PCP.  Return precautions were given.  Final Clinical Impression(s) / ED Diagnoses Final diagnoses:  Vertigo    Rx / DC Orders ED Discharge Orders          Ordered    meclizine  (ANTIVERT ) 25 MG tablet  3 times daily PRN        11/26/23 2304    ondansetron  (ZOFRAN -ODT) 4 MG disintegrating tablet        11/26/23 2304              Hershel Los, MD 11/26/23 2307

## 2023-11-26 NOTE — ED Notes (Addendum)
 Pt ambulatory with no dizziness/nausea. Steady gait

## 2023-11-26 NOTE — ED Notes (Signed)
 Pt given water and bolus restarted after it was paused for MRI

## 2023-11-26 NOTE — ED Triage Notes (Signed)
 Per EMS, pt from home, was outside painting all day became dizzy, n/v.  Hx of vertigo but not usually this bad.  Negative stroke screen, A/O X4.  180/90 HR 90 91% RA,  CBG 159 20G L hand  4mg  zofran  which has relieved the n/v

## 2023-12-12 NOTE — Progress Notes (Addendum)
 Electrophysiology Office Note:   Date:  12/13/2023  ID:  Julia Rose, DOB 10-14-1945, MRN [id number]  Primary Cardiologist: Lauro Portal, MD Primary Heart Failure: None Electrophysiologist: Efraim Grange, MD      History of Present Illness:   Julia Rose is a 78 y.o. female with h/o AF, AFL, HTN, obesity, VHD seen today for routine electrophysiology follow-up s/p Ablation.  Since last being seen in our clinic the patient reports she has been doing so well. She has been doing things that she has not been able to do in years. She works in her yard all the time and has energy. She has not missed doses of Tikosyn .  She follows with the Coumadin  Clinic. No bleeding issues.   She denies chest pain, palpitations, dyspnea, PND, orthopnea, nausea, vomiting, dizziness, syncope, edema, weight gain, or early satiety.    Review of systems complete and found to be negative unless listed in HPI.   EP Information / Studies Reviewed:    EKG is ordered today. Personal review as below.  EKG Interpretation Date/Time:  Monday Dec 13 2023 13:52:39 EDT Ventricular Rate:  86 PR Interval:  136 QRS Duration:  138 QT Interval:  426 QTC Calculation: 509 R Axis:   92  Text Interpretation: Normal sinus rhythm Right bundle branch block Confirmed by Creighton Doffing ([id number]) on 12/13/2023 2:01:09 PM   Studies:  ECHO 08/2022 > LVEF 60-65%, no RWMA, RV systolic moderately reduced, mildly elevated PA systolic pressure  NM Cardiac PET 07/15/23 > normal study / low risk, EF 67%, moderate coronary artery calcifications CT Cardiac Morphology 07/19/23 > CAC score of 228 / 70th percentile for matched controls  ECHO 09/08/23 > LVEF 60-65%, GIII DD, LA mildly dilated, MV trivial regurgitation, moderate AS, TV regurgitation mild-moderate EPS 09/15/23 > SR on presentation, successful ablation of PV's with PF energy, successful ablation of posterior wall of LA with PF   Arrhythmia / AAD AF > dx ~ 2016  DCCV 11/2016 >  NSR with ERAF after 3d Sotalol  03/2018 loading > failed 03/2023 Planned DCCV 12/2022 but canceled on arrival due to NSR  Tikosyn  loading 03/2023 > 12/13/23 post ablation   Risk Assessment/Calculations:    CHA2DS2-VASc Score = 4   This indicates a 4.8% annual risk of stroke. The patient's score is based upon: CHF History: 0 HTN History: 1 Diabetes History: 0 Stroke History: 0 Vascular Disease History: 0 Age Score: 2 Gender Score: 1             Physical Exam:   VS:  BP 131/73   Pulse 86   Ht 5' (1.524 m)   Wt 164 lb (74.4 kg)   BMI 32.03 kg/m    Wt Readings from Last 3 Encounters:  12/13/23 164 lb (74.4 kg)  11/26/23 162 lb (73.5 kg)  10/13/23 169 lb 6.4 oz (76.8 kg)     GEN: Well nourished, well developed in no acute distress NECK: No JVD; No carotid bruits CARDIAC: Regular rate and rhythm, 3/6 SEM, rubs, gallops RESPIRATORY:  Clear to auscultation without rales, wheezing or rhonchi  ABDOMEN: Soft, non-tender, non-distended EXTREMITIES:  No edema; No deformity   ASSESSMENT AND PLAN:    Persistent Atrial Fibrillation  AFL  High Risk Medication Monitoring: Tikosyn   CHA2DS2-VASc 4, s/p ablation  -continue OAC for stroke prophylaxis  -EKG with NSR, RBBB  -stop Tikosyn , reviewed with Dr. Arlester Ladd in clinic. Patient understands that she could have recurrent AF and if needed would have to go  back in to hospital for re-admission process   -stop magnesium    Secondary Hypercoagulable State  -continue coumadin , follows in Coumadin  Clinic  -reviewed risk score with patient and indications for OAC   Hypertension  -well controlled on current regimen   CAD HLD  CAC score 228 / 70th percentile for matched controls  -per Cardiology   VHD: Moderate AS  -follows with Dr. Katheryne Pane   Follow up with Dr. Arlester Ladd or EP APP in 6 months  Signed, Creighton Doffing, NP-C, AGACNP-BC [facility] - Electrophysiology  12/13/2023, 2:27 PM

## 2023-12-13 ENCOUNTER — Ambulatory Visit: Payer: Medicare Other | Attending: Physician Assistant | Admitting: Pulmonary Disease

## 2023-12-13 ENCOUNTER — Encounter: Payer: Self-pay | Admitting: Pulmonary Disease

## 2023-12-13 VITALS — BP 131/73 | HR 86 | Ht 60.0 in | Wt 164.0 lb

## 2023-12-13 DIAGNOSIS — I10 Essential (primary) hypertension: Secondary | ICD-10-CM | POA: Diagnosis not present

## 2023-12-13 DIAGNOSIS — I4892 Unspecified atrial flutter: Secondary | ICD-10-CM | POA: Diagnosis not present

## 2023-12-13 DIAGNOSIS — D6869 Other thrombophilia: Secondary | ICD-10-CM | POA: Diagnosis not present

## 2023-12-13 DIAGNOSIS — I4819 Other persistent atrial fibrillation: Secondary | ICD-10-CM

## 2023-12-13 DIAGNOSIS — I350 Nonrheumatic aortic (valve) stenosis: Secondary | ICD-10-CM

## 2023-12-13 NOTE — Patient Instructions (Signed)
 Medication Instructions:  Stop your Tikosyn  & Magnesium     *If you need a refill on your cardiac medications before your next appointment, please call your pharmacy*  Lab Work: No lab work today If you have labs (blood work) drawn today and your tests are completely normal, you will receive your results only by: MyChart Message (if you have MyChart) OR A paper copy in the mail If you have any lab test that is abnormal or we need to change your treatment, we will call you to review the results.  Testing/Procedures: No testing/procedures were scheduled today  Follow-Up: At [facility], you and your health needs are our priority.  As part of our continuing mission to provide you with exceptional heart care, our providers are all part of one team.  This team includes your primary Cardiologist (physician) and Advanced Practice Providers or APPs (Physician Assistants and Nurse Practitioners) who all work together to provide you with the care you need, when you need it.  Your next appointment:   6 month(s)  Provider:   You will see the following Advanced Practice Providers on your designated Care Team:    Creighton Doffing, NP     We recommend signing up for the patient portal called "MyChart".  Sign up information is provided on this After Visit Summary.  MyChart is used to connect with patients for Virtual Visits (Telemedicine).  Patients are able to view lab/test results, encounter notes, upcoming appointments, etc.  Non-urgent messages can be sent to your provider as well.   To learn more about what you can do with MyChart, go to [email or web address].

## 2024-01-03 ENCOUNTER — Encounter: Payer: Self-pay | Admitting: Cardiovascular Disease

## 2024-01-03 ENCOUNTER — Ambulatory Visit: Attending: Cardiovascular Disease | Admitting: Cardiovascular Disease

## 2024-01-03 VITALS — BP 138/76 | HR 83 | Ht 60.0 in | Wt 165.4 lb

## 2024-01-03 DIAGNOSIS — I350 Nonrheumatic aortic (valve) stenosis: Secondary | ICD-10-CM | POA: Diagnosis not present

## 2024-01-03 DIAGNOSIS — I480 Paroxysmal atrial fibrillation: Secondary | ICD-10-CM | POA: Diagnosis not present

## 2024-01-03 DIAGNOSIS — R0989 Other specified symptoms and signs involving the circulatory and respiratory systems: Secondary | ICD-10-CM

## 2024-01-03 DIAGNOSIS — E785 Hyperlipidemia, unspecified: Secondary | ICD-10-CM | POA: Diagnosis not present

## 2024-01-03 DIAGNOSIS — I10 Essential (primary) hypertension: Secondary | ICD-10-CM | POA: Diagnosis not present

## 2024-01-03 DIAGNOSIS — R931 Abnormal findings on diagnostic imaging of heart and coronary circulation: Secondary | ICD-10-CM

## 2024-01-03 NOTE — Assessment & Plan Note (Signed)
 History of dyslipidemia on statin therapy with lipid profile performed/3/25 revealing total cholesterol of 142, LDL 76 and HDL 47.

## 2024-01-03 NOTE — Progress Notes (Signed)
 01/03/2024 Julia Rose   05-19-1946  [id number]  Primary Physician Candiss Chamorro, MD Primary Cardiologist: Avanell Leigh MD Bennye Bravo, [place]  HPI:  Julia Rose is a 78 y.o.    moderately overweight married Caucasian female mother of 4 children, grandmother of a grandchildren whose husband Autry Legions is also a patient of mine. She was referred by Dr. Emmy Harper for evaluation of recently recognized atrial fibrillation. I last saw her in the office 06/16/2023.  Her cardiac risk factor profile is notable for treated hypertension, diabetes. She has hyperlipidemia intolerant to statin drugs. She's never had a heart stroke. She occasionally gets chest tightness. 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 She was recently found to be in A. Fib during a routine doctor's visit and was referred here for further evaluation. 8. Event monitor showed A. Fib with heart rates as high as 170. 2-D echo revealed normal LV systolic function with mild to moderate aortic stenosis and a Myoview stress test was low risk and nonischemic. Carotid Doppler showed no evidence of ICA stenosis. She was placed on oral anticoagulation. Today she is in atrial fibrillation with a controlled ventricular response on Coumadin  anticoagulation. She underwent inpatient sotalol  loading in the fall of last year and saw Tera Fellows and the A. fib clinic last month at which time she was in sinus bradycardia. She is back in A. fib. I was unaware of this. Recent 2-D echocardiogram performed 08/31/17 revealed progression of her aortic stenosis with a valve area of 1.08 cm and a peak gradient of 36 mmHg. She did tell me that recently one of her brothers died of ischemic heart disease post valve replacement and bypass grafting and another brother had bypass grafting as well.   She was hospitalized in September 2020 for for Tikosyn  load and converted to sinus rhythm.  She is followed by Dr. Arlester Ladd.  She is on  warfarin oral anticoagulation.  She scheduled for A-fib ablation 08/16/2023.  She also has moderate aortic stenosis with echo performed 09/03/2022 revealing normal LV systolic function with an aortic valve area of 0.77 cm with a peak gradient of 32 mmHg.  She does complain of effort angina especially walking uphill.  She has no history of ischemic heart disease.  She did have a coronary calcium  score performed 06/18/2023 which was mildly elevated at 191.  She had a cardiac PET study performed 07/15/2023 which was low risk and nonischemic.  Since I saw her 7 months ago she did undergo successful A-fib ablation by Dr. Arlester Ladd 09/15/2023 and has felt clinically improved since.   Current Meds  Medication Sig   acetaminophen  (TYLENOL  8 HOUR ARTHRITIS PAIN) 650 MG CR tablet Take 650 mg by mouth 3 (three) times daily. Increase if needed   albuterol  (PROVENTIL  HFA;VENTOLIN  HFA) 108 (90 Base) MCG/ACT inhaler Inhale 2 puffs into the lungs every 6 (six) hours as needed for wheezing or shortness of breath.   amLODipine  (NORVASC ) 2.5 MG tablet TAKE 1 TABLET BY MOUTH DAILY   ibuprofen (ADVIL) 200 MG tablet Take 400 mg by mouth daily as needed for moderate pain (pain score 4-6). Taking 1-4 tablets by mouth as needed   lidocaine  (LMX) 4 % cream Apply 1 Application topically daily as needed (Pain).   losartan  (COZAAR ) 25 MG tablet Take 25 mg by mouth daily.   metFORMIN  (GLUCOPHAGE ) 1000 MG tablet Take 500-1,000 mg by mouth See admin instructions. Taking 1000 mg in the am and 500mg   in the evening with meals   Nutritional Supplements (JUICE PLUS FIBRE PO) Take 3 capsules by mouth in the morning and at bedtime. Fruit, Dally Oshel and Veg.   oxybutynin  (DITROPAN -XL) 5 MG 24 hr tablet Take 5 mg by mouth at bedtime.   PHENObarbital  (LUMINAL) 100 MG tablet Take 100 mg by mouth at bedtime.   polyvinyl alcohol  (LIQUIFILM TEARS) 1.4 % ophthalmic solution Place 1 drop into both eyes 2 (two) times daily. Artificial tears   RYBELSUS  7 MG  TABS Take 1 tablet by mouth daily.   Simethicone  180 MG CAPS Take 180 mg by mouth daily as needed (for gas).     Allergies  Allergen Reactions   Dilantin [Phenytoin] Hives and Other (See Comments)    Whelps all over for two weeks   Statins Other (See Comments)    High dose statin - dizziness    Social History   Socioeconomic History   Marital status: Widowed    Spouse name: Not on file   Number of children: Not on file   Years of education: Not on file   Highest education level: Not on file  Occupational History   Not on file  Tobacco Use   Smoking status: Never   Smokeless tobacco: Never   Tobacco comments:    Never smoked 05/14/23  Vaping Use   Vaping status: Never Used  Substance and Sexual Activity   Alcohol  use: No   Drug use: No   Sexual activity: Not Currently  Other Topics Concern   Not on file  Social History Narrative   Not on file   Social Drivers of Health   Financial Resource Strain: Not on file  Food Insecurity: No Food Insecurity (04/06/2023)   Hunger Vital Sign    Worried About Running Out of Food in the Last Year: Never true    Ran Out of Food in the Last Year: Never true  Transportation Needs: No Transportation Needs (04/06/2023)   PRAPARE - Administrator, Civil Service (Medical): No    Lack of Transportation (Non-Medical): No  Physical Activity: Not on file  Stress: Not on file  Social Connections: Not on file  Intimate Partner Violence: Not At Risk (04/06/2023)   Humiliation, Afraid, Rape, and Kick questionnaire    Fear of Current or Ex-Partner: No    Emotionally Abused: No    Physically Abused: No    Sexually Abused: No     Review of Systems: General: negative for chills, fever, night sweats or weight changes.  Cardiovascular: negative for chest pain, dyspnea on exertion, edema, orthopnea, palpitations, paroxysmal nocturnal dyspnea or shortness of breath Dermatological: negative for rash Respiratory: negative for cough or  wheezing Urologic: negative for hematuria Abdominal: negative for nausea, vomiting, diarrhea, bright red blood per rectum, melena, or hematemesis Neurologic: negative for visual changes, syncope, or dizziness All other systems reviewed and are otherwise negative except as noted above.    Blood pressure 138/76, pulse 83, height 5' (1.524 m), weight 165 lb 6.4 oz (75 kg), SpO2 96%.  General appearance: alert and no distress Neck: no adenopathy, no JVD, supple, symmetrical, trachea midline, thyroid not enlarged, symmetric, no tenderness/mass/nodules, and bilateral carotid bruits versus transmitted murmur Lungs: clear to auscultation bilaterally Heart: 2/6 outflow tract murmur consistent with aortic stenosis Extremities: extremities normal, atraumatic, no cyanosis or edema Pulses: 2+ and symmetric Skin: Skin color, texture, turgor normal. No rashes or lesions Neurologic: Grossly normal  EKG not performed today  ASSESSMENT AND PLAN:   PAF (paroxysmal atrial fibrillation) (HCC) History of PAF status post A-fib ablation by Dr. Arlester Ladd 09/15/2023 with an excellent result.  She feels clinically improved.  She is maintaining sinus rhythm on Coumadin  anticoagulation.  Essential hypertension History of essential hypertension with blood pressure measured today 138/76.  She is on amlodipine , and losartan .  Dyslipidemia, goal LDL below 70 History of dyslipidemia on statin therapy with lipid profile performed/3/25 revealing total cholesterol of 142, LDL 76 and HDL 47.  Aortic stenosis, moderate History of moderate aortic stenosis by 2D echo performed 10/06/2023 with valve area of 1 cm and a mean gradient of 18 mmHg.  This was not changed from prior study.  Will repeat this on an annual basis.  Elevated coronary artery calcium  score Coronary calcium  score performed 06/18/2023 was 191.  She is asymptomatic and at goal for secondary prevention.     Avanell Leigh MD FACP,FACC,FAHA,  [facility] 01/03/2024 3:46 PM

## 2024-01-03 NOTE — Patient Instructions (Signed)
 Medication Instructions:  Your physician recommends that you continue on your current medications as directed. Please refer to the Current Medication list given to you today.  *If you need a refill on your cardiac medications before your next appointment, please call your pharmacy*  Testing/Procedures: Your physician has requested that you have a carotid duplex. This test is an ultrasound of the carotid arteries in your neck. It looks at blood flow through these arteries that supply the brain with blood. Allow one hour for this exam. There are no restrictions or special instructions. This will take place at [address] 4th floor  Please note: We ask at that you not bring children with you during ultrasound (echo/ vascular) testing. Due to room size and safety concerns, children are not allowed in the ultrasound rooms during exams. Our front office staff cannot provide observation of children in our lobby area while testing is being conducted. An adult accompanying a patient to their appointment will only be allowed in the ultrasound room at the discretion of the ultrasound technician under special circumstances. We apologize for any inconvenience.   Your physician has requested that you have an echocardiogram. Echocardiography is a painless test that uses sound waves to create images of your heart. It provides your doctor with information about the size and shape of your heart and how well your heart's chambers and valves are working. This procedure takes approximately one hour. There are no restrictions for this procedure. Please do NOT wear cologne, perfume, aftershave, or lotions (deodorant is allowed). Please arrive 15 minutes prior to your appointment time.  Please note: We ask at that you not bring children with you during ultrasound (echo/ vascular) testing. Due to room size and safety concerns, children are not allowed in the ultrasound rooms during exams. Our front office staff cannot  provide observation of children in our lobby area while testing is being conducted. An adult accompanying a patient to their appointment will only be allowed in the ultrasound room at the discretion of the ultrasound technician under special circumstances. We apologize for any inconvenience. **To do in February 2026**   Follow-Up: At [facility], you and your health needs are our priority.  As part of our continuing mission to provide you with exceptional heart care, our providers are all part of one team.  This team includes your primary Cardiologist (physician) and Advanced Practice Providers or APPs (Physician Assistants and Nurse Practitioners) who all work together to provide you with the care you need, when you need it.  Your next appointment:   12 month(s)  Provider:   Lauro Portal, MD    We recommend signing up for the patient portal called "MyChart".  Sign up information is provided on this After Visit Summary.  MyChart is used to connect with patients for Virtual Visits (Telemedicine).  Patients are able to view lab/test results, encounter notes, upcoming appointments, etc.  Non-urgent messages can be sent to your provider as well.   To learn more about what you can do with MyChart, go to [email or web address].

## 2024-01-03 NOTE — Assessment & Plan Note (Signed)
 History of essential hypertension with blood pressure measured today 138/76.  She is on amlodipine , and losartan .

## 2024-01-03 NOTE — Assessment & Plan Note (Signed)
 History of moderate aortic stenosis by 2D echo performed 10/06/2023 with valve area of 1 cm and a mean gradient of 18 mmHg.  This was not changed from prior study.  Will repeat this on an annual basis.

## 2024-01-03 NOTE — Assessment & Plan Note (Signed)
 History of PAF status post A-fib ablation by Dr. Arlester Ladd 09/15/2023 with an excellent result.  She feels clinically improved.  She is maintaining sinus rhythm on Coumadin  anticoagulation.

## 2024-01-03 NOTE — Assessment & Plan Note (Signed)
 Coronary calcium  score performed 06/18/2023 was 191.  She is asymptomatic and at goal for secondary prevention.

## 2024-01-31 ENCOUNTER — Ambulatory Visit: Payer: Self-pay | Admitting: Cardiovascular Disease

## 2024-01-31 ENCOUNTER — Ambulatory Visit (HOSPITAL_COMMUNITY)
Admission: RE | Admit: 2024-01-31 | Discharge: 2024-01-31 | Disposition: A | Discharge status: Home / Self Care | Source: Ambulatory Visit | Attending: Cardiovascular Disease | Admitting: Cardiovascular Disease

## 2024-01-31 DIAGNOSIS — R0989 Other specified symptoms and signs involving the circulatory and respiratory systems: Secondary | ICD-10-CM | POA: Insufficient documentation

## 2024-02-07 ENCOUNTER — Telehealth: Payer: Self-pay | Admitting: Cardiovascular Disease

## 2024-02-07 NOTE — Telephone Encounter (Signed)
 Pt made aware of carotid findings. She verbalized understanding.

## 2024-02-07 NOTE — Telephone Encounter (Signed)
 Pt would like a c/b regarding recent test results. Please advise

## 2024-07-25 ENCOUNTER — Encounter: Payer: Self-pay | Admitting: Physician Assistant

## 2024-07-25 ENCOUNTER — Ambulatory Visit: Admitting: Physician Assistant

## 2024-07-25 VITALS — BP 138/91 | HR 79

## 2024-07-25 DIAGNOSIS — D485 Neoplasm of uncertain behavior of skin: Secondary | ICD-10-CM

## 2024-07-25 DIAGNOSIS — D489 Neoplasm of uncertain behavior, unspecified: Secondary | ICD-10-CM

## 2024-07-25 DIAGNOSIS — C510 Malignant neoplasm of labium majus: Secondary | ICD-10-CM | POA: Diagnosis not present

## 2024-07-25 DIAGNOSIS — L309 Dermatitis, unspecified: Secondary | ICD-10-CM

## 2024-07-25 NOTE — Patient Instructions (Addendum)

## 2024-07-25 NOTE — Progress Notes (Signed)
" ° °  New Patient Visit   Subjective  Julia Rose is a 78 y.o. female NEW PATIENT who presents for the following: spot check  Patient states she has skin lesion located at the groin area that she would like to have examined. Patient reports the areas have been there for 10 months. She reports the areas are bothersome. Patient rates irritation 7 out of 10.  Patient reports she has not previously been treated for these areas. Patient denies Hx of bx.   No history of skin cancer, abnormal pap smears and last exam by GYN was in 1999.    The following portions of the chart were reviewed this encounter and updated as appropriate: medications, allergies, medical history  Review of Systems:  No other skin or systemic complaints except as noted in HPI or Assessment and Plan.  Objective  Well appearing patient in no apparent distress; mood and affect are within normal limits.  A focused examination was performed of the following areas: genitalia   Relevant exam findings are noted in the Assessment and Plan.  Right Labium Majus 2.5 cm violaceous nodule right labia      Assessment & Plan    DERMATITIS UNSPECIFIED -- LABIA MINORA  - clinically this does not look like lichen sclerosis  - may need to do punch biopsy at later date  NEOPLASM OF UNCERTAIN BEHAVIOR Right Labium Majus - Skin / nail biopsy Type of biopsy: tangential   Informed consent: discussed and consent obtained   Timeout: patient name, date of birth, surgical site, and procedure verified   Procedure prep:  Patient was prepped and draped in usual sterile fashion Prep type:  Isopropyl alcohol  Anesthesia: the lesion was anesthetized in a standard fashion   Anesthetic:  1% lidocaine  w/ epinephrine  1-100,000 buffered w/ 8.4% NaHCO3 Instrument used: flexible razor blade   Hemostasis achieved with: pressure, aluminum chloride and electrodesiccation   Outcome: patient tolerated procedure well   Post-procedure details: sterile  dressing applied and wound care instructions given   Dressing type: bandage and petrolatum    Specimen 1 - Surgical pathology Differential Diagnosis: SCC VS PG   Check Margins: No DERMATITIS, UNSPECIFIED    Return for pending path .  I, Doyce Pan, CMA, am acting as scribe for Kiaraliz Rafuse K, PA-C.   Documentation: I have reviewed the above documentation for accuracy and completeness, and I agree with the above.  Jinnie Onley K, PA-C     "

## 2024-07-26 LAB — SURGICAL PATHOLOGY

## 2024-07-31 ENCOUNTER — Ambulatory Visit: Payer: Self-pay | Admitting: Physician Assistant

## 2024-07-31 DIAGNOSIS — C4492 Squamous cell carcinoma of skin, unspecified: Secondary | ICD-10-CM

## 2024-07-31 NOTE — Progress Notes (Signed)
 1. 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 This has been scheduled. I have contacted the patient.

## 2024-08-01 ENCOUNTER — Other Ambulatory Visit: Payer: Self-pay | Admitting: Cardiovascular Disease

## 2024-08-03 ENCOUNTER — Other Ambulatory Visit: Payer: Self-pay | Admitting: Physician Assistant

## 2024-08-03 ENCOUNTER — Ambulatory Visit (HOSPITAL_COMMUNITY)
Admission: RE | Admit: 2024-08-03 | Discharge: 2024-08-03 | Disposition: A | Discharge status: Home / Self Care | Source: Ambulatory Visit | Attending: Physician Assistant | Admitting: Physician Assistant

## 2024-08-03 DIAGNOSIS — K429 Umbilical hernia without obstruction or gangrene: Secondary | ICD-10-CM | POA: Diagnosis not present

## 2024-08-03 DIAGNOSIS — C519 Malignant neoplasm of vulva, unspecified: Secondary | ICD-10-CM | POA: Insufficient documentation

## 2024-08-03 DIAGNOSIS — C4492 Squamous cell carcinoma of skin, unspecified: Secondary | ICD-10-CM

## 2024-08-03 DIAGNOSIS — E119 Type 2 diabetes mellitus without complications: Secondary | ICD-10-CM | POA: Insufficient documentation

## 2024-08-03 DIAGNOSIS — R590 Localized enlarged lymph nodes: Secondary | ICD-10-CM | POA: Insufficient documentation

## 2024-08-03 LAB — POCT I-STAT CREATININE: Creatinine, Ser: 0.9 mg/dL (ref 0.44–1.00)

## 2024-08-03 MED ORDER — IOHEXOL 300 MG/ML  SOLN
100.0000 mL | Freq: Once | INTRAMUSCULAR | Status: AC | PRN
  Administered 2024-08-03: 100 mL via INTRAVENOUS

## 2024-08-07 ENCOUNTER — Ambulatory Visit: Payer: Self-pay | Admitting: Physician Assistant

## 2024-08-07 ENCOUNTER — Other Ambulatory Visit: Payer: Self-pay

## 2024-08-07 DIAGNOSIS — C4492 Squamous cell carcinoma of skin, unspecified: Secondary | ICD-10-CM

## 2024-08-07 NOTE — Progress Notes (Signed)
 PATIENT HAS BEEN NOTIFIED   IMPRESSION: 8 mm right inguinal lymph node is not considered pathologically enlarged, but shows low-attenuation centrally which may represent necrosis. Metastatic disease cannot be excluded. Consider further evaluation with PET-CT scan or tissue sampling.   No other sites of metastatic disease identified within the pelvis.

## 2024-08-08 ENCOUNTER — Other Ambulatory Visit: Payer: Self-pay | Admitting: Family Medicine

## 2024-08-08 DIAGNOSIS — Z1231 Encounter for screening mammogram for malignant neoplasm of breast: Secondary | ICD-10-CM

## 2024-08-09 ENCOUNTER — Telehealth: Payer: Self-pay

## 2024-08-09 ENCOUNTER — Encounter: Payer: Self-pay | Admitting: Gynecologic Oncology

## 2024-08-09 NOTE — Telephone Encounter (Signed)
 Spoke with Julia Rose regarding her referral to GYN oncology. She has an appointment scheduled with Dr. Viktoria on 08/11/24 at 10:30. Patient agrees to date and time. She has been provided with office address and location. She is also aware of our mask and visitor policy. Patient verbalized understanding and will call with any questions.

## 2024-08-10 NOTE — Progress Notes (Signed)
 GYNECOLOGIC ONCOLOGY NEW PATIENT CONSULTATION   Patient Name: Julia Rose  Patient Age: 79 y.o. 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  Discussed in detail with the patient to her recent diagnosis.  We reviewed her biopsy results together.  We discussed vulvar cancer and that this typically happens either as a result of HPV infection or in the setting of lichen Sclerosus.  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  I am suspicious that her squamous cell carcinoma arose in this setting.  Her lesion itself is about 1 cm from the midline on the right.  It is less than 2 cm but invaginate somewhat into the tissue and it is difficult to palpate but feels like there is some tumor below the skin surface.  We also reviewed her recent pelvic CT scan which shows no enlarged lymph nodes although 1 in her right groin that has some features that raise the concern for metastatic disease.  I recommended PET scan to assess for more distant disease and to look specifically at her groin lymph nodes.  We discussed that PET scan may be negative even if there is cancer spread to the abnormal appearing lymph node on CT scan given its size and that this may be below the threshold of the PET scan.  Although her lesion is small, I am suspicious that it will have more than 1 mm depth of invasion.  Because of this, I suggest that we proceed with surgical excision and lymph node evaluation.  Depending on the PET results, we would either proceed with bilateral sentinel lymph node biopsy or lymphadenectomy.  Discussed that sentinel lymph node biopsy is not a procedure that I can coordinate here at [facility].  The  patient is willing to come to [facility] for this surgery.  We were able to get her PET scan scheduled for next week and she is tentatively scheduled for surgery with me on 1/27.  She will see me in clinic there on 1/20 for a preoperative visit.  We will also plan to perform at least 1 or 2 biopsies of her vulva at the time of surgery to help confirm diagnosis of lichen sclerosus.  In the setting of her atrial fibrillation, the patient is on Coumadin  for anticoagulation.  We discussed need for this to be held preoperatively.  This is managed by her PCP.  I have asked my office to send surgical clearance to both the patient's PCP and cardiologist with alerts on her Coumadin  as this will need to be held starting early next week.  A copy of this note was sent to the patient's referring provider.   65 minutes of total time was spent for this patient encounter, including preparation, face-to-face counseling with the patient and coordination of care, and documentation of the encounter.  Comer Dollar, MD  Division of Gynecologic Oncology  Department of Obstetrics and Gynecology  [facility]  ___________________________________________  Chief Complaint: Chief Complaint  Patient presents with   Squamous cell carcinoma of vulva [facility])    History of Present Illness:  Julia Rose is a 79 y.o. y.o. female who is seen in consultation at the request of Erminio Like, PA-C for an evaluation  of vulvar cancer.  Patient saw dermatology at then end of December for an area in her groin that she'd noted for the last 10 months that is bothersome. 2.5 cm violaceous nodule noted. 07/25/24: Right labial biopsy shows moderately differentiated SCC.  08/03/24: CT pelvis - 8 mm right inguinal lymph node is not considered pathologically enlarged but shows low-attenuation centrally which may represent necrosis. Metastatic disease cannot be excluded. NO other concerning lymph nodes.  Small fat-containing umbilical hernia.  Patient comes in with her granddaughter today.  She notes overall doing well.  Noticed a spot on her vulva either in the winter 2024 or the spring 2025.  Thought this was a wart.  Did not realize it was getting bigger until recently.  Denies any associated symptoms including bleeding, pain, or discharge.  She is recovering from a virus and has had some diarrhea recently that she thinks is related to this.  Has history of urgency and urge incontinence if she waits too long to go to the bathroom.  Overall feels less urge to urinate.  Endorses a good appetite without any recent weight changes.  Has a history of atrial fibrillation.  Sees Dr. Court with cardiology.  Takes warfarin for anticoagulation as she prefers not to take medications that have been on the market less than 20-30 years.   PAST MEDICAL HISTORY:  Past Medical History:  Diagnosis Date   Arthritis    hands (04/06/2017)   Asthma    mild with exertion   Atrial fibrillation with RVR (HCC)    Chronic back pain    from the DDD   DDD (degenerative disc disease), cervical    C-5-6 and C6-7 are partially herniated   DDD (degenerative disc disease), lumbar    L4-5 and L5-6  and S-1 are herniated discs   DDD (degenerative disc disease), thoracic    not dx'd yet (04/06/2017)   Hyperlipidemia    statin intolerant   Hypertension    Moderate aortic stenosis    Right bundle branch block    Seizures (HCC)    when I was younger; none since the 1980s (04/06/2017)   Type II diabetes mellitus (HCC)      PAST SURGICAL HISTORY:  Past Surgical History:  Procedure Laterality Date   ATRIAL FIBRILLATION ABLATION N/A 09/15/2023   Procedure: ATRIAL FIBRILLATION ABLATION;  Surgeon: Nancey Eulas BRAVO, MD;  Location: [facility] INVASIVE CV LAB;  Service: Cardiovascular;  Laterality: N/A;   CARDIOVERSION N/A 12/17/2016   Procedure: CARDIOVERSION;  Surgeon: Alveta Aleene PARAS, MD;  Location: [facility] ENDOSCOPY;   Service: Cardiovascular;  Laterality: N/A;   DIAGNOSTIC LAPAROSCOPY  1993   paraovarian cyst removed   INCISIONAL HERNIA REPAIR N/A 05/29/2021   Procedure: LAPAROSCOPIC INCISIONAL HERNIA REPAIR WITH MESH;  Surgeon: Tanda Locus, MD;  Location: [facility] ORS;  Service: General;  Laterality: N/A;   TONSILLECTOMY     VAGINAL HYSTERECTOMY  1999   removed uterus and cervix    OB/GYN HISTORY:  OB History  Gravida Para Term Preterm AB Living  6 4    4   SAB IAB Ectopic Multiple Live Births          # Outcome Date GA Lbr Len/2nd Weight Sex Type Anes PTL Lv  6 Gravida           5 Gravida           4 Para           3 Para  2 Para           1 Para             No LMP recorded. Patient has had a hysterectomy.  Age at menarche: 22  Age at menopause: 26 Hx of HRT: denies Hx of STDs: denies Last pap: 2005 History of abnormal pap smears: denies  SCREENING STUDIES:  Last mammogram: 2025  Last colonoscopy: n/a Last bone mineral density: 2021  MEDICATIONS: Outpatient Encounter Medications as of 08/11/2024  Medication Sig   acetaminophen  (TYLENOL  8 HOUR ARTHRITIS PAIN) 650 MG CR tablet Take 650 mg by mouth 3 (three) times daily. Increase if needed   albuterol  (PROVENTIL  HFA;VENTOLIN  HFA) 108 (90 Base) MCG/ACT inhaler Inhale 2 puffs into the lungs every 6 (six) hours as needed for wheezing or shortness of breath.   amLODipine  (NORVASC ) 5 MG tablet Take 5 mg by mouth daily.   atorvastatin  (LIPITOR) 40 MG tablet Take 1 tablet (40 mg total) by mouth daily.   ibuprofen (ADVIL) 200 MG tablet Take 400 mg by mouth daily as needed for moderate pain (pain score 4-6). Taking 1-4 tablets by mouth as needed   lidocaine  (LMX) 4 % cream Apply 1 Application topically daily as needed (Pain).   losartan  (COZAAR ) 50 MG tablet Take 50 mg by mouth daily.   metFORMIN  (GLUCOPHAGE ) 1000 MG tablet Take 500-1,000 mg by mouth See admin instructions. Taking 1000 mg in the am and 500mg  in the evening with meals    Nutritional Supplements (JUICE PLUS FIBRE PO) Take 3 capsules by mouth in the morning and at bedtime. Fruit, Berry and Veg.   oxybutynin  (DITROPAN -XL) 5 MG 24 hr tablet Take 5 mg by mouth at bedtime.   PHENObarbital  (LUMINAL) 100 MG tablet Take 100 mg by mouth at bedtime.   polyvinyl alcohol  (LIQUIFILM TEARS) 1.4 % ophthalmic solution Place 1 drop into both eyes 2 (two) times daily. Artificial tears   Simethicone  180 MG CAPS Take 180 mg by mouth daily as needed (for gas).   [DISCONTINUED] amLODipine  (NORVASC ) 2.5 MG tablet TAKE 1 TABLET BY MOUTH DAILY   [DISCONTINUED] warfarin (COUMADIN ) 5 MG tablet Take 1 tablet (5 mg total) by mouth daily. Taking 7.5mg  on Tuesday, Wednesday, and 10 mg on Thursday   [DISCONTINUED] losartan  (COZAAR ) 25 MG tablet Take 25 mg by mouth daily.   [DISCONTINUED] RYBELSUS  7 MG TABS Take 1 tablet by mouth daily. (Patient not taking: Reported on 07/25/2024)   No facility-administered encounter medications on file as of 08/11/2024.    ALLERGIES:  Allergies[1]   FAMILY HISTORY:  Family History  Problem Relation Age of Onset   Kidney disease Mother    Diabetes Mother    Dementia Mother    Hypertension Mother      SOCIAL HISTORY:  Social Connections: Not on file    REVIEW OF SYSTEMS:  Denies appetite changes, fevers, chills, fatigue, unexplained weight changes. Denies hearing loss, neck lumps or masses, mouth sores, ringing in ears or voice changes. Denies cough or wheezing.  Denies shortness of breath. Denies chest pain or palpitations. Denies leg swelling. Denies abdominal distention, pain, blood in stools, constipation, diarrhea, nausea, vomiting, or early satiety. Denies pain with intercourse, dysuria, frequency, hematuria or incontinence. Denies hot flashes, pelvic pain, vaginal bleeding or vaginal discharge.   Denies joint pain, back pain or muscle pain/cramps. Denies itching, rash, or wounds. Denies dizziness, headaches, numbness or seizures. Denies  swollen lymph nodes or glands, denies easy bruising or bleeding. Denies anxiety, depression,  confusion, or decreased concentration.  Physical Exam:  Vital Signs for this encounter:  Blood pressure (!) 144/84, pulse 79, temperature 98.2 F (36.8 C), temperature source Oral, resp. rate 19, height 5' (1.524 m), weight 172 lb 9.6 oz (78.3 kg), SpO2 97%. Body mass index is 33.71 kg/m. General: Alert, oriented, no acute distress.  HEENT: Normocephalic, atraumatic. Sclera anicteric.  Chest: Clear to auscultation bilaterally. No wheezes, rhonchi, or rales. Cardiovascular: Regular rate and rhythm, no murmurs, rubs, or gallops.  Abdomen: Obese. Normoactive bowel sounds. Soft, nondistended, nontender to palpation. No masses or hepatosplenomegaly appreciated. No palpable fluid wave.  Extremities: Grossly normal range of motion. Warm, well perfused. No edema bilaterally.  Skin: No rashes or lesions.  Lymphatics: No cervical, supraclavicular, or inguinal adenopathy.  GU:  Female genitalia notable for loss of architecture of the vulva with cigarette paper appearing skin of bilateral labia and around the anus.  Labia minora completely absent.  Superior to the clitoris and approximately 1 cm right of midline, there is a 1 x 1.5 cm ulcerated lesion.  No other vulvar lesions noted.                       Vagina: Moderately atrophic, no lesions.             Cervix/uterus: surgically absent.             Adnexa: No masses appreciated.  Rectal: Deferred.  LABORATORY AND RADIOLOGIC DATA:  Outside medical records were reviewed to synthesize the above history, along with the history and physical obtained during the visit.   Lab Results  Component Value Date   WBC 9.2 11/26/2023   HGB 14.0 11/26/2023   HCT 41.3 11/26/2023   PLT 280 11/26/2023   GLUCOSE 161 (H) 11/26/2023   CHOL 142 10/28/2023   TRIG 102 10/28/2023   HDL 47 10/28/2023   LDLCALC 76 10/28/2023   ALT 24 10/28/2023   AST 28 10/28/2023   NA 141  11/26/2023   K 3.9 11/26/2023   CL 104 11/26/2023   CREATININE 0.90 08/03/2024   BUN 8 11/26/2023   CO2 24 11/26/2023   INR 2.1 (H) 11/26/2023   HGBA1C 6.4 (H) 05/14/2021       [1]  Allergies Allergen Reactions   Dilantin [Phenytoin] Hives and Other (See Comments)    Whelps all over for two weeks   Semaglutide      rybelsus    Statins Other (See Comments)    High dose statin - dizziness

## 2024-08-11 ENCOUNTER — Telehealth: Payer: Self-pay | Admitting: *Deleted

## 2024-08-11 ENCOUNTER — Encounter: Payer: Self-pay | Admitting: Gynecologic Oncology

## 2024-08-11 ENCOUNTER — Inpatient Hospital Stay

## 2024-08-11 ENCOUNTER — Inpatient Hospital Stay: Attending: Gynecologic Oncology | Admitting: Gynecologic Oncology

## 2024-08-11 ENCOUNTER — Telehealth: Payer: Self-pay

## 2024-08-11 VITALS — BP 144/84 | HR 79 | Temp 98.2°F | Resp 19 | Ht 60.0 in | Wt 172.6 lb

## 2024-08-11 DIAGNOSIS — Z79899 Other long term (current) drug therapy: Secondary | ICD-10-CM | POA: Insufficient documentation

## 2024-08-11 DIAGNOSIS — L900 Lichen sclerosus et atrophicus: Secondary | ICD-10-CM | POA: Diagnosis not present

## 2024-08-11 DIAGNOSIS — E119 Type 2 diabetes mellitus without complications: Secondary | ICD-10-CM | POA: Insufficient documentation

## 2024-08-11 DIAGNOSIS — J45909 Unspecified asthma, uncomplicated: Secondary | ICD-10-CM | POA: Insufficient documentation

## 2024-08-11 DIAGNOSIS — C519 Malignant neoplasm of vulva, unspecified: Secondary | ICD-10-CM | POA: Diagnosis not present

## 2024-08-11 DIAGNOSIS — D6869 Other thrombophilia: Secondary | ICD-10-CM

## 2024-08-11 DIAGNOSIS — I4891 Unspecified atrial fibrillation: Secondary | ICD-10-CM | POA: Insufficient documentation

## 2024-08-11 DIAGNOSIS — G8929 Other chronic pain: Secondary | ICD-10-CM | POA: Insufficient documentation

## 2024-08-11 DIAGNOSIS — Z7984 Long term (current) use of oral hypoglycemic drugs: Secondary | ICD-10-CM | POA: Insufficient documentation

## 2024-08-11 DIAGNOSIS — E785 Hyperlipidemia, unspecified: Secondary | ICD-10-CM | POA: Insufficient documentation

## 2024-08-11 DIAGNOSIS — I10 Essential (primary) hypertension: Secondary | ICD-10-CM | POA: Insufficient documentation

## 2024-08-11 DIAGNOSIS — I480 Paroxysmal atrial fibrillation: Secondary | ICD-10-CM

## 2024-08-11 DIAGNOSIS — I350 Nonrheumatic aortic (valve) stenosis: Secondary | ICD-10-CM | POA: Insufficient documentation

## 2024-08-11 DIAGNOSIS — M1990 Unspecified osteoarthritis, unspecified site: Secondary | ICD-10-CM | POA: Insufficient documentation

## 2024-08-11 NOTE — Telephone Encounter (Addendum)
 Per Dr Viktoria fax surgical optimization form to the patient's PCP office (Dr Loring [phone]) and the cardiology office (Dr Court [phone])  surgery to be on 1/27 at [facility]

## 2024-08-11 NOTE — Telephone Encounter (Signed)
 Hi Dr. Court. Patient has an upcoming partial vulvectomy and lymph node dissection for vulva carcinoma scheduled for 08/22/2024. We will certainly arrange a virtual visit to make sure she is stable from a cardiac standpoint. However, she has an Echo scheduled for 09/04/2024 for routine monitoring of her moderate aortic stenosis. Do you feel like she needs this prior to her surgery or can we keep it as scheduled?  Please route response back to P CV DIV PREOP.  Thank you! Julia Rose

## 2024-08-11 NOTE — Patient Instructions (Addendum)
 It was very nice to meet you today.  You have a small lesion on the vulva that showed a squamous cell carcinoma.  This is a type of skin cancer.  This is sometimes related to HPV and sometimes related to a chronic skin condition called lichen sclerosis.  On exam, you have findings that suggest lichen sclerosis.  We will plan to biopsy this as well at the time of your surgery because this is something that we can help treat if it is causing you symptoms like itching.  My office has scheduled you for a PET scan next week.  This will hopefully help us  to confirm whether there are any suspicious lymph nodes for cancer spread. FOR THE PET SCAN, NOTHING TO EAT OR DRINK 6 HOURS BEFORE.    I am also working with my office at [facility] to get you scheduled for a preoperative visit next week and surgery on January 27.  This is a surgery to remove the cancer that was biopsied and to hopefully remove just 1 lymph node, called a sentinel lymph node, from each groin.

## 2024-08-11 NOTE — Telephone Encounter (Signed)
"  ° °  Pre-operative Risk Assessment    Patient Name: Julia Rose  DOB: [birth year] MRN: [id number]   Date of last office visit: 01/03/2024, Dr. Dorn Lesches, MD Date of next office visit: NONE   Request for Surgical Clearance    Procedure:  radical partial vulvectomy, bilateral inguinofemoral sentinel lymph node box, full lymph node dissection   Date of Surgery:  Clearance 08/22/24                                Surgeon: Dr. Comer Dollar, MD Surgeon's Group or Practice Name: [facility]  Phone number: [phone] Fax number: [phone]   Type of Clearance Requested:   - Medical    Type of Anesthesia:  General    Additional requests/questions:    Bonney Asberry KANDICE Ethelene   08/11/2024, 12:41 PM   "

## 2024-08-12 NOTE — Telephone Encounter (Signed)
" ° °  Name: Julia Rose  DOB: [birth year]  MRN: [id number]  Primary Cardiologist: Dorn Lesches, MD   Preoperative team, please contact this patient and set up a phone call appointment for further preoperative risk assessment. Please obtain consent and complete medication review. Thank you for your help.  I confirm that guidance regarding antiplatelet and oral anticoagulation therapy has been completed and, if necessary, noted below.  I also confirmed the patient resides in the state of [place] . As per [facility] Medical Board telemedicine laws, the patient must reside in the state in which the provider is licensed.   Lamarr Satterfield, NP 08/12/2024, 1:28 PM [facility]    "

## 2024-08-14 ENCOUNTER — Encounter: Payer: Self-pay | Admitting: Oncology

## 2024-08-14 ENCOUNTER — Telehealth (HOSPITAL_BASED_OUTPATIENT_CLINIC_OR_DEPARTMENT_OTHER): Payer: Self-pay | Admitting: *Deleted

## 2024-08-14 NOTE — Progress Notes (Signed)
 Faxed request to [facility] for PD-L1 testing on accession [phone] per Dr. Viktoria.

## 2024-08-14 NOTE — Telephone Encounter (Signed)
 Pt has been schedule tele preop appt 08/16/24, med rec and consent are done.   Pt tells me she is on warfarin, that she said Dr. Court prescribed originally; however her PCP manages now. I have added warfarin to her med list. I will also update the preop app to the new information about the warfarin.     Patient Consent for Virtual Visit        Julia Rose has provided verbal consent on 08/14/2024 for a virtual visit (video or telephone).   CONSENT FOR VIRTUAL VISIT FOR:  Julia Rose  By participating in this virtual visit I agree to the following:  I hereby voluntarily request, consent and authorize [facility] and its employed or contracted physicians, physician assistants, nurse practitioners or other licensed health care professionals (the Practitioner), to provide me with telemedicine health care services (the Services) as deemed necessary by the treating Practitioner. I acknowledge and consent to receive the Services by the Practitioner via telemedicine. I understand that the telemedicine visit will involve communicating with the Practitioner through live audiovisual communication technology and the disclosure of certain medical information by electronic transmission. I acknowledge that I have been given the opportunity to request an in-person assessment or other available alternative prior to the telemedicine visit and am voluntarily participating in the telemedicine visit.  I understand that I have the right to withhold or withdraw my consent to the use of telemedicine in the course of my care at any time, without affecting my right to future care or treatment, and that the Practitioner or I may terminate the telemedicine visit at any time. I understand that I have the right to inspect all information obtained and/or recorded in the course of the telemedicine visit and may receive copies of available information for a reasonable fee.  I understand that some of the potential  risks of receiving the Services via telemedicine include:  Delay or interruption in medical evaluation due to technological equipment failure or disruption; Information transmitted may not be sufficient (e.g. poor resolution of images) to allow for appropriate medical decision making by the Practitioner; and/or  In rare instances, security protocols could fail, causing a breach of personal health information.  Furthermore, I acknowledge that it is my responsibility to provide information about my medical history, conditions and care that is complete and accurate to the best of my ability. I acknowledge that Practitioner's advice, recommendations, and/or decision may be based on factors not within their control, such as incomplete or inaccurate data provided by me or distortions of diagnostic images or specimens that may result from electronic transmissions. I understand that the practice of medicine is not an exact science and that Practitioner makes no warranties or guarantees regarding treatment outcomes. I acknowledge that a copy of this consent can be made available to me via my patient portal [facility] MyChart), or I can request a printed copy by calling the office of [facility].    I understand that my insurance will be billed for this visit.   I have read or had this consent read to me. I understand the contents of this consent, which adequately explains the benefits and risks of the Services being provided via telemedicine.  I have been provided ample opportunity to ask questions regarding this consent and the Services and have had my questions answered to my satisfaction. I give my informed consent for the services to be provided through the use of telemedicine in my medical care

## 2024-08-14 NOTE — Telephone Encounter (Signed)
 Pt has been schedule tele preop appt 08/16/24, med rec and consent are done.   Pt tells me she is on warfarin, that she said Dr. Court prescribed originally; however her PCP manages now. I have added warfarin to her med list. I will also update the preop app to the new information about the warfarin.

## 2024-08-15 LAB — CYTOLOGY - PAP
Comment: NEGATIVE
Diagnosis: NEGATIVE
High risk HPV: NEGATIVE

## 2024-08-15 NOTE — Telephone Encounter (Signed)
 Received PCP clearance and fax to [facility]

## 2024-08-16 ENCOUNTER — Encounter: Payer: Self-pay | Admitting: Family Medicine

## 2024-08-16 ENCOUNTER — Ambulatory Visit: Payer: Self-pay | Admitting: *Deleted

## 2024-08-16 ENCOUNTER — Encounter
Admission: RE | Admit: 2024-08-16 | Discharge: 2024-08-16 | Disposition: A | Discharge status: Home / Self Care | Source: Ambulatory Visit | Attending: Gynecologic Oncology | Admitting: Gynecologic Oncology

## 2024-08-16 ENCOUNTER — Ambulatory Visit: Attending: Cardiology

## 2024-08-16 DIAGNOSIS — Z01810 Encounter for preprocedural cardiovascular examination: Secondary | ICD-10-CM | POA: Diagnosis not present

## 2024-08-16 DIAGNOSIS — C519 Malignant neoplasm of vulva, unspecified: Secondary | ICD-10-CM | POA: Diagnosis present

## 2024-08-16 DIAGNOSIS — Z01818 Encounter for other preprocedural examination: Secondary | ICD-10-CM

## 2024-08-16 LAB — GLUCOSE, CAPILLARY: Glucose-Capillary: 133 mg/dL — ABNORMAL HIGH (ref 70–99)

## 2024-08-16 MED ORDER — FLUDEOXYGLUCOSE F - 18 (FDG) INJECTION
9.4000 | Freq: Once | INTRAVENOUS | Status: AC | PRN
  Administered 2024-08-16: 9.4 via INTRAVENOUS

## 2024-08-16 NOTE — Telephone Encounter (Signed)
 Spoke with Ms. Jenkin and relayed result message from provider that her Pap and HPV are normal and negative. Pt responded, great news & thank you for calling.

## 2024-08-16 NOTE — Telephone Encounter (Signed)
 Received cardio clearance and fax to [facility]

## 2024-08-16 NOTE — Progress Notes (Signed)
 "   Virtual Visit via Telephone Note   Because of Julia Rose co-morbid illnesses, she is at least at moderate risk for complications without adequate follow up.  This format is felt to be most appropriate for this patient at this time.  Due to technical limitations with video connection (technology), today's appointment will be conducted as an audio only telehealth visit, and Julia Rose verbally agreed to proceed in this manner.   All issues noted in this document were discussed and addressed.  No physical exam could be performed with this format.  Evaluation Performed:  Preoperative cardiovascular risk assessment _____________   Date:  08/16/2024   Patient ID:  Julia Rose, DOB 1945/07/28, MRN [id number] Patient Location:  Home Provider location:   Office  Primary Care Provider:  Loring Tanda Mae, MD Primary Cardiologist:  Dorn Lesches, MD  Chief Complaint / Patient Profile   79 y.o. y/o female with a h/o a-fib s/p ablation, CAD with low risk cardiac PET 06/2023, aortic stenosis, hypertension, T2DM, hyperlipidemia,  who is pending radical partial vulvectomy, bilateral inguinofemoral sentinel lymph node box, and full lymph node dissection and presents today for telephonic preoperative cardiovascular risk assessment.  History of Present Illness    Julia Rose is a 79 y.o. female who presents via audio/video conferencing for a telehealth visit today.  Pt was last seen in cardiology clinic on 01/03/2024 by Dr. Lesches.  At that time Julia Rose was doing well.  The patient is now pending procedure as outlined above. Since her last visit, she is able to do the ADL's around the house. She notes chest tightness with strenuous activity that has been stable since prior to her ablation. She has low risk cardiac PET 06/2024.  She denies chest pain, shortness of breath, lower extremity edema, fatigue, palpitations, melena, hematuria, hemoptysis, diaphoresis, weakness, presyncope,  syncope, orthopnea, and PND.  Past Medical History    Past Medical History:  Diagnosis Date   Arthritis    hands (04/06/2017)   Asthma    mild with exertion   Atrial fibrillation with RVR (HCC)    Chronic back pain    from the DDD   DDD (degenerative disc disease), cervical    C-5-6 and C6-7 are partially herniated   DDD (degenerative disc disease), lumbar    L4-5 and L5-6  and S-1 are herniated discs   DDD (degenerative disc disease), thoracic    not dx'd yet (04/06/2017)   Hyperlipidemia    statin intolerant   Hypertension    Moderate aortic stenosis    Right bundle branch block    Seizures (HCC)    when I was younger; none since the 1980s (04/06/2017)   Type II diabetes mellitus (HCC)    Past Surgical History:  Procedure Laterality Date   ATRIAL FIBRILLATION ABLATION N/A 09/15/2023   Procedure: ATRIAL FIBRILLATION ABLATION;  Surgeon: Nancey Eulas BRAVO, MD;  Location: [facility] INVASIVE CV LAB;  Service: Cardiovascular;  Laterality: N/A;   CARDIOVERSION N/A 12/17/2016   Procedure: CARDIOVERSION;  Surgeon: Alveta Aleene PARAS, MD;  Location: [facility] ENDOSCOPY;  Service: Cardiovascular;  Laterality: N/A;   DIAGNOSTIC LAPAROSCOPY  1993   paraovarian cyst removed   INCISIONAL HERNIA REPAIR N/A 05/29/2021   Procedure: LAPAROSCOPIC INCISIONAL HERNIA REPAIR WITH MESH;  Surgeon: Tanda Locus, MD;  Location: [facility] ORS;  Service: General;  Laterality: N/A;   TONSILLECTOMY     VAGINAL HYSTERECTOMY  1999   removed uterus and cervix    Allergies  Allergies[1]  Home Medications    Prior to Admission medications  Medication Sig Start Date End Date Taking? Authorizing Provider  acetaminophen  (TYLENOL  8 HOUR ARTHRITIS PAIN) 650 MG CR tablet Take 650 mg by mouth 3 (three) times daily. Increase if needed    [provider]  albuterol  (PROVENTIL  HFA;VENTOLIN  HFA) 108 (90 Base) MCG/ACT inhaler Inhale 2 puffs into the lungs every 6 (six) hours as needed for wheezing or shortness of  breath.    [provider]  amLODipine  (NORVASC ) 5 MG tablet Take 5 mg by mouth daily. 08/02/24   [provider]  atorvastatin  (LIPITOR) 40 MG tablet Take 1 tablet (40 mg total) by mouth daily. 07/30/23 08/14/24  Court Dorn PARAS, MD  dextromethorphan (DELSYM) 30 MG/5ML liquid Take by mouth daily.    [provider]  ibuprofen (ADVIL) 200 MG tablet Take 400 mg by mouth daily as needed for moderate pain (pain score 4-6). Taking 1-4 tablets by mouth as needed    [provider]  lidocaine  (LMX) 4 % cream Apply 1 Application topically daily as needed (Pain).    [provider]  losartan  (COZAAR ) 50 MG tablet Take 50 mg by mouth daily. 07/28/24   [provider]  metFORMIN  (GLUCOPHAGE ) 1000 MG tablet Take 500-1,000 mg by mouth See admin instructions. Taking 1000 mg in the am and 500mg  in the evening with meals Patient taking differently: Take 1,000 mg by mouth 2 (two) times daily with a meal.    [provider]  Nutritional Supplements (JUICE PLUS FIBRE PO) Take 3 capsules by mouth in the morning and at bedtime. Fruit, Berry and [employer].    [provider]  oxybutynin  (DITROPAN -XL) 5 MG 24 hr tablet Take 5 mg by mouth at bedtime. Patient taking differently: Take 10 mg by mouth at bedtime. 01/18/23   [provider]  PHENObarbital  (LUMINAL) 100 MG tablet Take 100 mg by mouth at bedtime. 06/13/15   [provider]  polyvinyl alcohol  (LIQUIFILM TEARS) 1.4 % ophthalmic solution Place 1 drop into both eyes 2 (two) times daily. Artificial tears    [provider]  Simethicone  180 MG CAPS Take 180 mg by mouth daily as needed (for gas).    [provider]  warfarin (COUMADIN ) 5 MG tablet Take 5 mg by mouth daily. Patient taking differently: Take 5 mg by mouth daily. AS DIRECTED, PT STATES PCP MANAGES    [provider]    Physical Exam    Vital Signs:  Julia Rose does not have vital signs available  for review today.  Given telephonic nature of communication, physical exam is limited. AAOx3. NAD. Normal affect.  Speech and respirations are unlabored.  Accessory Clinical Findings    None  Assessment & Plan    1.  Preoperative Cardiovascular Risk Assessment: According to the Revised Cardiac Risk Index (RCRI), her Perioperative Risk of Major Cardiac Event is (%): 0.4 Her Functional Capacity in METs is: 4.64 according to the Duke Activity Status Index (DASI). Per AHA/ACC guidelines, she is deemed acceptable risk for the planned procedure without additional cardiovascular testing. Will route to surgical team so they are aware.   The patient was advised that if she develops new symptoms prior to surgery to contact our office to arrange for a follow-up visit, and she verbalized understanding.  A copy of this note will be routed to requesting surgeon.  Time:   Today, I have spent 10 minutes with the patient with telehealth technology discussing  medical history, symptoms, and management plan.     Julia KATHEE Pizza, FNP  08/16/2024, 3:26 PM     [1]  Allergies Allergen Reactions   Dilantin [Phenytoin] Hives and Other (See Comments)    Whelps all over for two weeks   Semaglutide      rybelsus    Statins Other (See Comments)    High dose statin - dizziness   "

## 2024-08-16 NOTE — Telephone Encounter (Signed)
-----   Message from Comer Dollar, MD sent at 08/16/2024  5:05 AM EST ----- Regarding: FW: Please let the patient know Pap and HPV are normal/negative

## 2024-08-16 NOTE — Telephone Encounter (Signed)
 Attempted to reach patient to relay result message from provider. Left voicemail requesting call back to [phone].

## 2024-08-17 ENCOUNTER — Other Ambulatory Visit: Payer: Self-pay | Admitting: Cardiovascular Disease

## 2024-08-23 NOTE — Progress Notes (Signed)
 Per Julia Rose at [facility], the PD-L1 testing is in process.  They will fax the results to the cancer center when available.

## 2024-08-24 NOTE — Telephone Encounter (Signed)
 Lipids Completed 10/28/23

## 2024-08-29 ENCOUNTER — Telehealth: Payer: Self-pay | Admitting: *Deleted

## 2024-08-29 ENCOUNTER — Encounter: Payer: Self-pay | Admitting: Gynecologic Oncology

## 2024-08-29 ENCOUNTER — Inpatient Hospital Stay: Admitting: Gynecologic Oncology

## 2024-08-29 NOTE — Telephone Encounter (Signed)
 Spoke with Julia Rose who notified the office to schedule a post op visit with Dr. Viktoria. Patient had surgery at [facility] on 1/27. Pt also states she will not be going back to [facility].   Patient has been scheduled for a post op appointment with Eleanor Epps, NP on Friday, 09/22/24 at 1 pm. Pt agreed to date and time.   Patient also states she has a concern with her surgical drain? Advised patient to call [facility] for advise. Pt states she will call [facility].

## 2024-09-04 ENCOUNTER — Ambulatory Visit (HOSPITAL_COMMUNITY)

## 2024-09-05 ENCOUNTER — Inpatient Hospital Stay

## 2024-09-06 ENCOUNTER — Inpatient Hospital Stay

## 2024-09-22 ENCOUNTER — Inpatient Hospital Stay: Admitting: Gynecologic Oncology

## 2024-09-22 ENCOUNTER — Ambulatory Visit

## 2024-09-26 ENCOUNTER — Ambulatory Visit
